# Patient Record
Sex: Male | Born: 1990 | Race: White | Hispanic: No | Marital: Single | Smoking: Never smoker
Health system: Southern US, Community
[De-identification: ages and names within clinical notes are randomized; demographics above are authoritative.]

## PROBLEM LIST (undated history)

## (undated) DIAGNOSIS — M545 Low back pain: Secondary | ICD-10-CM

## (undated) DIAGNOSIS — M47814 Spondylosis without myelopathy or radiculopathy, thoracic region: Secondary | ICD-10-CM

## (undated) DIAGNOSIS — J310 Chronic rhinitis: Secondary | ICD-10-CM

## (undated) DIAGNOSIS — M7918 Myalgia, other site: Secondary | ICD-10-CM

## (undated) DIAGNOSIS — L83 Acanthosis nigricans: Secondary | ICD-10-CM

## (undated) DIAGNOSIS — F909 Attention-deficit hyperactivity disorder, unspecified type: Secondary | ICD-10-CM

## (undated) DIAGNOSIS — Z98811 Dental restoration status: Secondary | ICD-10-CM

## (undated) DIAGNOSIS — M222X1 Patellofemoral disorders, right knee: Secondary | ICD-10-CM

## (undated) DIAGNOSIS — E669 Obesity, unspecified: Secondary | ICD-10-CM

## (undated) DIAGNOSIS — G4700 Insomnia, unspecified: Secondary | ICD-10-CM

## (undated) DIAGNOSIS — R942 Abnormal results of pulmonary function studies: Secondary | ICD-10-CM

## (undated) DIAGNOSIS — E785 Hyperlipidemia, unspecified: Secondary | ICD-10-CM

## (undated) DIAGNOSIS — G894 Chronic pain syndrome: Secondary | ICD-10-CM

## (undated) DIAGNOSIS — G8929 Other chronic pain: Secondary | ICD-10-CM

## (undated) DIAGNOSIS — M222X2 Patellofemoral disorders, left knee: Secondary | ICD-10-CM

## (undated) DIAGNOSIS — R079 Chest pain, unspecified: Secondary | ICD-10-CM

## (undated) DIAGNOSIS — Z0000 Encounter for general adult medical examination without abnormal findings: Secondary | ICD-10-CM

## (undated) DIAGNOSIS — E119 Type 2 diabetes mellitus without complications: Secondary | ICD-10-CM

## (undated) DIAGNOSIS — L84 Corns and callosities: Secondary | ICD-10-CM

## (undated) DIAGNOSIS — G4726 Circadian rhythm sleep disorder, shift work type: Secondary | ICD-10-CM

## (undated) DIAGNOSIS — E781 Pure hyperglyceridemia: Secondary | ICD-10-CM

## (undated) DIAGNOSIS — L0591 Pilonidal cyst without abscess: Secondary | ICD-10-CM

## (undated) DIAGNOSIS — L988 Other specified disorders of the skin and subcutaneous tissue: Secondary | ICD-10-CM

## (undated) DIAGNOSIS — G43909 Migraine, unspecified, not intractable, without status migrainosus: Secondary | ICD-10-CM

## (undated) HISTORY — DX: Insomnia, unspecified: G47.00

## (undated) HISTORY — DX: Chest pain, unspecified: R07.9

## (undated) HISTORY — DX: Pure hyperglyceridemia: E78.1

## (undated) HISTORY — DX: Patellofemoral disorders, right knee: M22.2X1

## (undated) HISTORY — DX: Obesity, unspecified: E66.9

## (undated) HISTORY — DX: Myalgia, other site: M79.18

## (undated) HISTORY — DX: Patellofemoral disorders, left knee: M22.2X2

## (undated) HISTORY — DX: Attention-deficit hyperactivity disorder, unspecified type: F90.9

## (undated) HISTORY — DX: Chronic pain syndrome: G89.4

## (undated) HISTORY — DX: Acanthosis nigricans: L83

## (undated) HISTORY — DX: Type 2 diabetes mellitus without complications: E11.9

## (undated) HISTORY — DX: Other specified disorders of the skin and subcutaneous tissue: L98.8

## (undated) HISTORY — DX: Encounter for general adult medical examination without abnormal findings: Z00.00

## (undated) HISTORY — DX: Corns and callosities: L84

## (undated) HISTORY — DX: Circadian rhythm sleep disorder, shift work type: G47.26

## (undated) HISTORY — DX: Migraine, unspecified, not intractable, without status migrainosus: G43.909

## (undated) HISTORY — DX: Abnormal results of pulmonary function studies: R94.2

## (undated) HISTORY — DX: Low back pain: M54.5

## (undated) HISTORY — DX: Chronic rhinitis: J31.0

## (undated) HISTORY — PX: NO PAST SURGERIES: SHX2092

## (undated) HISTORY — DX: Spondylosis without myelopathy or radiculopathy, thoracic region: M47.814

## (undated) HISTORY — DX: Other chronic pain: G89.29

---
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
## 2012-10-08 DIAGNOSIS — E785 Hyperlipidemia, unspecified: Secondary | ICD-10-CM | POA: Insufficient documentation

## 2012-11-19 ENCOUNTER — Encounter: Payer: Self-pay | Admitting: Sports Medicine

## 2012-11-19 ENCOUNTER — Ambulatory Visit (INDEPENDENT_AMBULATORY_CARE_PROVIDER_SITE_OTHER): Payer: BC Managed Care – PPO | Admitting: Sports Medicine

## 2012-11-19 VITALS — BP 134/80 | HR 95 | Wt 228.0 lb

## 2012-11-19 DIAGNOSIS — L03317 Cellulitis of buttock: Secondary | ICD-10-CM

## 2012-11-19 DIAGNOSIS — L0231 Cutaneous abscess of buttock: Secondary | ICD-10-CM

## 2012-11-19 DIAGNOSIS — E785 Hyperlipidemia, unspecified: Secondary | ICD-10-CM

## 2012-11-19 DIAGNOSIS — E781 Pure hyperglyceridemia: Secondary | ICD-10-CM

## 2012-11-19 DIAGNOSIS — L988 Other specified disorders of the skin and subcutaneous tissue: Secondary | ICD-10-CM | POA: Insufficient documentation

## 2012-11-19 DIAGNOSIS — F909 Attention-deficit hyperactivity disorder, unspecified type: Secondary | ICD-10-CM | POA: Insufficient documentation

## 2012-11-19 DIAGNOSIS — Z299 Encounter for prophylactic measures, unspecified: Secondary | ICD-10-CM | POA: Insufficient documentation

## 2012-11-19 HISTORY — DX: Other specified disorders of the skin and subcutaneous tissue: L98.8

## 2012-11-19 HISTORY — DX: Pure hyperglyceridemia: E78.1

## 2012-11-19 MED ORDER — DOXYCYCLINE HYCLATE 100 MG PO TABS: 100.0000 mg | ORAL_TABLET | Freq: Two times a day (BID) | ORAL | Status: AC

## 2012-11-19 NOTE — Assessment & Plan Note (Signed)
Continue Strattera.

## 2012-11-19 NOTE — Progress Notes (Signed)
  Subjective:    CC: Establish care.   HPI:  ADHD: Well-controlled with Strattera. Does not need refills.  Boil: Localized on the right gluteal fold, painful, pain is moderate, localized, does not radiate, stable, has been on an antibiotic prescribed by another provider, he is unaware of which one it is, it was for 10 days, and provided no improvement.  Past medical history, Surgical history, Family history not pertinant except as noted below, Social history, Allergies, and medications have been entered into the medical record, reviewed, and no changes needed.   Review of Systems: No headache, visual changes, nausea, vomiting, diarrhea, constipation, dizziness, abdominal pain, skin rash, fevers, chills, night sweats, swollen lymph nodes, weight loss, chest pain, body aches, joint swelling, muscle aches, shortness of breath, mood changes, visual or auditory hallucinations.  Objective:    General: Well Developed, well nourished, and in no acute distress.  Neuro: Alert and oriented x3, extra-ocular muscles intact, sensation grossly intact.  HEENT: Normocephalic, atraumatic, pupils equal round reactive to light, neck supple, no masses, no lymphadenopathy, thyroid nonpalpable.  Skin: Warm and dry, no rashes noted.  There is a 3 cm boil located on his upper right gluteal fold, it is not fluctuant, not indurated, minimally warm, and draining slightly. Cardiac: Regular rate and rhythm, no murmurs rubs or gallops.  Respiratory: Clear to auscultation bilaterally. Not using accessory muscles, speaking in full sentences.  Abdominal: Soft, nontender, nondistended, positive bowel sounds, no masses, no organomegaly.  Musculoskeletal: Shoulder, elbow, wrist, hip, knee, ankle stable, and with full range of motion.  Impression and Recommendations:    The patient was counselled, risk factors were discussed, anticipatory guidance given.

## 2012-11-19 NOTE — Assessment & Plan Note (Signed)
Has already finished 10 days of antibiotics but it sounds like none offered MRSA coverage. Doxycycline for 14 days, topical wound care.  Return in 2 weeks.

## 2012-11-19 NOTE — Assessment & Plan Note (Signed)
Checking routine bloodwork. 

## 2012-11-19 NOTE — Assessment & Plan Note (Signed)
Rechecking lipid panel 

## 2012-11-19 NOTE — Patient Instructions (Addendum)
Abscess An abscess is an infected area that contains a collection of pus and debris. It can occur in almost any part of the body. An abscess is also known as a furuncle or boil. CAUSES   An abscess occurs when tissue gets infected. This can occur from blockage of oil or sweat glands, infection of hair follicles, or a minor injury to the skin. As the body tries to fight the infection, pus collects in the area and creates pressure under the skin. This pressure causes pain. People with weakened immune systems have difficulty fighting infections and get certain abscesses more often.   SYMPTOMS Usually an abscess develops on the skin and becomes a painful mass that is red, warm, and tender. If the abscess forms under the skin, you may feel a moveable soft area under the skin. Some abscesses break open (rupture) on their own, but most will continue to get worse without care. The infection can spread deeper into the body and eventually into the bloodstream, causing you to feel ill.   DIAGNOSIS   Your caregiver will take your medical history and perform a physical exam. A sample of fluid may also be taken from the abscess to determine what is causing your infection. TREATMENT   Your caregiver may prescribe antibiotic medicines to fight the infection. However, taking antibiotics alone usually does not cure an abscess. Your caregiver may need to make a small cut (incision) in the abscess to drain the pus. In some cases, gauze is packed into the abscess to reduce pain and to continue draining the area. HOME CARE INSTRUCTIONS    Only take over-the-counter or prescription medicines for pain, discomfort, or fever as directed by your caregiver.   If you were prescribed antibiotics, take them as directed. Finish them even if you start to feel better.   If gauze is used, follow your caregiver's directions for changing the gauze.   To avoid spreading the infection:   Keep your draining abscess covered with a  bandage.   Wash your hands well.   Do not share personal care items, towels, or whirlpools with others.   Avoid skin contact with others.   Keep your skin and clothes clean around the abscess.   Keep all follow-up appointments as directed by your caregiver.  SEEK MEDICAL CARE IF:    You have increased pain, swelling, redness, fluid drainage, or bleeding.   You have muscle aches, chills, or a general ill feeling.   You have a fever.  MAKE SURE YOU:    Understand these instructions.   Will watch your condition.   Will get help right away if you are not doing well or get worse.  Document Released: 03/29/2005 Document Revised: 12/19/2011 Document Reviewed: 09/01/2011 Exit[employer] Patient Information 2013 [employer].    

## 2012-11-26 ENCOUNTER — Other Ambulatory Visit: Payer: Self-pay | Admitting: Sports Medicine

## 2012-11-26 LAB — LIPID PANEL
Cholesterol: 198 mg/dL (ref 0–200)
HDL: 33 mg/dL — ABNORMAL LOW (ref >39)
LDL Cholesterol: 135 mg/dL — ABNORMAL HIGH (ref 0–99)
Total CHOL/HDL Ratio: 6 ratio
Triglycerides: 151 mg/dL — ABNORMAL HIGH (ref <150)
VLDL: 30 mg/dL (ref 0–40)

## 2012-11-26 LAB — COMPREHENSIVE METABOLIC PANEL WITH GFR
ALT: 55 U/L — ABNORMAL HIGH (ref 0–53)
AST: 36 U/L (ref 0–37)
Alkaline Phosphatase: 50 U/L (ref 39–117)
BUN: 13 mg/dL (ref 6–23)
Calcium: 10.1 mg/dL (ref 8.4–10.5)
Chloride: 106 meq/L (ref 96–112)
Creat: 0.96 mg/dL (ref 0.50–1.35)
Potassium: 4.6 meq/L (ref 3.5–5.3)

## 2012-11-26 LAB — COMPREHENSIVE METABOLIC PANEL
Albumin: 4.7 g/dL (ref 3.5–5.2)
CO2: 27 mEq/L (ref 19–32)
Glucose, Bld: 122 mg/dL — ABNORMAL HIGH (ref 70–99)
Sodium: 141 mEq/L (ref 135–145)
Total Bilirubin: 0.8 mg/dL (ref 0.3–1.2)
Total Protein: 7.7 g/dL (ref 6.0–8.3)

## 2012-11-26 LAB — CBC
HCT: 47.4 % (ref 39.0–52.0)
Hemoglobin: 16.2 g/dL (ref 13.0–17.0)
MCH: 30.9 pg (ref 26.0–34.0)
MCHC: 34.2 g/dL (ref 30.0–36.0)
MCV: 90.3 fL (ref 78.0–100.0)
Platelets: 288 K/uL (ref 150–400)
RBC: 5.25 MIL/uL (ref 4.22–5.81)
RDW: 13 % (ref 11.5–15.5)
WBC: 7.2 K/uL (ref 4.0–10.5)

## 2012-11-26 LAB — TSH: TSH: 3.171 u[IU]/mL (ref 0.350–4.500)

## 2012-11-27 LAB — VITAMIN D 25 HYDROXY (VIT D DEFICIENCY, FRACTURES): Vit D, 25-Hydroxy: 28 ng/mL — ABNORMAL LOW (ref 30–89)

## 2012-11-27 MED ORDER — VITAMIN D (ERGOCALCIFEROL) 1.25 MG (50000 UNIT) PO CAPS: 50000.0000 [IU] | ORAL_CAPSULE | ORAL | Status: DC

## 2012-11-27 NOTE — Addendum Note (Signed)
Addended by: Monica Becton on: 11/27/2012 08:47 AM   Modules accepted: Orders

## 2012-12-03 ENCOUNTER — Ambulatory Visit (INDEPENDENT_AMBULATORY_CARE_PROVIDER_SITE_OTHER): Payer: BC Managed Care – PPO | Admitting: Sports Medicine

## 2012-12-03 ENCOUNTER — Encounter: Payer: Self-pay | Admitting: Sports Medicine

## 2012-12-03 VITALS — BP 140/87 | HR 111

## 2012-12-03 DIAGNOSIS — L988 Other specified disorders of the skin and subcutaneous tissue: Secondary | ICD-10-CM

## 2012-12-03 DIAGNOSIS — R030 Elevated blood-pressure reading, without diagnosis of hypertension: Secondary | ICD-10-CM

## 2012-12-03 MED ORDER — IBUPROFEN 800 MG PO TABS: 800.0000 mg | ORAL_TABLET | Freq: Three times a day (TID) | ORAL | Status: DC | PRN

## 2012-12-03 NOTE — Patient Instructions (Addendum)
Pilonidal Cyst A pilonidal cyst occurs when hairs get trapped (ingrown) beneath the skin in the crease between the buttocks over your sacrum (the bone under that crease). Pilonidal cysts are most common in young men with a lot of body hair. When the cyst is ruptured (breaks) or leaking, fluid from the cyst may cause burning and itching. If the cyst becomes infected, it causes a painful swelling filled with pus (abscess). The pus and trapped hairs need to be removed (often by lancing) so that the infection can heal. However, recurrence is common and an operation may be needed to remove the cyst. HOME CARE INSTRUCTIONS   If the cyst was NOT INFECTED:  Keep the area clean and dry. Bathe or shower daily. Wash the area well with a germ-killing soap. Warm tub baths may help prevent infection and help with drainage. Dry the area well with a towel.  Avoid tight clothing to keep area as moisture free as possible.  Keep area between buttocks as free of hair as possible. A depilatory may be used.  If the cyst WAS INFECTED and needed to be drained:  Your caregiver packed the wound with gauze to keep the wound open. This allows the wound to heal from the inside outwards and continue draining.  Return for a wound check in 1 day or as suggested.  If you take tub baths or showers, repack the wound with gauze following them. Sponge baths (at the sink) are a good alternative.  If an antibiotic was ordered to fight the infection, take as directed.  Only take over-the-counter or prescription medicines for pain, discomfort, or fever as directed by your caregiver.  After the drain is removed, use sitz baths for 20 minutes 4 times per day. Clean the wound gently with mild unscented soap, pat dry, and then apply a dry dressing. SEEK MEDICAL CARE IF:   You have increased pain, swelling, redness, drainage, or bleeding from the area.  You have a fever.  You have muscles aches, dizziness, or a general ill  feeling. Document Released: 06/16/2000 Document Revised: 09/11/2011 Document Reviewed: 08/14/2008 Exit[employer] Patient Information 2014 [employer].  

## 2012-12-03 NOTE — Assessment & Plan Note (Signed)
I did remove some ingrown hairs from the top of the gluteal cleft. He is improved significantly with antibiotics. I would like to see him back in one month, if symptoms continue to be persistent, I will send him to Gen. surgery for consideration of excision.

## 2012-12-03 NOTE — Assessment & Plan Note (Signed)
Recheck at the next visit 

## 2012-12-03 NOTE — Progress Notes (Signed)
  Subjective:    CC: Followup Abscess.  HPI: This pleasant male comes in to f/u buttock abscess. I treated him with 14 days of doxycycline.  He still feels pain but it is improved.  Still with occasional drainage.  Pain is localized, doesn't radiate, moderate, improving.  Past medical history, Surgical history, Family history not pertinant except as noted below, Social history, Allergies, and medications have been entered into the medical record, reviewed, and no changes needed.   Review of Systems: No fevers, chills, night sweats, weight loss, chest pain, or shortness of breath.   Objective:    General: Well Developed, well nourished, and in no acute distress.  Neuro: Alert and oriented x3, extra-ocular muscles intact, sensation grossly intact.  HEENT: Normocephalic, atraumatic, pupils equal round reactive to light, neck supple, no masses, no lymphadenopathy, thyroid nonpalpable.  Skin: Warm and dry, no rashes. The abscess is firm, not fluctuant, erythema is resolved. There are still open crypts at the superior gluteal cleft, hair was present and was removed. Cardiac: Regular rate and rhythm, no murmurs rubs or gallops, no lower extremity edema.  Respiratory: Clear to auscultation bilaterally. Not using accessory muscles, speaking in full sentences.  Impression and Recommendations:   I spent 25 minutes with this patient, greater than 50% was face to time counseling regarding pilonidal disease.

## 2013-01-02 ENCOUNTER — Other Ambulatory Visit: Payer: Self-pay | Admitting: Sports Medicine

## 2013-01-06 ENCOUNTER — Ambulatory Visit: Payer: BC Managed Care – PPO | Admitting: Sports Medicine

## 2013-01-09 ENCOUNTER — Ambulatory Visit (INDEPENDENT_AMBULATORY_CARE_PROVIDER_SITE_OTHER): Payer: BC Managed Care – PPO | Admitting: Sports Medicine

## 2013-01-09 ENCOUNTER — Encounter: Payer: Self-pay | Admitting: Sports Medicine

## 2013-01-09 VITALS — BP 141/81 | HR 104 | Wt 233.0 lb

## 2013-01-09 DIAGNOSIS — L988 Other specified disorders of the skin and subcutaneous tissue: Secondary | ICD-10-CM

## 2013-01-09 DIAGNOSIS — F909 Attention-deficit hyperactivity disorder, unspecified type: Secondary | ICD-10-CM

## 2013-01-09 DIAGNOSIS — I10 Essential (primary) hypertension: Secondary | ICD-10-CM

## 2013-01-09 MED ORDER — ATOMOXETINE HCL 80 MG PO CAPS: 80.0000 mg | ORAL_CAPSULE | Freq: Every day | ORAL | Status: DC

## 2013-01-09 NOTE — Assessment & Plan Note (Signed)
Persistence despite antibiotics. At this point I am going to refer him to Gen. surgery.

## 2013-01-09 NOTE — Progress Notes (Signed)
  Subjective:    CC: Followup  HPI: Pilonidal disease: Improved slightly with doxycycline but worsening again.  ADHD: Needs a refill on Strattera, well controlled.  Elevated blood pressure: Continues to be elevated, no symptoms.  Past medical history, Surgical history, Family history not pertinant except as noted below, Social history, Allergies, and medications have been entered into the medical record, reviewed, and no changes needed.   Review of Systems: No fevers, chills, night sweats, weight loss, chest pain, or shortness of breath.   Objective:    General: Well Developed, well nourished, and in no acute distress.  Neuro: Alert and oriented x3, extra-ocular muscles intact, sensation grossly intact.  HEENT: Normocephalic, atraumatic, pupils equal round reactive to light, neck supple, no masses, no lymphadenopathy, thyroid nonpalpable.  Skin: Warm and dry, no rashes. Cardiac: Regular rate and rhythm, no murmurs rubs or gallops, no lower extremity edema.  Respiratory: Clear to auscultation bilaterally. Not using accessory muscles, speaking in full sentences.  Impression and Recommendations:

## 2013-01-09 NOTE — Assessment & Plan Note (Signed)
Refilling Strattera.

## 2013-01-09 NOTE — Assessment & Plan Note (Signed)
We discussed low-sodium diet. I will give him one month and her blood pressure continued to be elevated we may need to start him on hydrochlorothiazide.

## 2013-01-20 ENCOUNTER — Ambulatory Visit (INDEPENDENT_AMBULATORY_CARE_PROVIDER_SITE_OTHER): Payer: BC Managed Care – PPO | Admitting: General Surgery

## 2013-01-20 ENCOUNTER — Encounter (INDEPENDENT_AMBULATORY_CARE_PROVIDER_SITE_OTHER): Payer: Self-pay | Admitting: General Surgery

## 2013-01-20 VITALS — BP 116/74 | HR 84 | Temp 97.0°F | Resp 16 | Ht 71.0 in | Wt 232.2 lb

## 2013-01-20 DIAGNOSIS — L988 Other specified disorders of the skin and subcutaneous tissue: Secondary | ICD-10-CM

## 2013-01-20 NOTE — Patient Instructions (Signed)
Plan to incise and drain pilonidal cyst

## 2013-01-20 NOTE — Progress Notes (Signed)
Subjective:     Patient ID: Derrick Carter, male   DOB: [birth year], 22 y.o.   MRN: [id number]  HPI We're asked to see the patient in consultation by Dr. Kreg Shropshire to evaluate him for a pilonidal cyst. The patient is a 22 year old white male who has had a swollen area in his gluteal cleft for the last 2 years. It is gradually getting larger and more painful. He denies any drainage or fever.  Review of Systems  Constitutional: Negative.   HENT: Negative.   Eyes: Negative.   Respiratory: Negative.   Cardiovascular: Negative.   Endocrine: Negative.   Genitourinary: Negative.   Musculoskeletal: Negative.   Skin: Positive for wound.  Allergic/Immunologic: Negative.   Neurological: Negative.   Hematological: Negative.   Psychiatric/Behavioral: Negative.        Objective:   Physical Exam  Constitutional: He is oriented to person, place, and time. He appears well-developed and well-nourished.  HENT:  Head: Normocephalic and atraumatic.  Eyes: Conjunctivae and EOM are normal. Pupils are equal, round, and reactive to light.  Neck: Normal range of motion. Neck supple.  Cardiovascular: Normal rate, regular rhythm and normal heart sounds.   Pulmonary/Chest: Effort normal and breath sounds normal.  Abdominal: Soft. Bowel sounds are normal.  Genitourinary:  There is an area of redness and induration in his gluteal cleft with some pitting of the skin  Musculoskeletal: Normal range of motion.  Neurological: He is alert and oriented to person, place, and time.  Skin: Skin is warm and dry.  Psychiatric: He has a normal mood and affect. His behavior is normal.       Assessment:     The patient appears to have a pilonidal cyst in his gluteal cleft area. I would recommend incising and draining this area to get it to heal. He will need to do dressing changes to the open wound until it heals by secondary intention. I have discussed this with him and his family in detail including the risks and  benefits of surgery as well as some of the technical aspects and he understands and wishes to proceed     Plan:     Plan for incision and drainage of pilonidal cyst

## 2013-02-02 ENCOUNTER — Other Ambulatory Visit: Payer: Self-pay | Admitting: Sports Medicine

## 2013-02-04 ENCOUNTER — Ambulatory Visit (INDEPENDENT_AMBULATORY_CARE_PROVIDER_SITE_OTHER): Payer: BC Managed Care – PPO | Admitting: Sports Medicine

## 2013-02-04 ENCOUNTER — Encounter: Payer: Self-pay | Admitting: Sports Medicine

## 2013-02-04 VITALS — BP 120/88 | HR 80 | Wt 228.0 lb

## 2013-02-04 DIAGNOSIS — I10 Essential (primary) hypertension: Secondary | ICD-10-CM

## 2013-02-04 DIAGNOSIS — E785 Hyperlipidemia, unspecified: Secondary | ICD-10-CM

## 2013-02-04 DIAGNOSIS — L988 Other specified disorders of the skin and subcutaneous tissue: Secondary | ICD-10-CM

## 2013-02-04 NOTE — Assessment & Plan Note (Signed)
Lipids are elevated but not bad considering her risk factors. He will work aggressively on a low-cholesterol diet and I'd like to recheck in about 3 months.

## 2013-02-04 NOTE — Progress Notes (Signed)
  Subjective:    CC: Followup  HPI: Obesity: 5 pound weight loss since the last visit.  Hyperlipidemia:  Currently doing dietary modification, due for recheck in about 3 months.  Pilonidal disease: Scheduled for surgical excision next month.  Elevated blood pressure: Resolved.  ADHD: No problems with Strattera.  Past medical history, Surgical history, Family history not pertinant except as noted below, Social history, Allergies, and medications have been entered into the medical record, reviewed, and no changes needed.   Review of Systems: No fevers, chills, night sweats, weight loss, chest pain, or shortness of breath.   Objective:    General: Well Developed, well nourished, and in no acute distress.  Neuro: Alert and oriented x3, extra-ocular muscles intact, sensation grossly intact.  HEENT: Normocephalic, atraumatic, pupils equal round reactive to light, neck supple, no masses, no lymphadenopathy, thyroid nonpalpable.  Skin: Warm and dry, no rashes. Cardiac: Regular rate and rhythm, no murmurs rubs or gallops, no lower extremity edema.  Respiratory: Clear to auscultation bilaterally. Not using accessory muscles, speaking in full sentences. Impression and Recommendations:

## 2013-02-04 NOTE — Patient Instructions (Addendum)

## 2013-02-04 NOTE — Assessment & Plan Note (Signed)
Blood pressure is diet-controlled.

## 2013-02-04 NOTE — Assessment & Plan Note (Signed)
Scheduled for excision with [place] surgery.

## 2013-02-21 ENCOUNTER — Telehealth (INDEPENDENT_AMBULATORY_CARE_PROVIDER_SITE_OTHER): Payer: Self-pay

## 2013-02-21 NOTE — Telephone Encounter (Signed)
Message copied by Brennan Bailey on Fri Feb 21, 2013  4:24 PM ------      Message from: Marin Shutter      Created: Fri Feb 21, 2013 12:35 PM      Regarding: Dr. Alvester Morin: [phone]       Pt's mom, Selena Batten,  called and wanted to know if she could get a nurse to 'clean' his wound after sx on 9/8 (pilo cyst in anus area).  Mom doesn't feel comfortable cleaning the wound.  I guess she is talking about some kind of 'home care'.  Thx ------

## 2013-02-21 NOTE — Telephone Encounter (Signed)
Returned call to [employer]. LMOM. They will have to arrange home health at discharge after surgery. Please advise doctor before discharge.

## 2013-02-24 NOTE — Telephone Encounter (Signed)
Pt's mother returned call and given the information to request home health while at the hospital for his procedure.  She understands.

## 2013-03-03 DIAGNOSIS — L0591 Pilonidal cyst without abscess: Secondary | ICD-10-CM

## 2013-03-03 HISTORY — DX: Pilonidal cyst without abscess: L05.91

## 2013-03-04 ENCOUNTER — Encounter (HOSPITAL_BASED_OUTPATIENT_CLINIC_OR_DEPARTMENT_OTHER): Payer: Self-pay | Admitting: *Deleted

## 2013-03-04 ENCOUNTER — Other Ambulatory Visit: Payer: Self-pay

## 2013-03-04 MED ORDER — ATOMOXETINE HCL 80 MG PO CAPS: ORAL_CAPSULE | ORAL | Status: DC

## 2013-03-10 ENCOUNTER — Telehealth (INDEPENDENT_AMBULATORY_CARE_PROVIDER_SITE_OTHER): Payer: Self-pay | Admitting: *Deleted

## 2013-03-10 ENCOUNTER — Encounter (HOSPITAL_BASED_OUTPATIENT_CLINIC_OR_DEPARTMENT_OTHER): Payer: Self-pay | Admitting: Anesthesiology

## 2013-03-10 ENCOUNTER — Ambulatory Visit (HOSPITAL_BASED_OUTPATIENT_CLINIC_OR_DEPARTMENT_OTHER)
Admission: RE | Admit: 2013-03-10 | Discharge: 2013-03-10 | Disposition: A | Discharge status: Home / Self Care | Payer: BC Managed Care – PPO | Source: Ambulatory Visit | Attending: General Surgery | Admitting: General Surgery

## 2013-03-10 ENCOUNTER — Ambulatory Visit (HOSPITAL_BASED_OUTPATIENT_CLINIC_OR_DEPARTMENT_OTHER): Payer: BC Managed Care – PPO | Admitting: Anesthesiology

## 2013-03-10 ENCOUNTER — Encounter (HOSPITAL_BASED_OUTPATIENT_CLINIC_OR_DEPARTMENT_OTHER)
Admission: RE | Disposition: A | Discharge status: Home / Self Care | Payer: Self-pay | Source: Ambulatory Visit | Attending: General Surgery

## 2013-03-10 ENCOUNTER — Encounter (HOSPITAL_BASED_OUTPATIENT_CLINIC_OR_DEPARTMENT_OTHER): Payer: Self-pay | Admitting: *Deleted

## 2013-03-10 DIAGNOSIS — L0591 Pilonidal cyst without abscess: Secondary | ICD-10-CM | POA: Insufficient documentation

## 2013-03-10 DIAGNOSIS — L988 Other specified disorders of the skin and subcutaneous tissue: Secondary | ICD-10-CM

## 2013-03-10 HISTORY — PX: PILONIDAL CYST EXCISION: SHX744

## 2013-03-10 HISTORY — DX: Pilonidal cyst without abscess: L05.91

## 2013-03-10 HISTORY — DX: Dental restoration status: Z98.811

## 2013-03-10 SURGERY — EXCISION, SIMPLE PILONIDAL CYST
Anesthesia: General | Site: Buttocks | Wound class: Dirty or Infected

## 2013-03-10 MED ORDER — ONDANSETRON HCL 4 MG/2ML IJ SOLN: 4.0000 mg | Freq: Once | INTRAMUSCULAR | Status: DC | PRN

## 2013-03-10 MED ORDER — PROPOFOL 10 MG/ML IV BOLUS
INTRAVENOUS | Status: DC | PRN
  Administered 2013-03-10: 300 mg via INTRAVENOUS

## 2013-03-10 MED ORDER — CHLORHEXIDINE GLUCONATE 4 % EX LIQD: 1.0000 "application " | Freq: Once | CUTANEOUS | Status: DC

## 2013-03-10 MED ORDER — LIDOCAINE HCL (CARDIAC) 20 MG/ML IV SOLN
INTRAVENOUS | Status: DC | PRN
  Administered 2013-03-10: 100 mg via INTRAVENOUS

## 2013-03-10 MED ORDER — LACTATED RINGERS IV SOLN
INTRAVENOUS | Status: DC
  Administered 2013-03-10: 07:00:00 via INTRAVENOUS

## 2013-03-10 MED ORDER — OXYCODONE HCL 5 MG PO TABS: 5.0000 mg | ORAL_TABLET | Freq: Once | ORAL | Status: DC | PRN

## 2013-03-10 MED ORDER — MIDAZOLAM HCL 5 MG/5ML IJ SOLN
INTRAMUSCULAR | Status: DC | PRN
  Administered 2013-03-10: 2 mg via INTRAVENOUS

## 2013-03-10 MED ORDER — CEFAZOLIN SODIUM-DEXTROSE 2-3 GM-% IV SOLR
2.0000 g | INTRAVENOUS | Status: AC
  Administered 2013-03-10: 2 g via INTRAVENOUS

## 2013-03-10 MED ORDER — HYDROMORPHONE HCL PF 1 MG/ML IJ SOLN: 0.2500 mg | INTRAMUSCULAR | Status: DC | PRN

## 2013-03-10 MED ORDER — MIDAZOLAM HCL 2 MG/2ML IJ SOLN: 1.0000 mg | INTRAMUSCULAR | Status: DC | PRN

## 2013-03-10 MED ORDER — FENTANYL CITRATE 0.05 MG/ML IJ SOLN: 50.0000 ug | INTRAMUSCULAR | Status: DC | PRN

## 2013-03-10 MED ORDER — BUPIVACAINE-EPINEPHRINE 0.25% -1:200000 IJ SOLN
INTRAMUSCULAR | Status: DC | PRN
  Administered 2013-03-10: 20 mL

## 2013-03-10 MED ORDER — DEXAMETHASONE SODIUM PHOSPHATE 4 MG/ML IJ SOLN
INTRAMUSCULAR | Status: DC | PRN
  Administered 2013-03-10: 10 mg via INTRAVENOUS

## 2013-03-10 MED ORDER — MIDAZOLAM HCL 2 MG/ML PO SYRP: 12.0000 mg | ORAL_SOLUTION | Freq: Once | ORAL | Status: DC | PRN

## 2013-03-10 MED ORDER — HYDROCODONE-ACETAMINOPHEN 5-325 MG PO TABS: 1.0000 | ORAL_TABLET | ORAL | Status: DC | PRN

## 2013-03-10 MED ORDER — OXYCODONE HCL 5 MG/5ML PO SOLN: 5.0000 mg | Freq: Once | ORAL | Status: DC | PRN

## 2013-03-10 MED ORDER — SUCCINYLCHOLINE CHLORIDE 20 MG/ML IJ SOLN
INTRAMUSCULAR | Status: DC | PRN
  Administered 2013-03-10: 100 mg via INTRAVENOUS

## 2013-03-10 MED ORDER — FENTANYL CITRATE 0.05 MG/ML IJ SOLN
INTRAMUSCULAR | Status: DC | PRN
  Administered 2013-03-10 (×3): 50 ug via INTRAVENOUS

## 2013-03-10 MED ORDER — ONDANSETRON HCL 4 MG/2ML IJ SOLN
INTRAMUSCULAR | Status: DC | PRN
  Administered 2013-03-10: 4 mg via INTRAVENOUS

## 2013-03-10 SURGICAL SUPPLY — 40 items
BANDAGE GAUZE ELAST BULKY 4 IN (GAUZE/BANDAGES/DRESSINGS) IMPLANT
BENZOIN TINCTURE PRP APPL 2/3 (GAUZE/BANDAGES/DRESSINGS) ×2 IMPLANT
BLADE SURG 15 STRL LF DISP TIS (BLADE) ×1 IMPLANT
BLADE SURG 15 STRL SS (BLADE) ×1
BLADE SURG ROTATE 9660 (MISCELLANEOUS) IMPLANT
CANISTER SUCTION 1200CC (MISCELLANEOUS) ×2 IMPLANT
CHLORAPREP W/TINT 26ML (MISCELLANEOUS) IMPLANT
CLEANER CAUTERY TIP 5X5 PAD (MISCELLANEOUS) ×1 IMPLANT
CLOTH BEACON ORANGE TIMEOUT ST (SAFETY) ×2 IMPLANT
COVER MAYO STAND STRL (DRAPES) ×2 IMPLANT
COVER TABLE BACK 60X90 (DRAPES) ×2 IMPLANT
DECANTER SPIKE VIAL GLASS SM (MISCELLANEOUS) IMPLANT
DRAPE LAPAROTOMY T 102X78X121 (DRAPES) ×2 IMPLANT
DRAPE UTILITY XL STRL (DRAPES) ×2 IMPLANT
DRSG PAD ABDOMINAL 8X10 ST (GAUZE/BANDAGES/DRESSINGS) IMPLANT
ELECT REM PT RETURN 9FT ADLT (ELECTROSURGICAL) ×2
ELECTRODE REM PT RTRN 9FT ADLT (ELECTROSURGICAL) ×1 IMPLANT
GAUZE SPONGE 4X4 12PLY STRL LF (GAUZE/BANDAGES/DRESSINGS) ×4 IMPLANT
GAUZE SPONGE 4X4 16PLY XRAY LF (GAUZE/BANDAGES/DRESSINGS) IMPLANT
GLOVE BIO SURGEON STRL SZ 6.5 (GLOVE) ×2 IMPLANT
GLOVE BIO SURGEON STRL SZ7.5 (GLOVE) ×2 IMPLANT
GOWN PREVENTION PLUS XLARGE (GOWN DISPOSABLE) ×2 IMPLANT
GOWN PREVENTION PLUS XXLARGE (GOWN DISPOSABLE) ×2 IMPLANT
NEEDLE HYPO 25X1 1.5 SAFETY (NEEDLE) ×2 IMPLANT
NS IRRIG 1000ML POUR BTL (IV SOLUTION) IMPLANT
PACK BASIN DAY SURGERY FS (CUSTOM PROCEDURE TRAY) ×2 IMPLANT
PAD CLEANER CAUTERY TIP 5X5 (MISCELLANEOUS) ×1
PENCIL BUTTON HOLSTER BLD 10FT (ELECTRODE) ×2 IMPLANT
SPONGE LAP 4X18 X RAY DECT (DISPOSABLE) IMPLANT
STRIP CLOSURE SKIN 1/2X4 (GAUZE/BANDAGES/DRESSINGS) IMPLANT
SUT PROLENE 3 0 PS 2 (SUTURE) IMPLANT
SUT PROLENE 4 0 PS 2 18 (SUTURE) IMPLANT
SUT VICRYL 3-0 CR8 SH (SUTURE) IMPLANT
SYR CONTROL 10ML LL (SYRINGE) ×2 IMPLANT
TAPE CLOTH 3X10 TAN LF (GAUZE/BANDAGES/DRESSINGS) ×2 IMPLANT
TOWEL OR 17X24 6PK STRL BLUE (TOWEL DISPOSABLE) ×2 IMPLANT
TOWEL OR NON WOVEN STRL DISP B (DISPOSABLE) ×2 IMPLANT
TRAY DSU PREP LF (CUSTOM PROCEDURE TRAY) ×2 IMPLANT
TUBE CONNECTING 20X1/4 (TUBING) ×2 IMPLANT
YANKAUER SUCT BULB TIP NO VENT (SUCTIONS) ×2 IMPLANT

## 2013-03-10 NOTE — Transfer of Care (Signed)
Immediate Anesthesia Transfer of Care Note  Patient: Derrick Carter  Procedure(s) Performed: Procedure(s): CYST EXCISION PILONIDAL SIMPLE I and D  (N/A)  Patient Location: PACU  Anesthesia Type:General  Level of Consciousness: awake and alert   Airway & Oxygen Therapy: Patient Spontanous Breathing and Patient connected to face mask oxygen  Post-op Assessment: Report given to PACU RN and Post -op Vital signs reviewed and stable  Post vital signs: Reviewed and stable  Complications: No apparent anesthesia complications

## 2013-03-10 NOTE — Interval H&P Note (Signed)
History and Physical Interval Note:  03/10/2013 7:22 AM  Derrick Carter  has presented today for surgery, with the diagnosis of pilonidal cyst   The various methods of treatment have been discussed with the patient and family. After consideration of risks, benefits and other options for treatment, the patient has consented to  Procedure(s): CYST EXCISION PILONIDAL SIMPLE I and D  (N/A) as a surgical intervention .  The patient's history has been reviewed, patient examined, no change in status, stable for surgery.  I have reviewed the patient's chart and labs.  Questions were answered to the patient's satisfaction.     TOTH III,PAUL S

## 2013-03-10 NOTE — Telephone Encounter (Signed)
Mother called to ask if a home health nurse has been set up to come out for dressing changes for her son.  Explained that I was unsure so a message would be sent to Dr. Carolynne Edouard and Marcelino Duster CMA to ask then we would update her.  Mother states understanding and agreeable at this time.

## 2013-03-10 NOTE — Telephone Encounter (Signed)
I called mother back and let her know the hospital was suppose to take care of getting a HHN out to their house for wound care. Advised home health agencies are only taking referrals from the hospital at this time due to the high demand. Advised if no one comes out th their house by tomorrow afternoon to call our office and have him make a nurse visit for tomorrow fro wet to dry dressing change. She will update [place] tomorrow.

## 2013-03-10 NOTE — Op Note (Signed)
03/10/2013  8:03 AM  PATIENT:  Derrick Carter  22 y.o. male  PRE-OPERATIVE DIAGNOSIS:  pilonidal cyst   POST-OPERATIVE DIAGNOSIS:  pilonidal cyst   PROCEDURE:  Procedure(s): CYST EXCISION PILONIDAL SIMPLE I and D  (N/A)  SURGEON:  Surgeon(s) and Role:    * Robyne Askew, MD - Primary  PHYSICIAN ASSISTANT:   ASSISTANTS: none   ANESTHESIA:   general  EBL:     BLOOD ADMINISTERED:none  DRAINS: none   LOCAL MEDICATIONS USED:  MARCAINE     SPECIMEN:  No Specimen  DISPOSITION OF SPECIMEN:  N/A  COUNTS:  YES  TOURNIQUET:  * No tourniquets in log *  DICTATION: .Dragon Dictation After informed consent was obtained patient brought in the operating room and placed in the supine position on the stretcher. After adequate induction of general anesthesia the patient was moved into a prone position on the operating room table and all pressure points are padded. The patient's buttocks were retracted laterally with tape. The gluteal cleft area was prepped with Betadine and draped in usual sterile manner. The patient had an opening in the skin just to the left of midline as well as some areas of ingrown hair or just the right of midline above this area. When probed with a small silver probe all of these areas connected. The tract was opened sharply with a 15 blade knife. The cavity was opened completely with the electrocautery. Once the entire extent of the cavity was opened as determined by blunt hemostat dissection and probing with the silver probe then hemostasis was achieved using the Bovie electrocautery. All air was removed from the cavity. The granulation tissue was destroyed with the electrocautery. Once this was accomplished the wound was clean. The wound was infiltrated with quarter percent Marcaine and irrigated with saline. The wound was then packed with saline moistened 4 x 4 and sterile dressings were applied. The patient tolerated the procedure well. At the end of the case all needle  sponge and instrument counts were correct. The patient was then awakened and taken to recovery in stable condition.  PLAN OF CARE: Discharge to home after PACU  PATIENT DISPOSITION:  PACU - hemodynamically stable.   Delay start of Pharmacological VTE agent (>24hrs) due to surgical blood loss or risk of bleeding: not applicable

## 2013-03-10 NOTE — Anesthesia Procedure Notes (Signed)
Procedure Name: Intubation Date/Time: 03/10/2013 7:37 AM Performed by: Zenia Resides D Pre-anesthesia Checklist: Patient identified, Emergency Drugs available, Suction available and Patient being monitored Patient Re-evaluated:Patient Re-evaluated prior to inductionOxygen Delivery Method: Circle System Utilized Preoxygenation: Pre-oxygenation with 100% oxygen Intubation Type: IV induction Ventilation: Mask ventilation without difficulty Laryngoscope Size: Mac and 3 Grade View: Grade II Tube type: Oral Number of attempts: 1 Airway Equipment and Method: stylet and oral airway Placement Confirmation: ETT inserted through vocal cords under direct vision,  positive ETCO2 and breath sounds checked- equal and bilateral Secured at: 23 cm Tube secured with: Tape Dental Injury: Teeth and Oropharynx as per pre-operative assessment

## 2013-03-10 NOTE — Anesthesia Postprocedure Evaluation (Signed)
  Anesthesia Post-op Note  Patient: Derrick Carter  Procedure(s) Performed: Procedure(s): CYST EXCISION PILONIDAL SIMPLE I and D  (N/A)  Patient Location: PACU  Anesthesia Type:General  Level of Consciousness: awake, alert  and oriented  Airway and Oxygen Therapy: Patient Spontanous Breathing  Post-op Pain: mild  Post-op Assessment: Post-op Vital signs reviewed  Post-op Vital Signs: Reviewed  Complications: No apparent anesthesia complications

## 2013-03-10 NOTE — Anesthesia Preprocedure Evaluation (Signed)

## 2013-03-10 NOTE — H&P (Signed)
Derrick Carter  01/20/2013 11:30 AM   Office Visit  MRN:  [id number]   Description: 22 year old male  Provider: Robyne Askew, MD  Department: [facility]        Diagnoses    Pilonidal disease    -  Primary    709.8      Reason for Visit    New Evaluation    eval Pilo cyst on inside right side       Reason For Visit History Recorded        Current Vitals - Last Recorded    BP Pulse Temp(Src) Resp Ht Wt    116/74 84 97 F (36.1 C) (Temporal) 16 5\' 11"  (1.803 m) 232 lb 3.2 oz (105.325 kg)       BMI              32.4 kg/m2                 Vitals History Recorded       Progress Notes    Robyne Askew, MD at 01/20/2013 11:44 AM    Status: Signed                   Subjective:       Patient ID: Derrick Carter, male   DOB: 1991-01-23, 22 y.o.   MRN: [id number]   HPI We're asked to see the patient in consultation by Dr. Kreg Shropshire to evaluate him for a pilonidal cyst. The patient is a 22 year old white male who has had a swollen area in his gluteal cleft for the last 2 years. It is gradually getting larger and more painful. He denies any drainage or fever.   Review of Systems  Constitutional: Negative.   HENT: Negative.   Eyes: Negative.   Respiratory: Negative.   Cardiovascular: Negative.   Endocrine: Negative.   Genitourinary: Negative.   Musculoskeletal: Negative.   Skin: Positive for wound.  Allergic/Immunologic: Negative.   Neurological: Negative.   Hematological: Negative.   Psychiatric/Behavioral: Negative.           Objective:     Physical Exam  Constitutional: He is oriented to person, place, and time. He appears well-developed and well-nourished.  HENT:   Head: Normocephalic and atraumatic.  Eyes: Conjunctivae and EOM are normal. Pupils are equal, round, and reactive to light.  Neck: Normal range of motion. Neck supple.  Cardiovascular: Normal rate, regular rhythm and normal heart sounds.   Pulmonary/Chest: Effort normal and  breath sounds normal.  Abdominal: Soft. Bowel sounds are normal.  Genitourinary:  There is an area of redness and induration in his gluteal cleft with some pitting of the skin  Musculoskeletal: Normal range of motion.  Neurological: He is alert and oriented to person, place, and time.  Skin: Skin is warm and dry.  Psychiatric: He has a normal mood and affect. His behavior is normal.          Assessment:       The patient appears to have a pilonidal cyst in his gluteal cleft area. I would recommend incising and draining this area to get it to heal. He will need to do dressing changes to the open wound until it heals by secondary intention. I have discussed this with him and his family in detail including the risks and benefits of surgery as well as some of the technical aspects and he understands and wishes to proceed      Plan:  Plan for incision and drainage of pilonidal cyst

## 2013-03-11 ENCOUNTER — Encounter (HOSPITAL_BASED_OUTPATIENT_CLINIC_OR_DEPARTMENT_OTHER): Payer: Self-pay | Admitting: General Surgery

## 2013-03-11 ENCOUNTER — Ambulatory Visit (INDEPENDENT_AMBULATORY_CARE_PROVIDER_SITE_OTHER): Payer: BC Managed Care – PPO

## 2013-03-11 DIAGNOSIS — Z4800 Encounter for change or removal of nonsurgical wound dressing: Secondary | ICD-10-CM

## 2013-03-11 NOTE — Progress Notes (Signed)
Patient comes in 1 day post op pilonidal excision by Dr Carolynne Edouard. I removed his dressing. His wound looks good. I packed wound with wet 4x4 saline guaze and placed dry 4x4 guaze dressing over wound. Patient states he woke up with red, painful eyes this morning. I looked and his eyes are red. I told him I will let Dr Carolynne Edouard know what is going on with is eyes and call him tomorrow. His mother will call me later this afternoon and give me the name of a home health agency that we can send a referral to to see if they can go out and do daily wet to dry dressing changes for patient. If not, Patient will have to come in to our office for daily dressings. I will await making a follow up appointment until I hear about HHN.

## 2013-03-12 ENCOUNTER — Ambulatory Visit (INDEPENDENT_AMBULATORY_CARE_PROVIDER_SITE_OTHER): Payer: BC Managed Care – PPO

## 2013-03-12 DIAGNOSIS — Z4800 Encounter for change or removal of nonsurgical wound dressing: Secondary | ICD-10-CM

## 2013-03-12 NOTE — Progress Notes (Signed)
Pt here today for dressing change post op pilonidal cystectomy.  Mother and Gearldine Shown are unable to change the dressing.  Dr. Billey Chang nurse will notify pt's mother regarding home health.  Wound packing was removed earlier today by the patient's grandmother.  Repacked with saline gauze and covered with 4x4 and ABD dressing with tape.  Appt made for the pt to return tomorrow.

## 2013-03-13 ENCOUNTER — Encounter (INDEPENDENT_AMBULATORY_CARE_PROVIDER_SITE_OTHER): Payer: Self-pay | Admitting: General Surgery

## 2013-03-13 ENCOUNTER — Ambulatory Visit (INDEPENDENT_AMBULATORY_CARE_PROVIDER_SITE_OTHER): Payer: BC Managed Care – PPO | Admitting: General Surgery

## 2013-03-13 DIAGNOSIS — Z4801 Encounter for change or removal of surgical wound dressing: Secondary | ICD-10-CM

## 2013-03-13 NOTE — Patient Instructions (Signed)
Patient came into the office today for wet to dry dressing changes and I placed a wet 4x4 gauze in the wound and placed a dry guaze over the wound with a abd pad. I told the patient to be here tomorrow at the same time 10:30 for a nurse only viist

## 2013-03-14 ENCOUNTER — Ambulatory Visit (INDEPENDENT_AMBULATORY_CARE_PROVIDER_SITE_OTHER): Payer: BC Managed Care – PPO

## 2013-03-14 DIAGNOSIS — Z4800 Encounter for change or removal of nonsurgical wound dressing: Secondary | ICD-10-CM

## 2013-03-14 NOTE — Progress Notes (Signed)
Patient comes in s/p I and D of Pilonidal cyst on 03/10/2013. The packing had been removed at home by his grandmother, and patient had showered and cleaned wound. I repacked with saline and gauze, placed an ABD pad over top with tape and patient made appointment to come in on Monday. Wound looked pink and healthy, It did not have any drainage and no odor. Patient states that it still hurts pretty bad and that the pain meds make him nausous. RA, CMA

## 2013-03-17 ENCOUNTER — Ambulatory Visit (INDEPENDENT_AMBULATORY_CARE_PROVIDER_SITE_OTHER): Payer: BC Managed Care – PPO

## 2013-03-17 DIAGNOSIS — Z4800 Encounter for change or removal of nonsurgical wound dressing: Secondary | ICD-10-CM

## 2013-03-17 NOTE — Progress Notes (Signed)
Pt comes in today for packing/dressing change post pilo excision.  I removed the existing dressing and packing.  The wound looked good.  I then re-packed with wet to dry gauze.  Pt stated that he wished to have the least amount of tape as possible.  Stated that excessive tape hinders his walking.  I applied a single strip of tape across the buttock.

## 2013-03-18 ENCOUNTER — Encounter (INDEPENDENT_AMBULATORY_CARE_PROVIDER_SITE_OTHER): Payer: Self-pay | Admitting: General Surgery

## 2013-03-18 ENCOUNTER — Ambulatory Visit (INDEPENDENT_AMBULATORY_CARE_PROVIDER_SITE_OTHER): Payer: BC Managed Care – PPO | Admitting: General Surgery

## 2013-03-18 VITALS — BP 130/92 | HR 84 | Temp 98.5°F | Resp 14 | Ht 71.0 in | Wt 232.8 lb

## 2013-03-18 DIAGNOSIS — L988 Other specified disorders of the skin and subcutaneous tissue: Secondary | ICD-10-CM

## 2013-03-18 NOTE — Patient Instructions (Signed)
Continue dressing changes.  ?

## 2013-03-19 ENCOUNTER — Ambulatory Visit (INDEPENDENT_AMBULATORY_CARE_PROVIDER_SITE_OTHER): Payer: BC Managed Care – PPO | Admitting: General Surgery

## 2013-03-19 DIAGNOSIS — Z4801 Encounter for change or removal of surgical wound dressing: Secondary | ICD-10-CM

## 2013-03-19 NOTE — Patient Instructions (Signed)
Patient came in today and I placed a wet 4 x 4 gauze into the wound. And placed several dry gauze on top with some tape. The wound looked good, I made the patient an apt to come back tomorrow for a nurse only visit at 10:30, per his mother

## 2013-03-20 ENCOUNTER — Ambulatory Visit (INDEPENDENT_AMBULATORY_CARE_PROVIDER_SITE_OTHER): Payer: BC Managed Care – PPO

## 2013-03-20 DIAGNOSIS — L988 Other specified disorders of the skin and subcutaneous tissue: Secondary | ICD-10-CM

## 2013-03-20 NOTE — Progress Notes (Signed)
Pt came in for nurse only to have packing redone in the open wound of the pil.cyst. I removed the ABD pad and wiped the area off. The packing was already removed by pt's mom. I repacked the open wound with one gauze wet with normal saline. I recovered the wound with dry gauze and tape. I made another nurse visit for Friday.

## 2013-03-20 NOTE — Progress Notes (Signed)
Subjective:     Patient ID: Derrick Carter, male   DOB: 1991/03/18, 22 y.o.   MRN: [id number]  HPI The patient is a 22 year old white male who is about a week status post incision and drainage of a pilonidal abscess. He has been coming to the clinic every day for a dressing change. He complains of pain in the area. He denies any fevers or chills.  Review of Systems     Objective:   Physical Exam On exam the open wound is relatively clean. There is minimal drainage. I repacked the wound with gauze today and he tolerated this well.    Assessment:     The patient is one-week status post incision and drainage of a pilonidal abscess     Plan:     At this point he will continue to shower daily. I have encouraged his family learn how to do dressing change. We will plan to see him back in about 2 weeks to check the wound.

## 2013-03-21 ENCOUNTER — Ambulatory Visit (INDEPENDENT_AMBULATORY_CARE_PROVIDER_SITE_OTHER): Payer: BC Managed Care – PPO

## 2013-03-21 ENCOUNTER — Encounter (INDEPENDENT_AMBULATORY_CARE_PROVIDER_SITE_OTHER): Payer: Self-pay

## 2013-03-21 VITALS — BP 140/88 | HR 90 | Temp 98.0°F | Resp 18

## 2013-03-21 DIAGNOSIS — Z4800 Encounter for change or removal of nonsurgical wound dressing: Secondary | ICD-10-CM

## 2013-03-21 NOTE — Progress Notes (Signed)
Patient arrived for nurse only pilonidal sinus; afebrile,No serous  Drainage,redness,odor,noted , cleansed pilonidal sinus with hydrogen peroxide and gauze; applied wet to dry dressing; secured with tape. Patient tolerated well. Advised to call if any temp 100.3 or greater,sreous drainage, odor noted; appointment 03-24-13@1030  nurse only dressing change; Patient verbalized understanding

## 2013-03-24 ENCOUNTER — Ambulatory Visit (INDEPENDENT_AMBULATORY_CARE_PROVIDER_SITE_OTHER): Payer: BC Managed Care – PPO

## 2013-03-24 DIAGNOSIS — Z4800 Encounter for change or removal of nonsurgical wound dressing: Secondary | ICD-10-CM

## 2013-03-24 NOTE — Progress Notes (Signed)
Pt comes in today for dressing/packing change post pilo cyst.  Removed existing dressing.  There was no packing to remove.  Pt complained of green/foul smelling drainage over the weekend.  I did not notice this.  The wound looks good.  I then packed with saline soaked gauze and dressed with dry gauze.  I advised pt that if he notices any green/foul smelling drainage to go to urgent care.  (pt states that I could not smell or see the drainage he was talking about because he has showered this morning.)

## 2013-03-25 ENCOUNTER — Ambulatory Visit (INDEPENDENT_AMBULATORY_CARE_PROVIDER_SITE_OTHER): Payer: BC Managed Care – PPO | Admitting: General Surgery

## 2013-03-25 ENCOUNTER — Encounter (INDEPENDENT_AMBULATORY_CARE_PROVIDER_SITE_OTHER): Payer: Self-pay

## 2013-03-25 VITALS — BP 134/100 | HR 106 | Ht 71.0 in | Wt 237.4 lb

## 2013-03-25 DIAGNOSIS — Z4800 Encounter for change or removal of nonsurgical wound dressing: Secondary | ICD-10-CM

## 2013-03-25 NOTE — Progress Notes (Signed)
Patient comes in today s/p pilonidal cyst by Dr.Toth on 03/10/13 for wound check and daily w-t-d dressing change..pt's wound had little drainage but was normal with no odor..wound was healing nicely with zero signs or symptoms of infection were present..I had the mom come in and observe dressing change and gave instructions on how to do these daily..I did not make a nurse only appt for tomorrow as the mom thinks that she can do this...the mother is suppose to call me in the morning after the dressing change is performed..PT is aware of this and agrees...patient will keep is follow up appt with PT on 04/15/13 and they will call should any questions or concerns arise

## 2013-03-26 ENCOUNTER — Telehealth (INDEPENDENT_AMBULATORY_CARE_PROVIDER_SITE_OTHER): Payer: Self-pay | Admitting: *Deleted

## 2013-03-26 NOTE — Telephone Encounter (Signed)
[name] called to let Lawson Fiscal know "I think I got it".  She said she performed to packing change today and thinks she is doing it correctly.  Explained that I will send a message on to Lawson Fiscal to update her.  Encouraged mother to call with any questions she has.  She states understanding and agreeable at this time.

## 2013-03-27 NOTE — Telephone Encounter (Signed)
See below

## 2013-03-28 ENCOUNTER — Ambulatory Visit (INDEPENDENT_AMBULATORY_CARE_PROVIDER_SITE_OTHER): Payer: BC Managed Care – PPO | Admitting: Surgery

## 2013-03-28 ENCOUNTER — Encounter (INDEPENDENT_AMBULATORY_CARE_PROVIDER_SITE_OTHER): Payer: Self-pay | Admitting: Surgery

## 2013-03-28 VITALS — BP 116/70 | HR 72 | Temp 97.8°F | Resp 14 | Ht 71.0 in | Wt 234.6 lb

## 2013-03-28 DIAGNOSIS — L988 Other specified disorders of the skin and subcutaneous tissue: Secondary | ICD-10-CM

## 2013-03-28 DIAGNOSIS — E669 Obesity, unspecified: Secondary | ICD-10-CM

## 2013-03-28 DIAGNOSIS — F909 Attention-deficit hyperactivity disorder, unspecified type: Secondary | ICD-10-CM

## 2013-03-28 HISTORY — DX: Obesity, unspecified: E66.9

## 2013-03-28 NOTE — Patient Instructions (Addendum)
WOUND CARE  It is important that the wound be kept open.   -Keeping the skin edges apart will allow the wound to gradually heal from the base upwards.   - If the skin edges of the wound close too early, a new fluid pocket can form and infection can occur. -This is the reason to pack deeper wounds with gauze or ribbon -This is why drained wounds cannot be sewed closed right away  A healthy wound should form a lining of bright red "beefy" granulating tissue that will help shrink the wound and help the edges grow new skin into it.   -A little mucus / yellow discharge is normal (the body's natural way to try and form a scab) and should be gently washed off with soap and water with daily dressing changes.  -Green or foul smelling drainage implies bacterial colonization and can slow wound healing - a short course of antibiotic ointment (3-5 days) can help it clear up.  Call the doctor if it does not improve or worsens  -Avoid use of antibiotic ointments for more than a week as they can slow wound healing over time.    -Sometimes other wound care products will be used to reduce need for dressing changes and/or help clean up dirty wounds -Sometimes the surgeon needs to debride the wound in the office to remove dead or infected tissue out of the wound so it can heal more quickly and safely.    Change the dressing at least once a day -Wash the wound with mild soap and water gently every day.  It is good to shower or bathe the wound to help it clean out. -Use clean 4x4 gauze for medium/large wounds or ribbon plain NU-gauze for smaller wounds (it does not need to be sterile, just clean) -Keep the raw wound moist with a little saline or KY (saline) gel on the gauze.  -A dry wound will take longer to heal.  -Keep the skin dry around the wound to prevent breakdown and irritation. -Pack the wound down to the base -The goal is to keep the skin apart, not overpack the wound -Use a Q-tip or blunt-tipped kabob  stick toothpick to push the gauze down to the base in narrow or deep wounds   -Cover with a clean gauze and tape -paper or Medipore tape tend to be gentle on the skin -rotate the orientation of the tape to avoid repeated stress/trauma on the skin -using an ACE or Coban wrap on wounds on arms or legs can be used instead.  Complete all antibiotics through the entire prescription to help the infection heal and prevent new places of infection   Returning the see the surgeon is helpful to follow the healing process and help the wound close as fast as possible.  Managing Pain  Pain after surgery or related to activity is often due to strain/injury to muscle, tendon, nerves and/or incisions.  This pain is usually short-term and will improve in a few months.   Many people find it helpful to do the following things TOGETHER to help speed the process of healing and to get back to regular activity more quickly:  1. Avoid heavy physical activity a.  no lifting greater than 20 pounds b. Do not "push through" the pain.  Listen to your body and avoid positions and maneuvers than reproduce the pain c. Walking is okay as tolerated, but go slowly and stop when getting sore.  d. Remember: If it hurts to do  it, then don't do it! 2. Take Anti-inflammatory medication  a. Take with food/snack around the clock for 1-2 weeks i. This helps the muscle and nerve tissues become less irritable and calm down faster b. Choose ONE of the following over-the-counter medications: i. Naproxen 220mg  tabs (ex. Aleve) 1-2 pills twice a day  ii. Ibuprofen 200mg  tabs (ex. Advil, Motrin) 3-4 pills with every meal and just before bedtime iii. Acetaminophen 500mg  tabs (Tylenol) 1-2 pills with every meal and just before bedtime 3. Use a Heating pad or Ice/Cold Pack a. 4-6 times a day b. May use warm bath/hottub  or showers 4. Try Gentle Massage and/or Stretching  a. at the area of pain many times a day b. stop if you feel pain -  do not overdo it  Try these steps together to help you body heal faster and avoid making things get worse.  Doing just one of these things may not be enough.    If you are not getting better after two weeks or are noticing you are getting worse, contact our office for further advice; we may need to re-evaluate you & see what other things we can do to help.    Pilonidal Cyst A pilonidal cyst occurs when hairs get trapped (ingrown) beneath the skin in the crease between the buttocks over your sacrum (the bone under that crease). Pilonidal cysts are most common in young men with a lot of body hair. When the cyst is ruptured (breaks) or leaking, fluid from the cyst may cause burning and itching. If the cyst becomes infected, it causes a painful swelling filled with pus (abscess). The pus and trapped hairs need to be removed (often by lancing) so that the infection can heal. However, recurrence is common and an operation may be needed to remove the cyst. HOME CARE INSTRUCTIONS   If the cyst was NOT INFECTED:  Keep the area clean and dry. Bathe or shower daily. Wash the area well with a germ-killing soap. Warm tub baths may help prevent infection and help with drainage. Dry the area well with a towel.  Avoid tight clothing to keep area as moisture free as possible.  Keep area between buttocks as free of hair as possible. A depilatory may be used.  If the cyst WAS INFECTED and needed to be drained:  Your caregiver packed the wound with gauze to keep the wound open. This allows the wound to heal from the inside outwards and continue draining.  Return for a wound check in 1 day or as suggested.  If you take tub baths or showers, repack the wound with gauze following them. Sponge baths (at the sink) are a good alternative.  If an antibiotic was ordered to fight the infection, take as directed.  Only take over-the-counter or prescription medicines for pain, discomfort, or fever as directed by your  caregiver.  After the drain is removed, use sitz baths for 20 minutes 4 times per day. Clean the wound gently with mild unscented soap, pat dry, and then apply a dry dressing. SEEK MEDICAL CARE IF:   You have increased pain, swelling, redness, drainage, or bleeding from the area.  You have a fever.  You have muscles aches, dizziness, or a general ill feeling. Document Released: 06/16/2000 Document Revised: 09/11/2011 Document Reviewed: 08/14/2008 [facility] Patient Information 2014 [place], [place].

## 2013-03-28 NOTE — Progress Notes (Signed)
Subjective:     Patient ID: Derrick Carter, male   DOB: Jun 07, 1991, 22 y.o.   MRN: [id number]  HPI  Derrick Carter  [birth year] [id number]  Patient Care Team: Monica Becton, MD as PCP - General (Family Medicine)  Procedure (Date: 03/10/2013):  Excision of chronic Pilonidal disease with open wound  This patient returns for surgical re-evaluation.  The patient is now two weeks out.  Has been a challenge for wound care.  Patient cannot reach.  Mother very anxious and does not like to do it.  Therefore, they have been coming to our office for dressing changes.  Mother retrained.  She tried to start doing it.  She noted bleeding today.  It scared her.  She wished for the patient to be seen.  Patient notes that sometimes the wound has a foul smell.  [name] notes that sometimes there is some green mucus associated with it.  There is soreness but not worse.  Moving bowel movements fine.  No new areas of pain  Patient Active Problem List   Diagnosis Date Noted  . Elevated blood pressure (not hypertension) 12/03/2012  . Pilonidal disease s/p I&D 03/10/2013 11/19/2012  . Preventive measure 11/19/2012  . ADHD (attention deficit hyperactivity disorder) 11/19/2012  . Hyperlipidemia 11/19/2012    Past Medical History  Diagnosis Date  . ADHD (attention deficit hyperactivity disorder)   . Pilonidal cyst 03/2013  . Dental crown present     Past Surgical History  Procedure Laterality Date  . No past surgeries    . Pilonidal cyst excision N/A 03/10/2013    Procedure: CYST EXCISION PILONIDAL SIMPLE I and D ;  Surgeon: Robyne Askew, MD;  Location: [facility];  Service: General;  Laterality: N/A;    History   Social History  . Marital Status: Single    Spouse Name: N/A    Number of Children: N/A  . Years of Education: N/A   Occupational History  . Not on file.   Social History Main Topics  . Smoking status: Never Smoker   . Smokeless tobacco: Never Used  . Alcohol  Use: No  . Drug Use: No  . Sexual Activity: No   Other Topics Concern  . Not on file   Social History Narrative  . No narrative on file    Family History  Problem Relation Age of Onset  . Hyperlipidemia Mother   . Diabetes Maternal Grandmother   . Hyperlipidemia Maternal Grandmother   . Hyperlipidemia Maternal Grandfather     Current Outpatient Prescriptions  Medication Sig Dispense Refill  . atomoxetine (STRATTERA) 80 MG capsule TAKE 1 CAPSULE (80 MG TOTAL) BY MOUTH DAILY.  30 capsule  0  . HYDROcodone-acetaminophen (NORCO/VICODIN) 5-325 MG per tablet Take 1-2 tablets by mouth every 4 (four) hours as needed for pain.  50 tablet  1   No current facility-administered medications for this visit.     No Known Allergies  BP 116/70  Pulse 72  Temp(Src) 97.8 F (36.6 C) (Temporal)  Resp 14  Ht 5\' 11"  (1.803 m)  Wt 234 lb 9.6 oz (106.414 kg)  BMI 32.73 kg/m2  No results found.   Review of Systems  Constitutional: Negative for fever, chills and diaphoresis.  HENT: Negative for sore throat, trouble swallowing and neck pain.   Eyes: Negative for photophobia and visual disturbance.  Respiratory: Negative for choking and shortness of breath.   Cardiovascular: Negative for chest pain and palpitations.  Gastrointestinal: Positive for  rectal pain. Negative for nausea, vomiting, diarrhea, constipation, blood in stool, abdominal distention and anal bleeding.  Genitourinary: Negative for dysuria, urgency, difficulty urinating and testicular pain.  Musculoskeletal: Negative for myalgias, arthralgias and gait problem.  Skin: Positive for wound. Negative for color change.  Neurological: Negative for dizziness, speech difficulty, weakness and numbness.  Hematological: Negative for adenopathy.  Psychiatric/Behavioral: Negative for hallucinations, confusion and agitation.       Objective:   Physical Exam  Constitutional: He is oriented to person, place, and time. He appears  well-developed and well-nourished.  Non-toxic appearance. He does not have a sickly appearance. He does not appear ill. No distress.  HENT:  Head: Normocephalic.  Mouth/Throat: Oropharynx is clear and moist. No oropharyngeal exudate.  Eyes: Conjunctivae and EOM are normal. Pupils are equal, round, and reactive to light. No scleral icterus.  Neck: Normal range of motion. No tracheal deviation present.  Cardiovascular: Normal rate, normal heart sounds and intact distal pulses.   Pulmonary/Chest: Effort normal. No respiratory distress.  Abdominal: Soft. He exhibits no distension. There is no tenderness. Hernia confirmed negative in the right inguinal area and confirmed negative in the left inguinal area.  Musculoskeletal: Normal range of motion. He exhibits no tenderness.       Back:  Neurological: He is alert and oriented to person, place, and time. No cranial nerve deficit. He exhibits normal muscle tone. Coordination normal.  Skin: Skin is warm and dry. No rash noted. He is not diaphoretic.  Psychiatric: He has a normal mood and affect. His behavior is normal.       Assessment:     Healing pilonidal wound     Plan:     Reassurance.  I spent some time discussing with the patient and his mother wound care.  Again I stressed washing the area at least every day.  Consider just using KY jelly on the gauze in the wound and avoid saline.  Because of concerns poor order, wash twice or three times a day.  He has a big guy with the wound and a sweaty area.  It is not a surprise that it is hard to keep it perfectly clean and smelling wonderfully.  Avoid peroxide or aggressive soaps.  Avoid scrubbing the wound.  Patient will need continued wound care care until this heals.  No evidence of infection necrosis that requires debridement or antibiotics.  For the most part they seem reassured.  I think they are annoyed that is not healed yet.  I warned it would take some time = 1-37months.  I tried to  reassure him that the worst is past them.  Increase activity as tolerated to regular activity.  Low impact exercise such as walking an hour a day at least ideal.  Do not push through pain.  Diet as tolerated.  Low fat high fiber diet ideal.  Bowel regimen with 30 g fiber a day and fiber supplement as needed to avoid problems.  Return to clinic as scheduled.   Instructions discussed.  Followup with primary care physician for other health issues as would normally be done.  Questions answered.  The patient expressed understanding and appreciation

## 2013-04-14 ENCOUNTER — Other Ambulatory Visit: Payer: Self-pay

## 2013-04-14 MED ORDER — ATOMOXETINE HCL 80 MG PO CAPS: ORAL_CAPSULE | ORAL | Status: DC

## 2013-04-14 NOTE — Telephone Encounter (Signed)
Refill request for Strattera. Rhonda Cunningham,CMA

## 2013-04-15 ENCOUNTER — Encounter (INDEPENDENT_AMBULATORY_CARE_PROVIDER_SITE_OTHER): Payer: Self-pay | Admitting: General Surgery

## 2013-04-15 ENCOUNTER — Ambulatory Visit (INDEPENDENT_AMBULATORY_CARE_PROVIDER_SITE_OTHER): Payer: BC Managed Care – PPO | Admitting: General Surgery

## 2013-04-15 ENCOUNTER — Encounter (INDEPENDENT_AMBULATORY_CARE_PROVIDER_SITE_OTHER): Payer: Self-pay

## 2013-04-15 VITALS — BP 130/78 | HR 76 | Resp 16 | Ht 71.0 in | Wt 234.4 lb

## 2013-04-15 DIAGNOSIS — L988 Other specified disorders of the skin and subcutaneous tissue: Secondary | ICD-10-CM

## 2013-04-15 NOTE — Patient Instructions (Signed)
Continue to pack gauze into wound after showering

## 2013-04-17 NOTE — Progress Notes (Signed)
Subjective:     Patient ID: Derrick Carter, male   DOB: 29-Aug-1990, 22 y.o.   MRN: [id number]  HPI The patient is a 22 year old white male who is one month status post incision and drainage of a pilonidal abscess. His mother has finally learn how to do the dressing changes. He is still having some soreness associated with the wound. He still has a little bit of drainage from the wound as well. He denies any fevers or chills. His appetite is good and his bowels are working normally  Review of Systems     Objective:   Physical Exam On exam the wound is clean with good granulation tissue. The wound is getting much flatter. There is no sign of infection. We redressed the wound today and he tolerated it well.    Assessment:     The patient is one month status post incision and drainage of a pilonidal abscess     Plan:     At this point he will continue to shower daily and change the dressing daily. We will plan to see him back in one month for a wound check

## 2013-05-07 ENCOUNTER — Ambulatory Visit (INDEPENDENT_AMBULATORY_CARE_PROVIDER_SITE_OTHER): Payer: BC Managed Care – PPO | Admitting: Sports Medicine

## 2013-05-07 ENCOUNTER — Encounter: Payer: Self-pay | Admitting: Sports Medicine

## 2013-05-07 VITALS — BP 141/88 | HR 91 | Wt 235.0 lb

## 2013-05-07 DIAGNOSIS — E669 Obesity, unspecified: Secondary | ICD-10-CM

## 2013-05-07 DIAGNOSIS — R030 Elevated blood-pressure reading, without diagnosis of hypertension: Secondary | ICD-10-CM

## 2013-05-07 DIAGNOSIS — Z23 Encounter for immunization: Secondary | ICD-10-CM

## 2013-05-07 DIAGNOSIS — L988 Other specified disorders of the skin and subcutaneous tissue: Secondary | ICD-10-CM

## 2013-05-07 DIAGNOSIS — E785 Hyperlipidemia, unspecified: Secondary | ICD-10-CM

## 2013-05-07 NOTE — Assessment & Plan Note (Signed)
Understands that he is obese. He is not yet ready to consider weight loss strategies. We did discuss the risks and benefits of weight loss. As he is precontemplative, I would like him to think about it and discuss this with me in the future visit.

## 2013-05-07 NOTE — Assessment & Plan Note (Signed)
Rechecking lipids. Not fasting today.

## 2013-05-07 NOTE — Progress Notes (Signed)
  Subjective:    CC: Follow  HPI: Elevated blood pressure: He is in pain from his recent pilonidal cyst excision, and his blood pressure is elevated, no headaches, visual changes, shortness of breath, chest pain.  Hyperlipidemia: Due for a recheck.  Obesity: Is pre-contemplative, has not yet decided whether he wants to work aggressively with me on weight loss or not.  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  Past medical history, Surgical history, Family history not pertinant except as noted below, Social history, Allergies, and medications have been entered into the medical record, reviewed, and no changes needed.   Review of Systems: No fevers, chills, night sweats, weight loss, chest pain, or shortness of breath.   Objective:    General: Well Developed, well nourished, and in no acute distress.  Neuro: Alert and oriented x3, extra-ocular muscles intact, sensation grossly intact.  HEENT: Normocephalic, atraumatic, pupils equal round reactive to light, neck supple, no masses, no lymphadenopathy, thyroid nonpalpable.  Skin: Warm and dry, no rashes. Cardiac: Regular rate and rhythm, no murmurs rubs or gallops, no lower extremity edema.  Respiratory: Clear to auscultation bilaterally. Not using accessory muscles, speaking in full sentences.  Impression and Recommendations:

## 2013-05-07 NOTE — Assessment & Plan Note (Signed)
We will keep an eye on this. Blood pressure was good at the last visit, but he is in pain now.

## 2013-05-07 NOTE — Assessment & Plan Note (Signed)
Still with significant pain status post excision. He will follow this up with his surgeons.

## 2013-05-13 LAB — LIPID PANEL
Cholesterol: 207 mg/dL — ABNORMAL HIGH (ref 0–200)
HDL: 32 mg/dL — ABNORMAL LOW (ref >39)
LDL Cholesterol: 124 mg/dL — ABNORMAL HIGH (ref 0–99)
Total CHOL/HDL Ratio: 6.5 Ratio
Triglycerides: 253 mg/dL — ABNORMAL HIGH (ref <150)
VLDL: 51 mg/dL — ABNORMAL HIGH (ref 0–40)

## 2013-05-14 ENCOUNTER — Encounter (INDEPENDENT_AMBULATORY_CARE_PROVIDER_SITE_OTHER): Payer: Self-pay | Admitting: General Surgery

## 2013-05-14 ENCOUNTER — Ambulatory Visit (INDEPENDENT_AMBULATORY_CARE_PROVIDER_SITE_OTHER): Payer: BC Managed Care – PPO | Admitting: General Surgery

## 2013-05-14 ENCOUNTER — Encounter (INDEPENDENT_AMBULATORY_CARE_PROVIDER_SITE_OTHER): Payer: Self-pay

## 2013-05-14 VITALS — BP 116/76 | HR 80 | Resp 14 | Ht 71.0 in | Wt 234.2 lb

## 2013-05-14 DIAGNOSIS — L988 Other specified disorders of the skin and subcutaneous tissue: Secondary | ICD-10-CM

## 2013-05-14 NOTE — Patient Instructions (Signed)
Change dressing twice a day and use peroxide to clean

## 2013-05-14 NOTE — Progress Notes (Signed)
Subjective:     Patient ID: Derrick Carter, male   DOB: 1991-04-22, 22 y.o.   MRN: [id number]  HPI The patient is a 22 year old white male who is 2 months status post incision and drainage of a pilonidal abscess. His mother finally learn how to do dressing changes. He continues to complain of some drainage from the area. He denies any significant pain.  Review of Systems     Objective:   Physical Exam On exam the wound is open with good granulation tissue. There is still some soupiness to the base of the wound.    Assessment:     The patient is 2 months status post incision and drainage of a pilonidal abscess     Plan:     At this point they will continue to shower and get a dressing change. I would like him to increase it to twice a day. Also like him to clean the wound with peroxide every day. I will plan to see him back in one month to check his progress

## 2013-05-17 ENCOUNTER — Other Ambulatory Visit: Payer: Self-pay | Admitting: Sports Medicine

## 2013-05-19 ENCOUNTER — Encounter: Payer: Self-pay | Admitting: Sports Medicine

## 2013-05-19 ENCOUNTER — Ambulatory Visit (INDEPENDENT_AMBULATORY_CARE_PROVIDER_SITE_OTHER): Payer: BC Managed Care – PPO | Admitting: Sports Medicine

## 2013-05-19 VITALS — BP 141/85 | HR 99 | Wt 236.0 lb

## 2013-05-19 DIAGNOSIS — R030 Elevated blood-pressure reading, without diagnosis of hypertension: Secondary | ICD-10-CM

## 2013-05-19 DIAGNOSIS — F909 Attention-deficit hyperactivity disorder, unspecified type: Secondary | ICD-10-CM

## 2013-05-19 DIAGNOSIS — G43909 Migraine, unspecified, not intractable, without status migrainosus: Secondary | ICD-10-CM | POA: Insufficient documentation

## 2013-05-19 DIAGNOSIS — E781 Pure hyperglyceridemia: Secondary | ICD-10-CM

## 2013-05-19 HISTORY — DX: Migraine, unspecified, not intractable, without status migrainosus: G43.909

## 2013-05-19 MED ORDER — NIACIN ER (ANTIHYPERLIPIDEMIC) 1000 MG PO TBCR: 1000.0000 mg | EXTENDED_RELEASE_TABLET | Freq: Every day | ORAL | Status: DC

## 2013-05-19 MED ORDER — TOPIRAMATE 50 MG PO TABS: ORAL_TABLET | ORAL | Status: DC

## 2013-05-19 NOTE — Progress Notes (Signed)
  Subjective:    CC: Followup  HPI: Hypertriglyceridemia: Is resistant to any form of dietary modification, is eager to try medication.  Headaches: Bilateral, throbbing, photophobia, phonophobia, nausea, the last all day. They're not worse in the morning, and he denies any focal symptoms.  ADD: Stable on Strattera.  Past medical history, Surgical history, Family history not pertinant except as noted below, Social history, Allergies, and medications have been entered into the medical record, reviewed, and no changes needed.   Review of Systems: No fevers, chills, night sweats, weight loss, chest pain, or shortness of breath.   Objective:    General: Well Developed, well nourished, and in no acute distress.  Neuro: Alert and oriented x3, extra-ocular muscles intact, sensation grossly intact. Cranial nerves II through XII are intact, motor, sensory, and coordination functions are intact. HEENT: Normocephalic, atraumatic, pupils equal round reactive to light, neck supple, no masses, no lymphadenopathy, thyroid nonpalpable.  Skin: Warm and dry, no rashes. Cardiac: Regular rate and rhythm, no murmurs rubs or gallops, no lower extremity edema.  Respiratory: Clear to auscultation bilaterally. Not using accessory muscles, speaking in full sentences.  Impression and Recommendations:

## 2013-05-19 NOTE — Assessment & Plan Note (Signed)
Still elevated, we will recheck this and treated if it is still elevated once his pilonidal excision has healed.

## 2013-05-19 NOTE — Assessment & Plan Note (Signed)
Symptoms are fairly classic. As he does get these multiple times per month, I am going to start Topamax. Apply to see him back in one month to reevaluate symptoms and frequency of headaches, Topamax should also help him lose some weight.

## 2013-05-19 NOTE — Assessment & Plan Note (Signed)
Persistently elevated the patient was unwilling to do dietary modification. Adding niacin 1000 mg extended release at bedtime. Return to recheck in one month.

## 2013-05-19 NOTE — Assessment & Plan Note (Signed)
Continue Strattera.

## 2013-06-10 ENCOUNTER — Ambulatory Visit (INDEPENDENT_AMBULATORY_CARE_PROVIDER_SITE_OTHER): Payer: BC Managed Care – PPO | Admitting: General Surgery

## 2013-06-10 ENCOUNTER — Encounter (INDEPENDENT_AMBULATORY_CARE_PROVIDER_SITE_OTHER): Payer: Self-pay | Admitting: General Surgery

## 2013-06-10 VITALS — BP 142/82 | HR 100 | Temp 97.2°F | Resp 18 | Ht 71.0 in | Wt 233.0 lb

## 2013-06-10 DIAGNOSIS — L988 Other specified disorders of the skin and subcutaneous tissue: Secondary | ICD-10-CM

## 2013-06-10 NOTE — Progress Notes (Signed)
Subjective:     Patient ID: Derrick Carter, male   DOB: 06-20-1991, 22 y.o.   MRN: [id number]  HPI The patient is a 22 year old white male who is 3 months status post incision and drainage of a pilonidal abscess. He continues to have bloody drainage from the area. He denies any pain.  Review of Systems     Objective:   Physical Exam On exam the cavity appears to be completely filled with granulation tissue that is loose. It almost appears similar to some retained gauze material. The granulation tissue was evacuated as well as it could be and then the raw edges of the wound were treated with silver nitrate until it was completely hemostatic. The wound was then repacked with gauze. Dressings were applied. He tolerated this well.    Assessment:     The patient is 3 months status post incision and drainage of a pilonidal abscess. I treated a lot of the granulation tissue today with silver nitrate to see if that will help the wound heal quicker.     Plan:     I would like to see him back in one week to check the wound again.

## 2013-06-10 NOTE — Patient Instructions (Signed)
Continue daily dressing changes

## 2013-06-17 ENCOUNTER — Encounter (INDEPENDENT_AMBULATORY_CARE_PROVIDER_SITE_OTHER): Payer: Self-pay | Admitting: General Surgery

## 2013-06-17 ENCOUNTER — Ambulatory Visit (INDEPENDENT_AMBULATORY_CARE_PROVIDER_SITE_OTHER): Payer: BC Managed Care – PPO | Admitting: General Surgery

## 2013-06-17 ENCOUNTER — Encounter (INDEPENDENT_AMBULATORY_CARE_PROVIDER_SITE_OTHER): Payer: Self-pay

## 2013-06-17 VITALS — BP 142/82 | HR 76 | Temp 96.8°F | Resp 16 | Ht 71.0 in | Wt 231.0 lb

## 2013-06-17 DIAGNOSIS — L988 Other specified disorders of the skin and subcutaneous tissue: Secondary | ICD-10-CM

## 2013-06-17 NOTE — Patient Instructions (Signed)
Continue dressing changes.  ?

## 2013-06-17 NOTE — Progress Notes (Signed)
Subjective:     Patient ID: COUNCIL MUNGUIA, male   DOB: [birth year], 22 y.o.   MRN: [id number]  HPI The patient is a 22 year old white male who is 3 months status post incision and drainage of pilonidal abscess. He has had some issues with the wound not healing and prominent granulation tissue. We burned some of this granulation tissue last week with silver nitrate.  Review of Systems     Objective:   Physical Exam On exam the wound looks much better than it did last week. We treated some more of the prominent granulation tissue with silver nitrate. He tolerated this well. We repacked the wound.    Assessment:     The patient is 3 months status post incision and drainage of pilonidal abscess     Plan:     At this point he will continue to do daily dressing changes and shower. We will plan to see him back in about 3-4 weeks to check the wound again.

## 2013-06-27 ENCOUNTER — Other Ambulatory Visit: Payer: Self-pay | Admitting: Sports Medicine

## 2013-06-30 ENCOUNTER — Ambulatory Visit: Payer: BC Managed Care – PPO | Admitting: Sports Medicine

## 2013-07-15 ENCOUNTER — Encounter (INDEPENDENT_AMBULATORY_CARE_PROVIDER_SITE_OTHER): Payer: BC Managed Care – PPO | Admitting: General Surgery

## 2013-07-15 ENCOUNTER — Ambulatory Visit: Payer: BC Managed Care – PPO | Admitting: Sports Medicine

## 2013-07-16 ENCOUNTER — Telehealth (INDEPENDENT_AMBULATORY_CARE_PROVIDER_SITE_OTHER): Payer: Self-pay

## 2013-07-16 NOTE — Telephone Encounter (Signed)
Left appt info on VM 

## 2013-07-16 NOTE — Telephone Encounter (Signed)
Message copied by Brennan BaileyBROOKS, Devontae Casasola on Wed Jul 16, 2013  2:11 PM ------      Message from: Nunzio CobbsKEELER, BARBARA B      Created: Tue Jul 15, 2013  9:32 AM      [facility]       Contact: [phone]       Hey Chaslyn Eisen:      This pt is sick with a gi virus. Mom called to cancel today's appt. Next available is 1/30. Could you work him into dr. Billey Changtoth's schedule for sometime NEXT WEEK.            Mom - kim hagan- phone # [phone] ------

## 2013-07-22 ENCOUNTER — Ambulatory Visit (INDEPENDENT_AMBULATORY_CARE_PROVIDER_SITE_OTHER): Payer: BC Managed Care – PPO | Admitting: General Surgery

## 2013-07-22 ENCOUNTER — Encounter (INDEPENDENT_AMBULATORY_CARE_PROVIDER_SITE_OTHER): Payer: Self-pay | Admitting: General Surgery

## 2013-07-22 ENCOUNTER — Ambulatory Visit: Payer: BC Managed Care – PPO | Admitting: Sports Medicine

## 2013-07-22 ENCOUNTER — Encounter (INDEPENDENT_AMBULATORY_CARE_PROVIDER_SITE_OTHER): Payer: Self-pay

## 2013-07-22 VITALS — BP 130/82 | HR 64 | Temp 97.4°F | Resp 14 | Ht 71.0 in | Wt 230.4 lb

## 2013-07-22 DIAGNOSIS — L988 Other specified disorders of the skin and subcutaneous tissue: Secondary | ICD-10-CM

## 2013-07-22 NOTE — Progress Notes (Signed)
Subjective:     Patient ID: Derrick Carter, male   DOB: 06/13/1991, 23 y.o.   MRN: [id number]  HPI The patient is a 23 year old white male who is about 4 months status post incision and drainage of a pilonidal abscess. His course has been complicated by the formation of a excessive amount of granulation tissue. The last time I saw him I treated this with silver nitrate and some of the tissue I was able to remove from the wound at times looks like gauze fibers as if some dressing had been left in the wound. He continues to have some intermittent bleeding from the wound.  Review of Systems     Objective:   Physical Exam On exam the wound seems a little bit smaller than it did last time we saw him. We treated it with more silver nitrate today and remove some of the granulation tissue. We repacked the wound and he tolerated this well    Assessment:     The patient is 4 months status post incision and drainage of a pilonidal abscess     Plan:     At this point we will continue to do twice a day dressing changes and continue to shower daily. Will plan to see him back in one week to recheck the wound.

## 2013-07-22 NOTE — Patient Instructions (Signed)
Continue dressing changes twice a day 

## 2013-07-29 ENCOUNTER — Ambulatory Visit (INDEPENDENT_AMBULATORY_CARE_PROVIDER_SITE_OTHER): Payer: BC Managed Care – PPO | Admitting: Sports Medicine

## 2013-07-29 ENCOUNTER — Ambulatory Visit: Payer: BC Managed Care – PPO | Admitting: Sports Medicine

## 2013-07-29 ENCOUNTER — Encounter: Payer: Self-pay | Admitting: Sports Medicine

## 2013-07-29 VITALS — BP 131/70 | HR 79 | Ht 71.0 in | Wt 231.0 lb

## 2013-07-29 DIAGNOSIS — E781 Pure hyperglyceridemia: Secondary | ICD-10-CM

## 2013-07-29 DIAGNOSIS — E669 Obesity, unspecified: Secondary | ICD-10-CM

## 2013-07-29 DIAGNOSIS — G43909 Migraine, unspecified, not intractable, without status migrainosus: Secondary | ICD-10-CM

## 2013-07-29 DIAGNOSIS — F909 Attention-deficit hyperactivity disorder, unspecified type: Secondary | ICD-10-CM

## 2013-07-29 MED ORDER — ATOMOXETINE HCL 80 MG PO CAPS: 80.0000 mg | ORAL_CAPSULE | Freq: Every day | ORAL | Status: DC

## 2013-07-29 MED ORDER — NIACIN ER (ANTIHYPERLIPIDEMIC) 1000 MG PO TBCR: 1000.0000 mg | EXTENDED_RELEASE_TABLET | Freq: Every day | ORAL | Status: DC

## 2013-07-29 NOTE — Assessment & Plan Note (Signed)
Refilling Strattera. 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

## 2013-07-29 NOTE — Assessment & Plan Note (Signed)
Self discontinued Topamax, he did not go up on the dose, he continued to have migraines. He will restart it and we will continue the up taper.

## 2013-07-29 NOTE — Assessment & Plan Note (Signed)
5 pound weight loss and currently taking niacin. Rechecking triglycerides.

## 2013-07-29 NOTE — Assessment & Plan Note (Signed)
Has lost 5 pounds, congratulated.

## 2013-07-29 NOTE — Progress Notes (Signed)
  Subjective:    CC: Followup.  HPI: Hypertriglyceridemia:  On niacin, losing weight, eager to recheck.  ADHD:  Needs refill on strattera.  Obesity:  Continues to lose weight, accepted to [employer].  Migraines:  Stopped Topamax on his own, he only went up to 100 mg, still had migraines, did not proceed with the higher dose.  Past medical history, Surgical history, Family history not pertinant except as noted below, Social history, Allergies, and medications have been entered into the medical record, reviewed, and no changes needed.   Review of Systems: No fevers, chills, night sweats, weight loss, chest pain, or shortness of breath.   Objective:    General: Well Developed, well nourished, and in no acute distress.  Neuro: Alert and oriented x3, extra-ocular muscles intact, sensation grossly intact.  HEENT: Normocephalic, atraumatic, pupils equal round reactive to light, neck supple, no masses, no lymphadenopathy, thyroid nonpalpable.  Skin: Warm and dry, no rashes. Cardiac: Regular rate and rhythm, no murmurs rubs or gallops, no lower extremity edema.  Respiratory: Clear to auscultation bilaterally. Not using accessory muscles, speaking in full sentences.  Impression and Recommendations:

## 2013-08-01 ENCOUNTER — Encounter (INDEPENDENT_AMBULATORY_CARE_PROVIDER_SITE_OTHER): Payer: Self-pay

## 2013-08-01 ENCOUNTER — Ambulatory Visit (INDEPENDENT_AMBULATORY_CARE_PROVIDER_SITE_OTHER): Payer: BC Managed Care – PPO | Admitting: General Surgery

## 2013-08-01 ENCOUNTER — Encounter (INDEPENDENT_AMBULATORY_CARE_PROVIDER_SITE_OTHER): Payer: Self-pay | Admitting: General Surgery

## 2013-08-01 VITALS — BP 128/72 | HR 76 | Temp 98.0°F | Resp 18 | Ht 71.0 in | Wt 228.0 lb

## 2013-08-01 DIAGNOSIS — L988 Other specified disorders of the skin and subcutaneous tissue: Secondary | ICD-10-CM

## 2013-08-01 LAB — LIPID PANEL

## 2013-08-01 NOTE — Patient Instructions (Signed)
Continue dressing changes twice a day 

## 2013-08-01 NOTE — Progress Notes (Signed)
Subjective:     Patient ID: Derrick Carter, male   DOB: 03/30/1991, 23 y.o.   MRN: [id number]  HPI The patient is a 23 year old white male who is about 4 months status post incision and drainage of pilonidal abscess. His course has been complicated by a lot of hypertrophic granulation tissue. We have been treating this with silver nitrate. He continues to have soreness in his gluteal cleft and some bloody drainage.  Review of Systems     Objective:   Physical Exam On exam the wound looks much better than it has. There is significantly less granulation tissue and the wound is otherwise clean.    Assessment:     The patient is 4 months status post incision and drainage of a pilonidal abscess     Plan:     He will continue to shower daily and change the dressing twice a day. We will plan to see him back in about 2 weeks to check The wound

## 2013-08-08 ENCOUNTER — Other Ambulatory Visit: Payer: Self-pay | Admitting: Sports Medicine

## 2013-08-08 DIAGNOSIS — E781 Pure hyperglyceridemia: Secondary | ICD-10-CM

## 2013-08-09 LAB — LIPID PANEL
Cholesterol: 216 mg/dL — ABNORMAL HIGH (ref 0–200)
HDL: 35 mg/dL — ABNORMAL LOW (ref >39)
LDL Cholesterol: 128 mg/dL — ABNORMAL HIGH (ref 0–99)
Total CHOL/HDL Ratio: 6.2 ratio
Triglycerides: 267 mg/dL — ABNORMAL HIGH (ref <150)
VLDL: 53 mg/dL — ABNORMAL HIGH (ref 0–40)

## 2013-08-12 MED ORDER — FENOFIBRATE 145 MG PO TABS: 145.0000 mg | ORAL_TABLET | Freq: Every day | ORAL | Status: DC

## 2013-08-12 NOTE — Addendum Note (Signed)
Addended by: Monica BectonHEKKEKANDAM, Noralee Dutko J on: 08/12/2013 11:42 AM   Modules accepted: Orders, Medications

## 2013-08-12 NOTE — Assessment & Plan Note (Signed)
Continue to be elevated despite high-dose niacin. Switching to fenofibrate.

## 2013-08-19 ENCOUNTER — Encounter (INDEPENDENT_AMBULATORY_CARE_PROVIDER_SITE_OTHER): Payer: BC Managed Care – PPO | Admitting: General Surgery

## 2013-08-27 ENCOUNTER — Encounter (INDEPENDENT_AMBULATORY_CARE_PROVIDER_SITE_OTHER): Payer: Self-pay | Admitting: General Surgery

## 2013-08-27 ENCOUNTER — Ambulatory Visit (INDEPENDENT_AMBULATORY_CARE_PROVIDER_SITE_OTHER): Payer: BC Managed Care – PPO | Admitting: General Surgery

## 2013-08-27 ENCOUNTER — Telehealth (INDEPENDENT_AMBULATORY_CARE_PROVIDER_SITE_OTHER): Payer: Self-pay

## 2013-08-27 VITALS — BP 120/78 | HR 70 | Resp 16 | Ht 71.0 in | Wt 224.0 lb

## 2013-08-27 DIAGNOSIS — B369 Superficial mycosis, unspecified: Secondary | ICD-10-CM | POA: Insufficient documentation

## 2013-08-27 DIAGNOSIS — L988 Other specified disorders of the skin and subcutaneous tissue: Secondary | ICD-10-CM

## 2013-08-27 MED ORDER — NYSTATIN-TRIAMCINOLONE 100000-0.1 UNIT/GM-% EX OINT: 1.0000 "application " | TOPICAL_OINTMENT | Freq: Three times a day (TID) | CUTANEOUS | Status: DC

## 2013-08-27 NOTE — Patient Instructions (Signed)
Apply antifungal to gluteal cleft area 3 times a day Continue dressing changes

## 2013-08-27 NOTE — Telephone Encounter (Signed)
LMOM> can he come in earlier? Big gaps in Dr Carolynne Edouardoth schedule. Trying to fill them up.

## 2013-08-27 NOTE — Progress Notes (Signed)
Subjective:     Patient ID: Derrick Carter, Derrick Carter   DOB: 09/07/1990, 23 y.o.   MRN: [id number]  HPI The patient is a 23 year old white Derrick Carter who is almost 6 months status post incision and drainage of a pilonidal abscess. His course has been complicated by excessive granulation tissue. He has continued to do his dressing changes. Her last couple weeks he has been having worsening pain and redness around the wound  Review of Systems     Objective:   Physical Exam On exam the wound is much smaller than it was last visit. It is still fairly clean with good granulation tissue. I treated the granulation tissue today with silver nitrate negative as well. He also appears to have a fungal infection of the skin around the wound from the chronic moisture    Assessment:     The patient is about 6 months status post incision and drainage of a pilonidal abscess     Plan:     At this point I think he needs to continue to do the dressing changes twice a day. He needs to keep dry gauze tucked between the gluteal cleft. I will prescribe some nystatin cream to put on the skin around the wound. I will plan to see him back in about 2 weeks to check his progress

## 2013-09-01 ENCOUNTER — Encounter (INDEPENDENT_AMBULATORY_CARE_PROVIDER_SITE_OTHER): Payer: BC Managed Care – PPO | Admitting: General Surgery

## 2013-09-15 ENCOUNTER — Ambulatory Visit (INDEPENDENT_AMBULATORY_CARE_PROVIDER_SITE_OTHER): Payer: BC Managed Care – PPO | Admitting: General Surgery

## 2013-09-15 ENCOUNTER — Encounter (INDEPENDENT_AMBULATORY_CARE_PROVIDER_SITE_OTHER): Payer: Self-pay | Admitting: General Surgery

## 2013-09-15 VITALS — BP 132/80 | HR 76 | Temp 98.4°F | Resp 16 | Ht 71.0 in | Wt 223.0 lb

## 2013-09-15 DIAGNOSIS — L988 Other specified disorders of the skin and subcutaneous tissue: Secondary | ICD-10-CM

## 2013-09-15 NOTE — Progress Notes (Signed)
Subjective:     Patient ID: Derrick Carter, male   DOB: 02/23/1991, 23 y.o.   MRN: [id number]  HPI The patient is a 23 year old white male who is about 6 months status post incision and drainage of a pilonidal abscess. They have been taking care of the wound allowed better since his last visit. He continues to complain of soreness and some bloody drainage at times.  Review of Systems     Objective:   Physical Exam On exam the wound is much smaller and only allows the head of the Q-tip. There is no tunneling. The wound was otherwise clean.    Assessment:     The patient is 6 months status post incision and drainage of a polyp abscess     Plan:     At this point he will continue to shower daily and Pack the wound at least once a day. I will plan to see him back in one month to check his progress

## 2013-09-15 NOTE — Patient Instructions (Signed)
Continue to pack wound and shower daily

## 2013-10-07 ENCOUNTER — Other Ambulatory Visit: Payer: Self-pay | Admitting: Sports Medicine

## 2013-10-20 ENCOUNTER — Encounter (INDEPENDENT_AMBULATORY_CARE_PROVIDER_SITE_OTHER): Payer: BC Managed Care – PPO | Admitting: General Surgery

## 2013-10-21 ENCOUNTER — Encounter (INDEPENDENT_AMBULATORY_CARE_PROVIDER_SITE_OTHER): Payer: BC Managed Care – PPO | Admitting: General Surgery

## 2013-10-27 ENCOUNTER — Ambulatory Visit (INDEPENDENT_AMBULATORY_CARE_PROVIDER_SITE_OTHER): Payer: BC Managed Care – PPO | Admitting: Sports Medicine

## 2013-10-27 ENCOUNTER — Encounter: Payer: Self-pay | Admitting: Sports Medicine

## 2013-10-27 VITALS — BP 137/80 | HR 94 | Ht 71.0 in | Wt 225.0 lb

## 2013-10-27 DIAGNOSIS — G43909 Migraine, unspecified, not intractable, without status migrainosus: Secondary | ICD-10-CM

## 2013-10-27 DIAGNOSIS — E781 Pure hyperglyceridemia: Secondary | ICD-10-CM

## 2013-10-27 NOTE — Assessment & Plan Note (Signed)
Rechecking lipids on fenofibrate. If continue to be elevated, we will add atorvastatin.

## 2013-10-27 NOTE — Progress Notes (Signed)
  Subjective:    CC: Followup  HPI: Migraines: Continued persistent migraines associated with nausea despite 100 mg of Topamax, and NSAIDs. No advanced imaging has been done.  Hypertriglyceridemia: Has now been on TriCor for some time, niacin was ineffective.  Obesity: Continues to attempt to lose weight.  Past medical history, Surgical history, Family history not pertinant except as noted below, Social history, Allergies, and medications have been entered into the medical record, reviewed, and no changes needed.   Review of Systems: No fevers, chills, night sweats, weight loss, chest pain, or shortness of breath.   Objective:    General: Well Developed, well nourished, and in no acute distress.  Neuro: Alert and oriented x3, extra-ocular muscles intact, sensation grossly intact.  HEENT: Normocephalic, atraumatic, pupils equal round reactive to light, neck supple, no masses, no lymphadenopathy, thyroid nonpalpable.  Skin: Warm and dry, no rashes. Cardiac: Regular rate and rhythm, no murmurs rubs or gallops, no lower extremity edema.  Respiratory: Clear to auscultation bilaterally. Not using accessory muscles, speaking in full sentences.  Impression and Recommendations:

## 2013-10-27 NOTE — Assessment & Plan Note (Signed)
Persistent migraines despite Topamax. Referral to neurology.

## 2013-11-03 LAB — COMPREHENSIVE METABOLIC PANEL
ALT: 44 U/L (ref 0–53)
AST: 32 U/L (ref 0–37)
Albumin: 4.7 g/dL (ref 3.5–5.2)
Alkaline Phosphatase: 40 U/L (ref 39–117)
CO2: 28 mEq/L (ref 19–32)
Creat: 1.01 mg/dL (ref 0.50–1.35)
Potassium: 4.3 mEq/L (ref 3.5–5.3)
Sodium: 139 mEq/L (ref 135–145)
Total Bilirubin: 0.7 mg/dL (ref 0.2–1.2)
Total Protein: 7.3 g/dL (ref 6.0–8.3)

## 2013-11-03 LAB — LIPID PANEL
Cholesterol: 173 mg/dL (ref 0–200)
HDL: 33 mg/dL — ABNORMAL LOW (ref >39)
LDL Cholesterol: 112 mg/dL — ABNORMAL HIGH (ref 0–99)
Total CHOL/HDL Ratio: 5.2 ratio
Triglycerides: 140 mg/dL (ref <150)
VLDL: 28 mg/dL (ref 0–40)

## 2013-11-03 LAB — COMPREHENSIVE METABOLIC PANEL WITH GFR
BUN: 15 mg/dL (ref 6–23)
Calcium: 9.6 mg/dL (ref 8.4–10.5)
Chloride: 104 meq/L (ref 96–112)
Glucose, Bld: 147 mg/dL — ABNORMAL HIGH (ref 70–99)

## 2013-11-04 ENCOUNTER — Encounter (INDEPENDENT_AMBULATORY_CARE_PROVIDER_SITE_OTHER): Payer: Self-pay | Admitting: General Surgery

## 2013-11-04 ENCOUNTER — Ambulatory Visit (INDEPENDENT_AMBULATORY_CARE_PROVIDER_SITE_OTHER): Payer: BC Managed Care – PPO | Admitting: General Surgery

## 2013-11-04 VITALS — BP 122/70 | HR 72 | Temp 97.2°F | Ht 71.0 in | Wt 225.0 lb

## 2013-11-04 DIAGNOSIS — B369 Superficial mycosis, unspecified: Secondary | ICD-10-CM

## 2013-11-04 DIAGNOSIS — L988 Other specified disorders of the skin and subcutaneous tissue: Secondary | ICD-10-CM

## 2013-11-04 MED ORDER — NYSTATIN-TRIAMCINOLONE 100000-0.1 UNIT/GM-% EX OINT: 1.0000 "application " | TOPICAL_OINTMENT | Freq: Three times a day (TID) | CUTANEOUS | Status: DC

## 2013-11-04 NOTE — Progress Notes (Signed)
Subjective:     Patient ID: Derrick Carter, male   DOB: 04/28/1991, 23 y.o.   MRN: [id number]  HPI The patient is a 23 year old white male who is about 6 months status post incision and drainage of a pilonidal abscess. His course has been complicated by persistent drainage and a very slow to heal wound.   Review of Systems     Objective:   Physical Exam On exam the wound continues to slowly shrink. There is no evidence of infection. The wound is very shallow with good granulation tissue. I treated with silver nitrate he tolerated it well.    Assessment:     The patient is 6 months status post incision and drainage of pilonidal abscess     Plan:     At this point we will continue to shower twice a day and change the dressing. I will plan to see him back in one month check the wound

## 2013-11-10 ENCOUNTER — Other Ambulatory Visit: Payer: Self-pay | Admitting: Sports Medicine

## 2013-12-02 ENCOUNTER — Ambulatory Visit (INDEPENDENT_AMBULATORY_CARE_PROVIDER_SITE_OTHER): Payer: BC Managed Care – PPO | Admitting: General Surgery

## 2013-12-02 ENCOUNTER — Encounter (INDEPENDENT_AMBULATORY_CARE_PROVIDER_SITE_OTHER): Payer: Self-pay | Admitting: General Surgery

## 2013-12-02 ENCOUNTER — Telehealth (INDEPENDENT_AMBULATORY_CARE_PROVIDER_SITE_OTHER): Payer: Self-pay

## 2013-12-02 VITALS — BP 126/80 | HR 85 | Temp 97.2°F | Ht 71.0 in | Wt 223.0 lb

## 2013-12-02 DIAGNOSIS — L988 Other specified disorders of the skin and subcutaneous tissue: Secondary | ICD-10-CM

## 2013-12-02 NOTE — Patient Instructions (Signed)
Shower daily and keep area clean and dry

## 2013-12-02 NOTE — Progress Notes (Signed)
Subjective:     Patient ID: Derrick Carter, male   DOB: 09/02/1990, 23 y.o.   MRN: [id number]  HPI The patient is a 23 year old white male who is about 9 months status post incision and drainage of a pilonidal abscess. Postoperatively he developed some prominent granulation tissue which interfered with the wound healing. I have been treating this area with silver nitrate and it has been gradually shrinking.  Review of Systems     Objective:   Physical Exam On exam the open wound is much smaller than I have ever seen it. It measures only about 3 mm or so across. I treated it again today with silver nitrate he tolerated this well.    Assessment:     The patient is 9 months status post incision and drainage of a Pilonidal abscess    Plan:     At this point he will continue to shower daily and keep the area clean and dry. I will plan to see him back in one month to check his progress.

## 2013-12-02 NOTE — Telephone Encounter (Signed)
Pt with chronic pilonidal disease seen today by Dr. Carolynne Edouard is calling to ask if it is ok for him to go swimming - ocean and pool.  I told him with an open wound it was definitely not recommended, but I would send a message to Dr. Carolynne Edouard and his nurse.

## 2013-12-05 ENCOUNTER — Telehealth (INDEPENDENT_AMBULATORY_CARE_PROVIDER_SITE_OTHER): Payer: Self-pay

## 2013-12-05 NOTE — Telephone Encounter (Signed)
Pt told no swimming pool.

## 2013-12-30 ENCOUNTER — Encounter (INDEPENDENT_AMBULATORY_CARE_PROVIDER_SITE_OTHER): Payer: BC Managed Care – PPO | Admitting: General Surgery

## 2014-01-18 ENCOUNTER — Other Ambulatory Visit: Payer: Self-pay | Admitting: Sports Medicine

## 2014-01-27 ENCOUNTER — Ambulatory Visit (INDEPENDENT_AMBULATORY_CARE_PROVIDER_SITE_OTHER): Payer: BC Managed Care – PPO | Admitting: General Surgery

## 2014-01-27 ENCOUNTER — Encounter (INDEPENDENT_AMBULATORY_CARE_PROVIDER_SITE_OTHER): Payer: Self-pay | Admitting: General Surgery

## 2014-01-27 VITALS — BP 128/82 | HR 92 | Temp 97.5°F | Ht 71.0 in | Wt 224.0 lb

## 2014-01-27 DIAGNOSIS — L988 Other specified disorders of the skin and subcutaneous tissue: Secondary | ICD-10-CM

## 2014-01-27 MED ORDER — OXYCODONE-ACETAMINOPHEN 5-325 MG PO TABS: 1.0000 | ORAL_TABLET | Freq: Four times a day (QID) | ORAL | Status: DC | PRN

## 2014-01-27 NOTE — Patient Instructions (Signed)
Scrub butt crack daily. After shower put lamisil cream in butt crack with baby powder

## 2014-01-28 NOTE — Progress Notes (Signed)
Subjective:     Patient ID: Derrick Carter, male   DOB: 10/16/1990, 23 y.o.   MRN: [id number]  HPI The patient is a 23 year old white male who is about 10 months status post incision and drainage of a pilonidal abscess. He continues to have tenderness and a little bit of bleeding from the area. He states that he is showering daily but I continue to find a lot of dirt in his gluteal cleft  Review of Systems     Objective:   Physical Exam On exam the wound is almost completely healed. There is only one small separation of the skin in the gluteal cleft. There is no sign of infection. He does have a rash in the gluteal cleft area as well    Assessment:     The patient is 10 months status post incision and drainage of a pilonidal abscess     Plan:     At this point I have recommended that he continue to shower daily and keep the area clean. He can use some Lamisil cream and baby powder to keep the area dry and to help with the rash. I will plan to see him back in one month.

## 2014-02-15 ENCOUNTER — Other Ambulatory Visit: Payer: Self-pay | Admitting: Sports Medicine

## 2014-02-24 ENCOUNTER — Encounter (INDEPENDENT_AMBULATORY_CARE_PROVIDER_SITE_OTHER): Payer: Self-pay | Admitting: General Surgery

## 2014-02-24 ENCOUNTER — Ambulatory Visit (INDEPENDENT_AMBULATORY_CARE_PROVIDER_SITE_OTHER): Payer: BC Managed Care – PPO | Admitting: General Surgery

## 2014-02-24 VITALS — BP 126/74 | HR 80 | Temp 97.5°F | Ht 71.0 in | Wt 223.0 lb

## 2014-02-24 DIAGNOSIS — L988 Other specified disorders of the skin and subcutaneous tissue: Secondary | ICD-10-CM

## 2014-02-24 NOTE — Progress Notes (Signed)
Subjective:     Patient ID: Derrick Carter, male   DOB: [birth year], 23 y.o.   MRN: [id number]  HPI The patient is a 23 year old white male who is about 11 months status post incision and drainage of a pilonidal abscess. His course was complicated by a prolonged time for the wound to heal. Since his last visit he has had no further drainage from the area. He still has some soreness associated with the area.  Review of Systems     Objective:   Physical Exam On exam The pilonidal area has now completely healed. There is no sign of infection. There is no further bleeding of the skin.    Assessment:     The patient has 11 months status post incision and drainage of A pilonidal abscess     Plan:     At this point he may return to normal activities without restriction. I will plan to see him back on a when necessary basis

## 2014-02-24 NOTE — Patient Instructions (Signed)
May return to normal activities 

## 2014-03-06 DIAGNOSIS — G4700 Insomnia, unspecified: Secondary | ICD-10-CM

## 2014-03-06 HISTORY — DX: Insomnia, unspecified: G47.00

## 2014-03-16 ENCOUNTER — Other Ambulatory Visit: Payer: Self-pay | Admitting: Sports Medicine

## 2014-04-10 ENCOUNTER — Emergency Department
Admission: EM | Admit: 2014-04-10 | Discharge: 2014-04-10 | Disposition: A | Discharge status: Home / Self Care | Payer: BC Managed Care – PPO | Source: Home / Self Care | Attending: Emergency Medicine | Admitting: Emergency Medicine

## 2014-04-10 ENCOUNTER — Encounter: Payer: Self-pay | Admitting: Emergency Medicine

## 2014-04-10 DIAGNOSIS — S90821A Blister (nonthermal), right foot, initial encounter: Secondary | ICD-10-CM

## 2014-04-10 MED ORDER — IBUPROFEN 200 MG PO TABS
ORAL_TABLET | ORAL | Status: DC
Start: 2014-04-10 — End: 2014-12-07

## 2014-04-10 NOTE — ED Notes (Signed)
Blister to right foot x last night. Pain with walking.

## 2014-04-12 NOTE — ED Provider Notes (Signed)
CSN: [id number]     Arrival date & time 04/10/14  1644 History   First MD Initiated Contact with Patient 04/10/14 1652     Chief Complaint  Patient presents with  . Blister   (Consider location/radiation/quality/duration/timing/severity/associated sxs/prior Treatment) HPI Mother brings him in.Blister to right foot x last night. Pain with walking. --After further questioning, he does a lot of walking in boots at work and right heel has been painful for several weeks. Pain 8/10. No drainage. Otherwise recalls no injury . Denies fever or chills or nausea or vomiting or chest pain or shortness of breath  Past Medical History  Diagnosis Date  . ADHD (attention deficit hyperactivity disorder)   . Pilonidal cyst 03/2013  . Dental crown present    Past Surgical History  Procedure Laterality Date  . No past surgeries    . Pilonidal cyst excision N/A 03/10/2013    Procedure: CYST EXCISION PILONIDAL SIMPLE I and D ;  Surgeon: Robyne AskewPaul S Toth III, MD;  Location: [facility];  Service: General;  Laterality: N/A;   Family History  Problem Relation Age of Onset  . Hyperlipidemia Mother   . Diabetes Maternal Grandmother   . Hyperlipidemia Maternal Grandmother   . Hyperlipidemia Maternal Grandfather    History  Substance Use Topics  . Smoking status: Never Smoker   . Smokeless tobacco: Never Used  . Alcohol Use: No    Review of Systems  All other systems reviewed and are negative.   Allergies  Review of patient's allergies indicates no known allergies.  Home Medications   Prior to Admission medications   Medication Sig Start Date End Date Taking? Authorizing Provider  fenofibrate (TRICOR) 145 MG tablet Take 1 tablet (145 mg total) by mouth daily. 08/12/13   Monica Bectonhomas J Thekkekandam, MD  ibuprofen (ADVIL,MOTRIN) 200 MG tablet Take three tablets ( 600 milligrams total) every 6 with food as needed for pain. 04/10/14   Lajean Manesavid Massey, MD  nystatin-triamcinolone ointment [facility](MYCOLOG) Apply  1 application topically 3 (three) times daily. 11/04/13   Chevis PrettyPaul Toth III, MD  STRATTERA 80 MG capsule TAKE ONE CAPSULE BY MOUTH EVERY DAY *PT.REQUESTS 30 DAYS AT A TIME* 03/16/14   Monica Bectonhomas J Thekkekandam, MD  topiramate (TOPAMAX) 50 MG tablet One half tab by mouth twice a day for one week then one tab by mouth daily. 05/19/13   Monica Bectonhomas J Thekkekandam, MD   BP 125/79  Pulse 97  Temp(Src) 97.6 F (36.4 C) (Oral)  Resp 14  Ht 5\' 11"  (1.803 m)  Wt 215 lb (97.523 kg)  BMI 30.00 kg/m2  SpO2 98% Physical Exam  Nursing note and vitals reviewed. Constitutional: He is oriented to person, place, and time. He appears well-developed and well-nourished. No distress.  HENT:  Head: Normocephalic and atraumatic.  Eyes: Conjunctivae and EOM are normal. Pupils are equal, round, and reactive to light. No scleral icterus.  Neck: Normal range of motion.  Cardiovascular: Normal rate.   Pulmonary/Chest: Effort normal.  Abdominal: He exhibits no distension.  Musculoskeletal: Normal range of motion.  Right foot: No bony tenderness. Prominent callus on the heel with overlying 1 x 1 cm blister. No bony tenderness No red streaks Otherwise, no soft tissue swelling or erythema or warmth  Neurological: He is alert and oriented to person, place, and time.  Skin: Skin is warm.  Psychiatric: He has a normal mood and affect.    ED Course  Procedures (including critical care time) Labs Review Labs Reviewed - No data  to display  Imaging Review No results found.   MDM   1. Blister of foot without infection, right, initial encounter    Treatment options discussed, as well as risks, benefits, alternatives. Patient and mother voiced understanding and agreement with the following plans: Using sterile prep, and #11 blade I lanced the blister right heel and clear fluid exuded. Polysporin and nonstick dressing and Kerlex applied.--No sign of infection. Wound care discussed. Advised Dr. Margart SicklesScholl's inserts and be sure to  wear shoes and boots that fit well . Advised to followup with podiatrist if not improving or if symptoms recur. Precautions discussed. Red flags discussed. Questions invited and answered. Patient and mother voiced understanding and agreement.      Lajean Manesavid Massey, MD 04/12/14 [phone]

## 2014-04-28 ENCOUNTER — Ambulatory Visit (INDEPENDENT_AMBULATORY_CARE_PROVIDER_SITE_OTHER): Payer: BC Managed Care – PPO | Admitting: Sports Medicine

## 2014-04-28 ENCOUNTER — Encounter: Payer: Self-pay | Admitting: Sports Medicine

## 2014-04-28 VITALS — BP 113/78 | HR 65 | Ht 71.0 in | Wt 215.0 lb

## 2014-04-28 DIAGNOSIS — Z0000 Encounter for general adult medical examination without abnormal findings: Secondary | ICD-10-CM

## 2014-04-28 DIAGNOSIS — Z23 Encounter for immunization: Secondary | ICD-10-CM

## 2014-04-28 DIAGNOSIS — E781 Pure hyperglyceridemia: Secondary | ICD-10-CM

## 2014-04-28 HISTORY — DX: Encounter for general adult medical examination without abnormal findings: Z00.00

## 2014-04-28 NOTE — Assessment & Plan Note (Signed)
Excellent control 5 months ago, rechecking lipids.

## 2014-04-28 NOTE — Progress Notes (Signed)
  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  Pilonidal disease: Doing well post excision.  Hypertriglyceridemia: Currently doing well on fenofibrate, most recent lipid check was 5-6 months ago.  Past medical history, Surgical history, Family history not pertinant except as noted below, Social history, Allergies, and medications have been entered into the medical record, reviewed, and no changes needed.   Review of Systems: No fevers, chills, night sweats, weight loss, chest pain, or shortness of breath.   Objective:    General: Well Developed, well nourished, and in no acute distress.  Neuro: Alert and oriented x3, extra-ocular muscles intact, sensation grossly intact.  HEENT: Normocephalic, atraumatic, pupils equal round reactive to light, neck supple, no masses, no lymphadenopathy, thyroid nonpalpable.  Skin: Warm and dry, no rashes. Cardiac: Regular rate and rhythm, no murmurs rubs or gallops, no lower extremity edema.  Respiratory: Clear to auscultation bilaterally. Not using accessory muscles, speaking in full sentences.  Impression and Recommendations:

## 2014-04-28 NOTE — Assessment & Plan Note (Addendum)
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 Influenza vaccine as above.

## 2014-04-29 ENCOUNTER — Encounter: Payer: Self-pay | Admitting: Sports Medicine

## 2014-04-29 DIAGNOSIS — E119 Type 2 diabetes mellitus without complications: Secondary | ICD-10-CM | POA: Insufficient documentation

## 2014-04-29 HISTORY — DX: Type 2 diabetes mellitus without complications: E11.9

## 2014-04-29 LAB — COMPREHENSIVE METABOLIC PANEL
AST: 26 U/L (ref 0–37)
Albumin: 4.9 g/dL (ref 3.5–5.2)
BUN: 13 mg/dL (ref 6–23)
CO2: 25 mEq/L (ref 19–32)
Calcium: 9.6 mg/dL (ref 8.4–10.5)
Chloride: 102 mEq/L (ref 96–112)
Creat: 0.93 mg/dL (ref 0.50–1.35)
Glucose, Bld: 257 mg/dL — ABNORMAL HIGH (ref 70–99)
Potassium: 3.9 mEq/L (ref 3.5–5.3)

## 2014-04-29 LAB — CBC
HCT: 47.8 % (ref 39.0–52.0)
Hemoglobin: 16.1 g/dL (ref 13.0–17.0)
MCH: 30.7 pg (ref 26.0–34.0)
MCHC: 33.7 g/dL (ref 30.0–36.0)
MCV: 91 fL (ref 78.0–100.0)
Platelets: 311 10*3/uL (ref 150–400)
RBC: 5.25 MIL/uL (ref 4.22–5.81)
RDW: 12.5 % (ref 11.5–15.5)
WBC: 11.4 10*3/uL — ABNORMAL HIGH (ref 4.0–10.5)

## 2014-04-29 LAB — COMPREHENSIVE METABOLIC PANEL WITH GFR
ALT: 43 U/L (ref 0–53)
Alkaline Phosphatase: 53 U/L (ref 39–117)
Sodium: 138 meq/L (ref 135–145)
Total Bilirubin: 0.5 mg/dL (ref 0.2–1.2)
Total Protein: 7.6 g/dL (ref 6.0–8.3)

## 2014-04-29 LAB — LIPID PANEL
Cholesterol: 204 mg/dL — ABNORMAL HIGH (ref 0–200)
HDL: 35 mg/dL — ABNORMAL LOW (ref >39)
LDL Cholesterol: 135 mg/dL — ABNORMAL HIGH (ref 0–99)
Total CHOL/HDL Ratio: 5.8 Ratio
Triglycerides: 170 mg/dL — ABNORMAL HIGH (ref <150)
VLDL: 34 mg/dL (ref 0–40)

## 2014-04-29 LAB — HEMOGLOBIN A1C
Hgb A1c MFr Bld: 10.4 % — ABNORMAL HIGH (ref <5.7)
Mean Plasma Glucose: 252 mg/dL — ABNORMAL HIGH (ref <117)

## 2014-04-29 LAB — TSH: TSH: 3.655 u[IU]/mL (ref 0.350–4.500)

## 2014-05-01 LAB — QUANTIFERON TB GOLD ASSAY (BLOOD)
Interferon Gamma Release Assay: NEGATIVE
Mitogen value: 9.27 IU/mL
Quantiferon Nil Value: 0.01 [IU]/mL
Quantiferon Tb Ag Minus Nil Value: 0 [IU]/mL
TB Ag value: 0.01 IU/mL

## 2014-05-05 ENCOUNTER — Encounter: Payer: Self-pay | Admitting: Sports Medicine

## 2014-05-05 ENCOUNTER — Ambulatory Visit (INDEPENDENT_AMBULATORY_CARE_PROVIDER_SITE_OTHER): Payer: BC Managed Care – PPO | Admitting: Sports Medicine

## 2014-05-05 DIAGNOSIS — Z23 Encounter for immunization: Secondary | ICD-10-CM | POA: Diagnosis not present

## 2014-05-05 DIAGNOSIS — E119 Type 2 diabetes mellitus without complications: Secondary | ICD-10-CM | POA: Diagnosis not present

## 2014-05-05 MED ORDER — METFORMIN HCL 1000 MG PO TABS: ORAL_TABLET | ORAL | Status: DC

## 2014-05-05 MED ORDER — EMPAGLIFLOZIN 25 MG PO TABS: 25.0000 mg | ORAL_TABLET | Freq: Every day | ORAL | Status: DC

## 2014-05-05 NOTE — Assessment & Plan Note (Signed)
New diagnosis of diabetes. Foot exam normal. Pneumococcal vaccine. Go downstairs for urine microalbumin. Starting Jardiance and metformin.

## 2014-05-05 NOTE — Progress Notes (Signed)
  Subjective:    CC: discuss diabetes  HPI: We noted an elevated hemoglobin A1c on recent blood work, no polydipsia, polyphagia, polyuria. Derrick Carter is amenable to starting diabetes medication.  Past medical history, Surgical history, Family history not pertinant except as noted below, Social history, Allergies, and medications have been entered into the medical record, reviewed, and no changes needed.   Review of Systems: No fevers, chills, night sweats, weight loss, chest pain, or shortness of breath.   Objective:    General: Well Developed, well nourished, and in no acute distress.  Neuro: Alert and oriented x3, extra-ocular muscles intact, sensation grossly intact.  HEENT: Normocephalic, atraumatic, pupils equal round reactive to light, neck supple, no masses, no lymphadenopathy, thyroid nonpalpable.  Skin: Warm and dry, no rashes. Cardiac: Regular rate and rhythm, no murmurs rubs or gallops, no lower extremity edema.  Respiratory: Clear to auscultation bilaterally. Not using accessory muscles, speaking in full sentences.  Diabetic Foot Exam Both feet were examined, there are no signs of ulceration or abnormal callus. Nails are unremarkable. Dorsalis pedis and posterior tibial pulses are palpable. Sensation is intact to sharp and monofilament. Shoes are of appropriate fitment.  Impression and Recommendations:    I spent 25 minutes with this patient, greater than 50% was face-to-face time counseling regarding diagnosis of diabetes.

## 2014-05-12 ENCOUNTER — Other Ambulatory Visit: Payer: Self-pay | Admitting: Sports Medicine

## 2014-05-13 LAB — MICROALBUMIN / CREATININE URINE RATIO
Creatinine, Urine: 38.7 mg/dL
Microalb, Ur: <0.2 mg/dL (ref <2.0)

## 2014-06-16 ENCOUNTER — Ambulatory Visit: Payer: BC Managed Care – PPO | Admitting: Sports Medicine

## 2014-06-24 ENCOUNTER — Other Ambulatory Visit: Payer: Self-pay | Admitting: Sports Medicine

## 2014-07-08 ENCOUNTER — Encounter: Payer: Self-pay | Admitting: Sports Medicine

## 2014-07-08 ENCOUNTER — Ambulatory Visit (INDEPENDENT_AMBULATORY_CARE_PROVIDER_SITE_OTHER): Payer: BC Managed Care – PPO | Admitting: Sports Medicine

## 2014-07-08 VITALS — BP 131/76 | HR 76 | Wt 209.0 lb

## 2014-07-08 DIAGNOSIS — E119 Type 2 diabetes mellitus without complications: Secondary | ICD-10-CM

## 2014-07-08 LAB — POCT GLYCOSYLATED HEMOGLOBIN (HGB A1C): Hemoglobin A1C: 7.3

## 2014-07-08 MED ORDER — DAPAGLIFLOZIN PRO-METFORMIN ER 10-1000 MG PO TB24: 1.0000 | ORAL_TABLET | Freq: Every day | ORAL | Status: DC

## 2014-07-08 NOTE — Assessment & Plan Note (Signed)
Hemoccult an A1c has improved significantly however patient and mother would like him to [facility]. We will switch to the max dose, discount coupon given, return in 3 months, recheck hemoglobin A1c at that time.

## 2014-07-08 NOTE — Progress Notes (Signed)
  Subjective:    CC: Follow-up diabetes  HPI: Diabetes mellitus type 2: Still drinking sodas, we did start him on metformin and jardiance at the last visit, hemoglobin A1c dropped from 10.4-7.3. No side effects. He does desire to switch medications to the same one his mother is taking, Xigduo.  Past medical history, Surgical history, Family history not pertinant except as noted below, Social history, Allergies, and medications have been entered into the medical record, reviewed, and no changes needed.   Review of Systems: No fevers, chills, night sweats, weight loss, chest pain, or shortness of breath.   Objective:    General: Well Developed, well nourished, and in no acute distress.  Neuro: Alert and oriented x3, extra-ocular muscles intact, sensation grossly intact.  HEENT: Normocephalic, atraumatic, pupils equal round reactive to light, neck supple, no masses, no lymphadenopathy, thyroid nonpalpable.  Skin: Warm and dry, no rashes. Cardiac: Regular rate and rhythm, no murmurs rubs or gallops, no lower extremity edema.  Respiratory: Clear to auscultation bilaterally. Not using accessory muscles, speaking in full sentences.  Impression and Recommendations:    I spent 25 minutes with this patient, greater than 50% was face-to-face time counseling regarding the below diagnosis.

## 2014-07-14 ENCOUNTER — Other Ambulatory Visit: Payer: Self-pay | Admitting: Sports Medicine

## 2014-07-14 MED ORDER — ATOMOXETINE HCL 80 MG PO CAPS: 80.0000 mg | ORAL_CAPSULE | Freq: Every day | ORAL | Status: DC

## 2014-07-19 ENCOUNTER — Other Ambulatory Visit: Payer: Self-pay | Admitting: Sports Medicine

## 2014-08-04 ENCOUNTER — Other Ambulatory Visit: Payer: Self-pay | Admitting: Sports Medicine

## 2014-08-24 ENCOUNTER — Encounter: Payer: Self-pay | Admitting: Sports Medicine

## 2014-08-24 ENCOUNTER — Ambulatory Visit (INDEPENDENT_AMBULATORY_CARE_PROVIDER_SITE_OTHER): Payer: BLUE CROSS/BLUE SHIELD | Admitting: Sports Medicine

## 2014-08-24 ENCOUNTER — Ambulatory Visit (INDEPENDENT_AMBULATORY_CARE_PROVIDER_SITE_OTHER): Payer: BLUE CROSS/BLUE SHIELD

## 2014-08-24 VITALS — BP 119/71 | HR 77 | Ht 71.0 in | Wt 213.0 lb

## 2014-08-24 DIAGNOSIS — L83 Acanthosis nigricans: Secondary | ICD-10-CM

## 2014-08-24 DIAGNOSIS — M25511 Pain in right shoulder: Secondary | ICD-10-CM | POA: Diagnosis not present

## 2014-08-24 DIAGNOSIS — M25512 Pain in left shoulder: Secondary | ICD-10-CM

## 2014-08-24 HISTORY — DX: Acanthosis nigricans: L83

## 2014-08-24 MED ORDER — MELOXICAM 15 MG PO TABS: ORAL_TABLET | ORAL | Status: DC

## 2014-08-24 MED ORDER — CLOTRIMAZOLE-BETAMETHASONE 1-0.05 % EX CREA: 1.0000 "application " | TOPICAL_CREAM | Freq: Two times a day (BID) | CUTANEOUS | Status: DC

## 2014-08-24 NOTE — Assessment & Plan Note (Signed)
Most likely related to his diabetes. Adding Lotrisone cream. Return in a month to recheck. This is not present in any of his flexural areas. 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

## 2014-08-24 NOTE — Progress Notes (Signed)
  Subjective:    CC: Bilateral shoulder pain  HPI: For the past year Derrick Carter has had bilateral shoulder pain, anterior, right worse than left. Moderate, persistent without radiation.  Skin rash: For the past month, on both heels, somewhat pruritic.  Past medical history, Surgical history, Family history not pertinant except as noted below, Social history, Allergies, and medications have been entered into the medical record, reviewed, and no changes needed.   Review of Systems: No fevers, chills, night sweats, weight loss, chest pain, or shortness of breath.   Objective:    General: Well Developed, well nourished, and in no acute distress.  Neuro: Alert and oriented x3, extra-ocular muscles intact, sensation grossly intact.  HEENT: Normocephalic, atraumatic, pupils equal round reactive to light, neck supple, no masses, no lymphadenopathy, thyroid nonpalpable.  Skin: Warm and dry, there is also thickening, darkening, and acanthosis of the skin on the posterior heels bilaterally without signs of bacterial superinfection. Cardiac: Regular rate and rhythm, no murmurs rubs or gallops, no lower extremity edema.  Respiratory: Clear to auscultation bilaterally. Not using accessory muscles, speaking in full sentences. Bilateral Shoulder: Inspection reveals no abnormalities, atrophy or asymmetry. Palpation is normal with no tenderness over AC joint or bicipital groove. ROM is full in all planes. Rotator cuff strength normal throughout. No signs of impingement with negative Neer and Hawkin's tests, empty can. Speeds and Yergason's tests positive No labral pathology noted with negative Obrien's, negative crank, negative clunk, and good stability. Normal scapular function observed. No painful arc and no drop arm sign. No apprehension sign  Impression and Recommendations:

## 2014-08-24 NOTE — Assessment & Plan Note (Signed)
With a positive speeds and Yergason tests. Clinically represents biceps tendinitis Formal physical therapy, x-rays, meloxicam. Return in one month, injection if no better.

## 2014-08-31 ENCOUNTER — Other Ambulatory Visit: Payer: Self-pay | Admitting: Sports Medicine

## 2014-09-03 LAB — HM DIABETES EYE EXAM

## 2014-09-07 ENCOUNTER — Ambulatory Visit: Payer: BLUE CROSS/BLUE SHIELD | Admitting: Physical Therapy

## 2014-09-08 ENCOUNTER — Ambulatory Visit (INDEPENDENT_AMBULATORY_CARE_PROVIDER_SITE_OTHER): Payer: BC Managed Care – PPO | Admitting: Physical Therapy

## 2014-09-08 ENCOUNTER — Encounter: Payer: Self-pay | Admitting: Physical Therapy

## 2014-09-08 DIAGNOSIS — M25512 Pain in left shoulder: Secondary | ICD-10-CM

## 2014-09-08 DIAGNOSIS — R29898 Other symptoms and signs involving the musculoskeletal system: Secondary | ICD-10-CM | POA: Diagnosis not present

## 2014-09-08 DIAGNOSIS — R293 Abnormal posture: Secondary | ICD-10-CM

## 2014-09-08 DIAGNOSIS — M25511 Pain in right shoulder: Secondary | ICD-10-CM

## 2014-09-08 NOTE — Therapy (Addendum)
[place], [place], [address] Phone: [phone]   Fax:  [phone]  Physical Therapy Evaluation  Patient Details  Name: Derrick Carter MRN: [id number] Date of Birth: 07-09-1990 Referring Provider:  Silverio Decamp,*  Encounter Date: 09/08/2014      PT End of Session - 09/08/14 1128    Visit Number 1   Number of Visits 8   Date for PT Re-Evaluation 10/06/14   PT Start Time 0935   PT Stop Time 1027   PT Time Calculation (min) 52 min   Activity Tolerance Patient limited by pain      Past Medical History  Diagnosis Date  . ADHD (attention deficit hyperactivity disorder)   . Pilonidal cyst 03/2013  . Dental crown present     Past Surgical History  Procedure Laterality Date  . No past surgeries    . Pilonidal cyst excision N/A 03/10/2013    Procedure: CYST EXCISION PILONIDAL SIMPLE I and D ;  Surgeon: Merrie Roof, MD;  Location: [facility];  Service: General;  Laterality: N/A;    There were no vitals taken for this visit.  Visit Diagnosis:  Pain in both shoulders - Plan: PT plan of care cert/re-cert  Weakness of shoulder - Plan: PT plan of care cert/re-cert  Abnormal posture - Plan: PT plan of care cert/re-cert      Subjective Assessment - 09/08/14 0939    Symptoms Patient reports he began having bilat shoulder pain about a month ago, The pain is in the Rt shoulder and it began after his hep vaccination.    Pertinent History Reports he has to drive with his Lt UE at times because Rt is painfull.     Diagnostic tests x-rays were negative for fx.   Patient Stated Goals to get rid of pain and get his arm were it can work better, complete his physical training for work   Currently in Pain? Yes  no pain in Lt shoulder at this time.    Pain Score 5    Pain Location Shoulder   Pain Orientation Right   Pain Descriptors / Indicators  Aching;Burning;Jabbing;Stabbing;Throbbing   Pain Type Acute pain   Pain Onset More than a month ago   Pain Frequency Constant   Aggravating Factors  using the arm and at the end of the day.    Pain Relieving Factors sometimes a hot shower          [facility] PT Assessment - 09/08/14 0001    Assessment   Medical Diagnosis bilat shoulder pain   Onset Date 08/10/14   Next MD Visit 09/09/14   Precautions   Precautions None   Balance Screen   Has the patient fallen in the past 6 months No   Has the patient had a decrease in activity level because of a fear of falling?  No   Is the patient reluctant to leave their home because of a fear of falling?  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    Observation/Other Assessments   Focus on Therapeutic Outcomes (FOTO)  39% limited   Posture/Postural Control   Posture/Postural Control Postural limitations   Postural Limitations Rounded  Shoulders;Decreased lumbar lordosis;Decreased thoracic kyphosis;Forward head   ROM / Strength   AROM / PROM / Strength AROM;Strength   AROM   Overall AROM  --  cervical and bilat UE's WNL, pain in Rt shoulder with ROM   Strength   Strength Assessment Site Shoulder  grossly WNL except ER 4+/5 bilat. Elbows WNL,    Right/Left Shoulder --  mid trap Rt 5-/5, low traps 4/5 bilat   Palpation   Palpation pain posterior shoulder joints bilat rt > lt, tightness in Rt posterior shoulder.                   [facility] Adult PT Treatment/Exercise - 09/08/14 0001    Exercises   Exercises Shoulder   Shoulder Exercises: Standing   Other Standing Exercises ROCKWOOD 4's with red band 2x10    Modalities   Modalities Moist Heat   Moist Heat Therapy   Number Minutes Moist Heat 15 Minutes   Moist Heat  Location Shoulder  bilat and deltoids                PT Education - 09/08/14 0959    Education provided Yes   Education Details HEP   Person(s) Educated Patient   Methods Explanation;Demonstration;Handout   Comprehension Returned demonstration             PT Long Term Goals - 09/08/14 1007    PT LONG TERM GOAL #1   Title I with advanced HEP   Time 4   Period Weeks   Status New   PT LONG TERM GOAL #2   Title report decreased pain =/> 50% at the end of day   Time 4   Period Weeks   Status New   PT LONG TERM GOAL #3   Title increase strength bilat shoulder ER =/> 5-/5   Time 4   Period Weeks   Status New   PT LONG TERM GOAL #4   Title improve FOTO =/< 21%   Time 4   Period Weeks   Status New   PT LONG TERM GOAL #5   Title pushing and pulling on heavy doors without pain   Time 4   Period Weeks   Status New               Plan - 09/08/14 1001    Clinical Impression Statement Patient presents with bilat shoulder pain and functional weakness.  He also has impaired posture that he can self correct. This is impairing his ability to train and perfom his job duties   Pt will benefit from skilled therapeutic intervention in order to improve on the following deficits Pain;Impaired UE functional use;Decreased strength;Improper body mechanics   Rehab Potential Excellent   PT Frequency 2x / week   PT Duration 4 weeks   PT Treatment/Interventions Patient/family education;Therapeutic exercise;Ultrasound;Manual techniques;Cryotherapy;Electrical Stimulation   PT Next Visit Plan scap stability ex and Arts development officer education    Consulted and Agree with Plan of Care Patient         Problem List Patient Active Problem List   Diagnosis Date Noted  . Bilateral shoulder pain 08/24/2014  . Acanthosis 08/24/2014  . Diabetes mellitus, type 2 04/29/2014  . Annual physical exam 04/28/2014  . Migraine headache 05/19/2013  . Obesity (BMI 30-39.9) 03/28/2013  .  Pilonidal disease s/p I&D 03/10/2013 11/19/2012  . ADHD (attention deficit hyperactivity disorder) 11/19/2012  . Hypertriglyceridemia 11/19/2012    Jeral Pinch, PT 09/08/2014, 11:35 AM  [facility] [place], [place], [address] Phone: [phone]   Fax:  [phone]    PHYSICAL THERAPY DISCHARGE SUMMARY  Visits from Start of Care: 1  Current functional level related to goals / functional outcomes: Unable to assess as patient did not return for f/u visits   Remaining deficits: unknown   Education / Equipment: Initial hep  Plan: Patient agrees to discharge.  Patient goals were not met. Patient is being discharged due to not returning since the last visit.  ?????       Madelyn Flavors, PT 10/16/2014 12:36 PM  [facility] [place], [place] [address]  [phone] (office) [phone] (fax)

## 2014-09-08 NOTE — Patient Instructions (Signed)
Strengthening: Resisted Internal Rotation   FOR ALL EXERCISES< KEEP YOUR CHEST LIFTED AND DON'T ROTATE YOUR BODY   Hold tubing in left hand, elbow at side and forearm out. Rotate forearm in across body. Repeat 10__ times per set. Do __2-3__ sets per session. Do __1__ sessions per day.  http://orth.exer.us/830   Copyright  VHI. All rights reserved.  Strengthening: Resisted External Rotation - use a towel under your elbow.    Hold tubing in right hand, elbow at side and forearm across body. Rotate forearm out. Repeat __10__ times per set. Do _2-3___ sets per session. Do ___1_ sessions per day.  http://orth.exer.us/828   Copyright  VHI. All rights reserved.  Strengthening: Resisted Flexion   Hold tubing with left arm at side. Punch forward and up. Move shoulder through pain-free range of motion. Repeat __10__ times per set. Do _2-3___ sets per session. Do _1___ sessions per day. Repeat on the other side.   http://orth.exer.us/824   Copyright  VHI. All rights reserved.  Strengthening: Resisted Extension   Hold tubing in right hand, arm forward. Pull arm back, elbow straight. Repeat __10__ times per set. Do _2-3___ sets per session. Do _1___ sessions per day.  http://orth.exer.us/832   Copyright  VHI. All rights reserved.

## 2014-09-09 ENCOUNTER — Ambulatory Visit (INDEPENDENT_AMBULATORY_CARE_PROVIDER_SITE_OTHER): Payer: BC Managed Care – PPO | Admitting: Sports Medicine

## 2014-09-09 ENCOUNTER — Encounter: Payer: Self-pay | Admitting: Sports Medicine

## 2014-09-09 VITALS — BP 115/83 | HR 74 | Ht 71.0 in | Wt 211.0 lb

## 2014-09-09 DIAGNOSIS — J0100 Acute maxillary sinusitis, unspecified: Secondary | ICD-10-CM | POA: Insufficient documentation

## 2014-09-09 DIAGNOSIS — J0101 Acute recurrent maxillary sinusitis: Secondary | ICD-10-CM | POA: Diagnosis not present

## 2014-09-09 MED ORDER — FLUTICASONE PROPIONATE 50 MCG/ACT NA SUSP: NASAL | Status: DC

## 2014-09-09 MED ORDER — AMOXICILLIN-POT CLAVULANATE 875-125 MG PO TABS
1.0000 | ORAL_TABLET | Freq: Two times a day (BID) | ORAL | Status: DC
Start: 2014-09-09 — End: 2014-09-22

## 2014-09-09 NOTE — Assessment & Plan Note (Signed)
Augmentin, Flonase. Return in 2 weeks if no better. 

## 2014-09-09 NOTE — Progress Notes (Signed)
  Subjective:    CC: Sore throat  HPI: For the past 2 days Derrick Carter has been complaining of sore throat, postnasal drip, and worsening pressure over his maxillary sinuses, moderate, persistent, purulent nasal discharge, no fevers, chills, night sweats, weight loss, GI symptoms and no new skin rash. No shortness of breath.  Past medical history, Surgical history, Family history not pertinant except as noted below, Social history, Allergies, and medications have been entered into the medical record, reviewed, and no changes needed.   Review of Systems: No fevers, chills, night sweats, weight loss, chest pain, or shortness of breath.   Objective:    General: Well Developed, well nourished, and in no acute distress.  Neuro: Alert and oriented x3, extra-ocular muscles intact, sensation grossly intact.  HEENT: Normocephalic, atraumatic, pupils equal round reactive to light, neck supple, no masses, no lymphadenopathy, thyroid nonpalpable. Oropharynx shows mild erythema, nasopharynx is unremarkable, ear canals are unremarkable. Tender to palpation over the maxillary sinuses. Skin: Warm and dry, no rashes. Cardiac: Regular rate and rhythm, no murmurs rubs or gallops, no lower extremity edema.  Respiratory: Clear to auscultation bilaterally. Not using accessory muscles, speaking in full sentences.  Impression and Recommendations:

## 2014-09-22 ENCOUNTER — Encounter: Payer: Self-pay | Admitting: Sports Medicine

## 2014-09-22 ENCOUNTER — Ambulatory Visit (INDEPENDENT_AMBULATORY_CARE_PROVIDER_SITE_OTHER): Payer: BC Managed Care – PPO | Admitting: Sports Medicine

## 2014-09-22 VITALS — BP 111/67 | HR 91 | Ht 71.0 in | Wt 214.0 lb

## 2014-09-22 DIAGNOSIS — J0101 Acute recurrent maxillary sinusitis: Secondary | ICD-10-CM

## 2014-09-22 DIAGNOSIS — L83 Acanthosis nigricans: Secondary | ICD-10-CM

## 2014-09-22 DIAGNOSIS — E11628 Type 2 diabetes mellitus with other skin complications: Secondary | ICD-10-CM | POA: Diagnosis not present

## 2014-09-22 MED ORDER — MUPIROCIN CALCIUM 2 % NA OINT: 1.0000 "application " | TOPICAL_OINTMENT | Freq: Two times a day (BID) | NASAL | Status: DC

## 2014-09-22 MED ORDER — CLOBETASOL PROPIONATE 0.05 % EX OINT: 1.0000 "application " | TOPICAL_OINTMENT | Freq: Two times a day (BID) | CUTANEOUS | Status: DC

## 2014-09-22 NOTE — Assessment & Plan Note (Signed)
Doing well with XigDuo, we can recheck in 2 months

## 2014-09-22 NOTE — Progress Notes (Signed)
  Subjective:    CC: follow-up  HPI: Shoulder pain: Resolved with therapy.  Rash on heels: Appears to be acanthosis, improving with topical betamethasone.  Diabetes mellitus type 2: Doing well with XigDuo, will have a follow-up hemoglobin A1c in 2 months.  Past medical history, Surgical history, Family history not pertinant except as noted below, Social history, Allergies, and medications have been entered into the medical record, reviewed, and no changes needed.   Review of Systems: No fevers, chills, night sweats, weight loss, chest pain, or shortness of breath.   Objective:    General: Well Developed, well nourished, and in no acute distress.  Neuro: Alert and oriented x3, extra-ocular muscles intact, sensation grossly intact.  HEENT: Normocephalic, atraumatic, pupils equal round reactive to light, neck supple, no masses, no lymphadenopathy, thyroid nonpalpable.  Skin: Warm and dry, hyperpigmentation with thickening of the skin on both heels, improved with areas of clearance now. Cardiac: Regular rate and rhythm, no murmurs rubs or gallops, no lower extremity edema.  Respiratory: Clear to auscultation bilaterally. Not using accessory muscles, speaking in full sentences.  Impression and Recommendations:

## 2014-09-22 NOTE — Assessment & Plan Note (Signed)
Having a bit of irritation in the right nasal nare, adding mupirocin ointment.

## 2014-09-22 NOTE — Assessment & Plan Note (Signed)
Localized on both heels and improving. I am going to increase to clobetasol cream. Return in May.

## 2014-10-29 ENCOUNTER — Ambulatory Visit: Payer: BC Managed Care – PPO | Admitting: Sports Medicine

## 2014-11-03 ENCOUNTER — Encounter: Payer: Self-pay | Admitting: Sports Medicine

## 2014-11-03 ENCOUNTER — Ambulatory Visit (INDEPENDENT_AMBULATORY_CARE_PROVIDER_SITE_OTHER): Payer: BC Managed Care – PPO | Admitting: Sports Medicine

## 2014-11-03 VITALS — BP 124/79 | HR 95 | Ht 71.0 in | Wt 216.0 lb

## 2014-11-03 DIAGNOSIS — E669 Obesity, unspecified: Secondary | ICD-10-CM

## 2014-11-03 DIAGNOSIS — E11628 Type 2 diabetes mellitus with other skin complications: Secondary | ICD-10-CM

## 2014-11-03 DIAGNOSIS — M7021 Olecranon bursitis, right elbow: Secondary | ICD-10-CM

## 2014-11-03 DIAGNOSIS — L83 Acanthosis nigricans: Secondary | ICD-10-CM

## 2014-11-03 LAB — POCT GLYCOSYLATED HEMOGLOBIN (HGB A1C): Hemoglobin A1C: 7.7

## 2014-11-03 MED ORDER — TOPIRAMATE 50 MG PO TABS: ORAL_TABLET | ORAL | Status: DC

## 2014-11-03 NOTE — Assessment & Plan Note (Signed)
Overall greatly improved. Continue clobetasol.

## 2014-11-03 NOTE — Progress Notes (Signed)
  Subjective:    CC: Follow-up  HPI: Diabetes mellitus type 2: Hemoglobin A1c is 7.7. Does endorse dietary indiscretions.  Obesity: Has gained some weight. Drinks a lot of nondiet soda. Amenable to trying medication.  Elbow pain: Right-sided, present for several days, tends to bump the tip of his elbow on objects.  Posterior ankle acanthosis: Nearly completely resolved with topical clobetasol.  Past medical history, Surgical history, Family history not pertinant except as noted below, Social history, Allergies, and medications have been entered into the medical record, reviewed, and no changes needed.   Review of Systems: No fevers, chills, night sweats, weight loss, chest pain, or shortness of breath.   Objective:    General: Well Developed, well nourished, and in no acute distress.  Neuro: Alert and oriented x3, extra-ocular muscles intact, sensation grossly intact.  HEENT: Normocephalic, atraumatic, pupils equal round reactive to light, neck supple, no masses, no lymphadenopathy, thyroid nonpalpable.  Skin: Warm and dry, only minimal remaining acanthosis on the posterior ankles. Cardiac: Regular rate and rhythm, no murmurs rubs or gallops, no lower extremity edema.  Respiratory: Clear to auscultation bilaterally. Not using accessory muscles, speaking in full sentences. Right Elbow: Unremarkable to inspection. Range of motion full pronation, supination, flexion, extension. Strength is full to all of the above directions Stable to varus, valgus stress. Negative moving valgus stress test. Tender to palpation over the olecranon, no visible olecranon bursitis. Ulnar nerve does not sublux. Negative cubital tunnel Tinel's.  Impression and Recommendations:

## 2014-11-03 NOTE — Assessment & Plan Note (Addendum)
Restarting Topamax, this will decrease his desire for sodas. Return in one month to recheck weight. We can certainly start phentermine if persistent obesity.

## 2014-11-03 NOTE — Assessment & Plan Note (Signed)
Hemoglobin A1c is approximately the same at 7.7. Weight loss will certainly help bring this down before we consider addition of more metformin.
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
## 2014-11-13 ENCOUNTER — Ambulatory Visit: Payer: BC Managed Care – PPO | Admitting: Sports Medicine

## 2014-11-19 ENCOUNTER — Other Ambulatory Visit: Payer: Self-pay | Admitting: Sports Medicine

## 2014-11-30 ENCOUNTER — Other Ambulatory Visit: Payer: Self-pay | Admitting: Sports Medicine

## 2014-12-01 ENCOUNTER — Encounter: Payer: Self-pay | Admitting: Sports Medicine

## 2014-12-01 ENCOUNTER — Ambulatory Visit (INDEPENDENT_AMBULATORY_CARE_PROVIDER_SITE_OTHER): Payer: BC Managed Care – PPO | Admitting: Sports Medicine

## 2014-12-01 VITALS — BP 127/84 | HR 82 | Ht 71.0 in | Wt 215.0 lb

## 2014-12-01 DIAGNOSIS — E781 Pure hyperglyceridemia: Secondary | ICD-10-CM

## 2014-12-01 DIAGNOSIS — E669 Obesity, unspecified: Secondary | ICD-10-CM | POA: Diagnosis not present

## 2014-12-01 LAB — COMPREHENSIVE METABOLIC PANEL WITH GFR
ALT: 43 U/L (ref 0–53)
AST: 27 U/L (ref 0–37)
Potassium: 3.9 meq/L (ref 3.5–5.3)
Sodium: 138 meq/L (ref 135–145)

## 2014-12-01 LAB — COMPREHENSIVE METABOLIC PANEL
Albumin: 4.7 g/dL (ref 3.5–5.2)
Alkaline Phosphatase: 35 U/L — ABNORMAL LOW (ref 39–117)
BUN: 16 mg/dL (ref 6–23)
CO2: 29 mEq/L (ref 19–32)
Calcium: 9.6 mg/dL (ref 8.4–10.5)
Chloride: 99 mEq/L (ref 96–112)
Creat: 0.9 mg/dL (ref 0.50–1.35)
Glucose, Bld: 187 mg/dL — ABNORMAL HIGH (ref 70–99)
Total Bilirubin: 0.6 mg/dL (ref 0.2–1.2)
Total Protein: 7.3 g/dL (ref 6.0–8.3)

## 2014-12-01 LAB — HEMOGLOBIN A1C
Hgb A1c MFr Bld: 8.2 % — ABNORMAL HIGH (ref <5.7)
Mean Plasma Glucose: 189 mg/dL — ABNORMAL HIGH (ref <117)

## 2014-12-01 LAB — LIPID PANEL
Cholesterol: 241 mg/dL — ABNORMAL HIGH (ref 0–200)
HDL: 24 mg/dL — ABNORMAL LOW (ref >=40)
LDL Cholesterol: 141 mg/dL — ABNORMAL HIGH (ref 0–99)
Total CHOL/HDL Ratio: 10 ratio
Triglycerides: 381 mg/dL — ABNORMAL HIGH (ref <150)
VLDL: 76 mg/dL — ABNORMAL HIGH (ref 0–40)

## 2014-12-01 LAB — TSH: TSH: 3.555 u[IU]/mL (ref 0.350–4.500)

## 2014-12-01 NOTE — Progress Notes (Signed)
  Subjective:    CC: follow-up  HPI: Obesity: Derrick Carter did not take any of his medication, he does not want to use any more medication, and he is happy with how things are going so far.  Past medical history, Surgical history, Family history not pertinant except as noted below, Social history, Allergies, and medications have been entered into the medical record, reviewed, and no changes needed.   Review of Systems: No fevers, chills, night sweats, weight loss, chest pain, or shortness of breath.   Objective:    General: Well Developed, well nourished, and in no acute distress.  Neuro: Alert and oriented x3, extra-ocular muscles intact, sensation grossly intact.  HEENT: Normocephalic, atraumatic, pupils equal round reactive to light, neck supple, no masses, no lymphadenopathy, thyroid nonpalpable.  Skin: Warm and dry, no rashes. Cardiac: Regular rate and rhythm, no murmurs rubs or gallops, no lower extremity edema.  Respiratory: Clear to auscultation bilaterally. Not using accessory muscles, speaking in full sentences.  Impression and Recommendations:

## 2014-12-01 NOTE — Assessment & Plan Note (Signed)
Rechecking lipids. 

## 2014-12-01 NOTE — Assessment & Plan Note (Signed)
Weight is approximately the same, patient has not taken any of the medications. He does not desire to take any weight loss medications, we will continue to watch this.

## 2014-12-07 ENCOUNTER — Telehealth: Payer: Self-pay | Admitting: Sports Medicine

## 2014-12-07 ENCOUNTER — Other Ambulatory Visit: Payer: Self-pay | Admitting: Sports Medicine

## 2014-12-07 MED ORDER — IBUPROFEN 800 MG PO TABS: 800.0000 mg | ORAL_TABLET | Freq: Three times a day (TID) | ORAL | Status: DC | PRN

## 2014-12-07 NOTE — Telephone Encounter (Signed)
Pt requested refill on ibuprofen 800mg . This Rx has not been filled since 2014. Please advise if appropriate.

## 2014-12-07 NOTE — Telephone Encounter (Signed)
Medication refilled

## 2014-12-15 ENCOUNTER — Encounter: Payer: Self-pay | Admitting: Sports Medicine

## 2014-12-15 ENCOUNTER — Ambulatory Visit (INDEPENDENT_AMBULATORY_CARE_PROVIDER_SITE_OTHER): Payer: BC Managed Care – PPO | Admitting: Sports Medicine

## 2014-12-15 ENCOUNTER — Ambulatory Visit (INDEPENDENT_AMBULATORY_CARE_PROVIDER_SITE_OTHER): Payer: BC Managed Care – PPO

## 2014-12-15 VITALS — BP 121/77 | HR 63 | Wt 216.0 lb

## 2014-12-15 DIAGNOSIS — E781 Pure hyperglyceridemia: Secondary | ICD-10-CM | POA: Diagnosis not present

## 2014-12-15 DIAGNOSIS — M25532 Pain in left wrist: Secondary | ICD-10-CM | POA: Diagnosis not present

## 2014-12-15 DIAGNOSIS — M654 Radial styloid tenosynovitis [de Quervain]: Secondary | ICD-10-CM | POA: Insufficient documentation

## 2014-12-15 DIAGNOSIS — E119 Type 2 diabetes mellitus without complications: Secondary | ICD-10-CM | POA: Diagnosis not present
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
## 2014-12-15 NOTE — Patient Instructions (Signed)
Diabetes Mellitus and Food It is important for you to manage your blood sugar (glucose) level. Your blood glucose level can be greatly affected by what you eat. Eating healthier foods in the appropriate amounts throughout the day at about the same time each day will help you control your blood glucose level. It can also help slow or prevent worsening of your diabetes mellitus. Healthy eating may even help you improve the level of your blood pressure and reach or maintain a healthy weight.  HOW CAN FOOD AFFECT ME? Carbohydrates Carbohydrates affect your blood glucose level more than any other type of food. Your dietitian will help you determine how many carbohydrates to eat at each meal and teach you how to count carbohydrates. Counting carbohydrates is important to keep your blood glucose at a healthy level, especially if you are using insulin or taking certain medicines for diabetes mellitus. Alcohol Alcohol can cause sudden decreases in blood glucose (hypoglycemia), especially if you use insulin or take certain medicines for diabetes mellitus. Hypoglycemia can be a life-threatening condition. Symptoms of hypoglycemia (sleepiness, dizziness, and disorientation) are similar to symptoms of having too much alcohol.  If your health care provider has given you approval to drink alcohol, do so in moderation and use the following guidelines:  Women should not have more than one drink per day, and men should not have more than two drinks per day. One drink is equal to:  12 oz of beer.  5 oz of wine.  1 oz of hard liquor.  Do not drink on an empty stomach.  Keep yourself hydrated. Have water, diet soda, or unsweetened iced tea.  Regular soda, juice, and other mixers might contain a lot of carbohydrates and should be counted. WHAT FOODS ARE NOT RECOMMENDED? As you make food choices, it is important to remember that all foods are not the same. Some foods have fewer nutrients per serving than other  foods, even though they might have the same number of calories or carbohydrates. It is difficult to get your body what it needs when you eat foods with fewer nutrients. Examples of foods that you should avoid that are high in calories and carbohydrates but low in nutrients include:  Trans fats (most processed foods list trans fats on the Nutrition Facts label).  Regular soda.  Juice.  Candy.  Sweets, such as cake, pie, doughnuts, and cookies.  Fried foods. WHAT FOODS CAN I EAT? Have nutrient-rich foods, which will nourish your body and keep you healthy. The food you should eat also will depend on several factors, including:  The calories you need.  The medicines you take.  Your weight.  Your blood glucose level.  Your blood pressure level.  Your cholesterol level. You also should eat a variety of foods, including:  Protein, such as meat, poultry, fish, tofu, nuts, and seeds (lean animal proteins are best).  Fruits.  Vegetables.  Dairy products, such as milk, cheese, and yogurt (low fat is best).  Breads, grains, pasta, cereal, rice, and beans.  Fats such as olive oil, trans fat-free margarine, canola oil, avocado, and olives. DOES EVERYONE WITH DIABETES MELLITUS HAVE THE SAME MEAL PLAN? Because every person with diabetes mellitus is different, there is not one meal plan that works for everyone. It is very important that you meet with a dietitian who will help you create a meal plan that is just right for you. Document Released: 03/16/2005 Document Revised: 06/24/2013 Document Reviewed: 05/16/2013 [employer] Patient Information 2015 [employer]. This   information is not intended to replace advice given to you by your health care provider. Make sure you discuss any questions you have with your health care provider.  

## 2014-12-15 NOTE — Assessment & Plan Note (Signed)
Extremely uncontrolled, patient will work on dietary modifications for the next 3 months and we can recheck.
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
## 2014-12-15 NOTE — Assessment & Plan Note (Signed)
Highly uncontrolled. Patient has significantly increased his carbohydrate and junk food intake. Hemoglobin A1c has gone from well controlled to uncontrolled. We will work on dietary modifications, and if no improvement in 3 months we will probably start Saxenda.

## 2015-02-19 ENCOUNTER — Other Ambulatory Visit: Payer: Self-pay | Admitting: Sports Medicine

## 2015-03-18 ENCOUNTER — Encounter: Payer: Self-pay | Admitting: Sports Medicine

## 2015-03-18 ENCOUNTER — Ambulatory Visit (INDEPENDENT_AMBULATORY_CARE_PROVIDER_SITE_OTHER): Payer: BC Managed Care – PPO | Admitting: Sports Medicine

## 2015-03-18 VITALS — BP 125/85 | HR 95 | Temp 98.1°F | Wt 211.0 lb

## 2015-03-18 DIAGNOSIS — E781 Pure hyperglyceridemia: Secondary | ICD-10-CM

## 2015-03-18 DIAGNOSIS — J209 Acute bronchitis, unspecified: Secondary | ICD-10-CM | POA: Diagnosis not present

## 2015-03-18 DIAGNOSIS — E119 Type 2 diabetes mellitus without complications: Secondary | ICD-10-CM

## 2015-03-18 LAB — COMPREHENSIVE METABOLIC PANEL WITH GFR
AST: 22 U/L (ref 10–40)
Albumin: 4.6 g/dL (ref 3.6–5.1)
Calcium: 9.5 mg/dL (ref 8.6–10.3)
Sodium: 138 mmol/L (ref 135–146)
Total Bilirubin: 0.6 mg/dL (ref 0.2–1.2)
Total Protein: 7.4 g/dL (ref 6.1–8.1)

## 2015-03-18 LAB — LIPID PANEL
Cholesterol: 246 mg/dL — ABNORMAL HIGH (ref 125–200)
HDL: 24 mg/dL — ABNORMAL LOW (ref >=40)
LDL Cholesterol: 159 mg/dL — ABNORMAL HIGH (ref <130)
Total CHOL/HDL Ratio: 10.3 ratio — ABNORMAL HIGH (ref <=5.0)
Triglycerides: 316 mg/dL — ABNORMAL HIGH (ref <150)
VLDL: 63 mg/dL — ABNORMAL HIGH (ref <30)

## 2015-03-18 LAB — COMPREHENSIVE METABOLIC PANEL
ALT: 34 U/L (ref 9–46)
Alkaline Phosphatase: 39 U/L — ABNORMAL LOW (ref 40–115)
BUN: 14 mg/dL (ref 7–25)
CO2: 26 mmol/L (ref 20–31)
Chloride: 102 mmol/L (ref 98–110)
Creat: 0.88 mg/dL (ref 0.60–1.35)
Glucose, Bld: 188 mg/dL — ABNORMAL HIGH (ref 65–99)
Potassium: 4 mmol/L (ref 3.5–5.3)

## 2015-03-18 LAB — CBC
HCT: 47.6 % (ref 39.0–52.0)
Hemoglobin: 16.1 g/dL (ref 13.0–17.0)
MCH: 30.6 pg (ref 26.0–34.0)
MCHC: 33.8 g/dL (ref 30.0–36.0)
MCV: 90.3 fL (ref 78.0–100.0)
MPV: 10 fL (ref 8.6–12.4)
Platelets: 305 10*3/uL (ref 150–400)
RBC: 5.27 MIL/uL (ref 4.22–5.81)
RDW: 13.1 % (ref 11.5–15.5)
WBC: 9.4 10*3/uL (ref 4.0–10.5)

## 2015-03-18 LAB — LDL CHOLESTEROL, DIRECT: Direct LDL: 189 mg/dL — ABNORMAL HIGH (ref <130)

## 2015-03-18 MED ORDER — AZITHROMYCIN 250 MG PO TABS: ORAL_TABLET | ORAL | Status: DC

## 2015-03-18 MED ORDER — FLUTICASONE PROPIONATE 50 MCG/ACT NA SUSP: NASAL | Status: DC

## 2015-03-18 MED ORDER — BENZONATATE 200 MG PO CAPS: 200.0000 mg | ORAL_CAPSULE | Freq: Three times a day (TID) | ORAL | Status: DC | PRN

## 2015-03-18 NOTE — Assessment & Plan Note (Addendum)
Rechecking lipids.  Extremely elevated, adding Lipitor, continue TriCor. Recheck in one month.

## 2015-03-18 NOTE — Assessment & Plan Note (Addendum)
No changes. Has lost some weight, rechecking A1c.  Unfortunately A1c is worsened, and Trulicity, continue XigDuo. Patient will return for a nurse visit to learn how to do injections.

## 2015-03-18 NOTE — Progress Notes (Signed)
  Subjective:    CC: Coughing  HPI:  This is a pleasant 24 year old male, for the past several days had increasing cough, minimally productive with nausea, vomiting diarrhea. Symptoms are moderate, persistent. Principal symptom is cough.  Diabetes mellitus type 2: Has lost a good amount weight.  Hyperlipidemia: His lost a good amount of weight, due for recheck, he is fasting today, previously at hypertriglyceridemia.  Past medical history, Surgical history, Family history not pertinant except as noted below, Social history, Allergies, and medications have been entered into the medical record, reviewed, and no changes needed.   Review of Systems: No fevers, chills, night sweats, weight loss, chest pain, or shortness of breath.   Objective:    General: Well Developed, well nourished, and in no acute distress.  Neuro: Alert and oriented x3, extra-ocular muscles intact, sensation grossly intact.  HEENT: Normocephalic, atraumatic, pupils equal round reactive to light, neck supple, no masses, no lymphadenopathy, thyroid nonpalpable. Significant nasal mucinous discharge, ear canals and oropharynx are unremarkable.  Skin: Warm and dry, no rashes. Cardiac: Regular rate and rhythm, no murmurs rubs or gallops, no lower extremity edema.  Respiratory: Clear to auscultation bilaterally. Not using accessory muscles, speaking in full sentences.  Impression and Recommendations:

## 2015-03-18 NOTE — Assessment & Plan Note (Signed)
In an uncontrolled diabetic, we will treat this aggressively with Azithromycin, intranasal flonase, tessalon. Out of work until next shift on Tuesday.

## 2015-03-19 LAB — HEMOGLOBIN A1C
Hgb A1c MFr Bld: 9 % — ABNORMAL HIGH (ref <5.7)
Mean Plasma Glucose: 212 mg/dL — ABNORMAL HIGH (ref <117)

## 2015-03-19 MED ORDER — ATORVASTATIN CALCIUM 40 MG PO TABS: 40.0000 mg | ORAL_TABLET | Freq: Every day | ORAL | Status: DC

## 2015-03-19 MED ORDER — FENOFIBRATE 145 MG PO TABS: 145.0000 mg | ORAL_TABLET | Freq: Every day | ORAL | Status: DC

## 2015-03-19 MED ORDER — DULAGLUTIDE 0.75 MG/0.5ML ~~LOC~~ SOAJ: 0.5000 mL | SUBCUTANEOUS | Status: DC

## 2015-03-19 NOTE — Addendum Note (Signed)
Addended by: Monica Becton on: 03/19/2015 10:26 AM   Modules accepted: Orders

## 2015-03-24 ENCOUNTER — Ambulatory Visit: Payer: BC Managed Care – PPO | Admitting: Sports Medicine

## 2015-03-25 ENCOUNTER — Encounter: Payer: Self-pay | Admitting: Sports Medicine

## 2015-03-25 ENCOUNTER — Ambulatory Visit: Payer: BC Managed Care – PPO | Admitting: Sports Medicine

## 2015-04-01 ENCOUNTER — Encounter: Payer: Self-pay | Admitting: Sports Medicine

## 2015-04-01 ENCOUNTER — Ambulatory Visit (INDEPENDENT_AMBULATORY_CARE_PROVIDER_SITE_OTHER): Payer: BC Managed Care – PPO | Admitting: Sports Medicine

## 2015-04-01 VITALS — BP 122/84 | HR 54 | Ht 71.0 in | Wt 211.0 lb

## 2015-04-01 DIAGNOSIS — Z23 Encounter for immunization: Secondary | ICD-10-CM

## 2015-04-01 DIAGNOSIS — E119 Type 2 diabetes mellitus without complications: Secondary | ICD-10-CM | POA: Diagnosis not present

## 2015-04-01 DIAGNOSIS — J209 Acute bronchitis, unspecified: Secondary | ICD-10-CM

## 2015-04-01 MED ORDER — HYDROCOD POLST-CPM POLST ER 10-8 MG/5ML PO SUER
5.0000 mL | Freq: Two times a day (BID) | ORAL | Status: DC | PRN
Start: 2015-04-01 — End: 2015-05-18

## 2015-04-01 MED ORDER — DULAGLUTIDE 0.75 MG/0.5ML ~~LOC~~ SOAJ: 0.5000 mL | SUBCUTANEOUS | Status: DC

## 2015-04-01 NOTE — Progress Notes (Signed)
  Subjective:    CC: Follow-up  HPI: Acute bronchitis: Still coughing but symptoms have improved significantly.  Diabetes mellitus type 2: Has not yet picked up Trulicity  Past medical history, Surgical history, Family history not pertinant except as noted below, Social history, Allergies, and medications have been entered into the medical record, reviewed, and no changes needed.   Review of Systems: No fevers, chills, night sweats, weight loss, chest pain, or shortness of breath.   Objective:    General: Well Developed, well nourished, and in no acute distress.  Neuro: Alert and oriented x3, extra-ocular muscles intact, sensation grossly intact.  HEENT: Normocephalic, atraumatic, pupils equal round reactive to light, neck supple, no masses, no lymphadenopathy, thyroid nonpalpable.  Skin: Warm and dry, no rashes. Cardiac: Regular rate and rhythm, no murmurs rubs or gallops, no lower extremity edema.  Respiratory: Clear to auscultation bilaterally. Not using accessory muscles, speaking in full sentences.  Impression and Recommendations:    I spent 25 minutes with this patient, greater than 50% was face-to-face time counseling regarding the above diagnoses

## 2015-04-01 NOTE — Assessment & Plan Note (Signed)
Overall improving, still has some postinfectious cough which can last up to 4 weeks. Exam is benign. Switching to Tussionex

## 2015-04-06 ENCOUNTER — Telehealth: Payer: Self-pay | Admitting: Sports Medicine

## 2015-04-06 NOTE — Telephone Encounter (Signed)
Received fax for prior authorization on Trulicity sent through cover my meds and received a notification that a prior authorization was not required. When looking at what the pharmacy had on file I noticed that the card holder ID's were different I called and spoke with Selena Batten Edker's mom and she was going to make sure the pharmacy had the correct information then she was going to call me back. -CF

## 2015-04-06 NOTE — Telephone Encounter (Signed)
I called [employer] and started a prior authorization on the phone which was sent to clinical review case id [id number]. They will notify us of the determination. - CF

## 2015-04-08 NOTE — Telephone Encounter (Signed)
Received fax from [facility] they approved Trulicity from 04/07/15 - 04/06/2016. Reference # [id number]. Pharmacy and patient have been notified of the approval. - CF

## 2015-04-20 ENCOUNTER — Ambulatory Visit (INDEPENDENT_AMBULATORY_CARE_PROVIDER_SITE_OTHER): Payer: BC Managed Care – PPO | Admitting: Sports Medicine

## 2015-04-20 VITALS — BP 139/80 | HR 87 | Wt 210.0 lb

## 2015-04-20 DIAGNOSIS — E669 Obesity, unspecified: Secondary | ICD-10-CM

## 2015-04-20 NOTE — Progress Notes (Signed)
Patient came into clinic today accompanied by his mother to get education on Trulicity injections. Patients mother will be giving him these injections at home. Pt brought the injection into office today with him. Went into great detail on administration locations, how to lock/unlock injection so needle will be accessible, administration of injection (entire syringe), and proper disposal of used syringes. Stressed the importance of needle safety and went over risk of infection possibilities. Pt's mother was able to administer the injection in office today, no complications. No further questions. Advised Pt and his mother to contact office if they have any further questions or concerns, verbalized understanding.

## 2015-05-12 ENCOUNTER — Ambulatory Visit: Payer: BC Managed Care – PPO | Admitting: Sports Medicine

## 2015-05-18 ENCOUNTER — Encounter: Payer: Self-pay | Admitting: Sports Medicine

## 2015-05-18 ENCOUNTER — Ambulatory Visit (INDEPENDENT_AMBULATORY_CARE_PROVIDER_SITE_OTHER): Payer: BC Managed Care – PPO | Admitting: Sports Medicine

## 2015-05-18 VITALS — BP 119/77 | HR 63 | Temp 97.8°F | Resp 18 | Wt 209.8 lb

## 2015-05-18 DIAGNOSIS — E669 Obesity, unspecified: Secondary | ICD-10-CM

## 2015-05-18 DIAGNOSIS — E119 Type 2 diabetes mellitus without complications: Secondary | ICD-10-CM

## 2015-05-18 DIAGNOSIS — E781 Pure hyperglyceridemia: Secondary | ICD-10-CM

## 2015-05-18 LAB — COMPREHENSIVE METABOLIC PANEL WITH GFR
BUN: 14 mg/dL (ref 7–25)
Chloride: 104 mmol/L (ref 98–110)
Potassium: 3.9 mmol/L (ref 3.5–5.3)
Total Protein: 7.2 g/dL (ref 6.1–8.1)

## 2015-05-18 LAB — LIPID PANEL
Cholesterol: 123 mg/dL — ABNORMAL LOW (ref 125–200)
HDL: 27 mg/dL — ABNORMAL LOW (ref >=40)
LDL Cholesterol: 66 mg/dL (ref <130)
Total CHOL/HDL Ratio: 4.6 Ratio (ref <=5.0)
Triglycerides: 151 mg/dL — ABNORMAL HIGH (ref <150)
VLDL: 30 mg/dL (ref <30)

## 2015-05-18 LAB — COMPREHENSIVE METABOLIC PANEL
ALT: 37 U/L (ref 9–46)
AST: 24 U/L (ref 10–40)
Albumin: 4.5 g/dL (ref 3.6–5.1)
Alkaline Phosphatase: 54 U/L (ref 40–115)
CO2: 28 mmol/L (ref 20–31)
Calcium: 9.5 mg/dL (ref 8.6–10.3)
Creat: 0.83 mg/dL (ref 0.60–1.35)
Glucose, Bld: 140 mg/dL — ABNORMAL HIGH (ref 65–99)
Sodium: 140 mmol/L (ref 135–146)
Total Bilirubin: 0.7 mg/dL (ref 0.2–1.2)

## 2015-05-18 LAB — HEMOGLOBIN A1C
Hgb A1c MFr Bld: 7.8 % — ABNORMAL HIGH (ref <5.7)
Mean Plasma Glucose: 177 mg/dL — ABNORMAL HIGH (ref <117)

## 2015-05-18 LAB — LDL CHOLESTEROL, DIRECT: Direct LDL: 82 mg/dL (ref <130)

## 2015-05-18 MED ORDER — DULAGLUTIDE 1.5 MG/0.5ML ~~LOC~~ SOAJ: SUBCUTANEOUS | Status: DC

## 2015-05-18 NOTE — Assessment & Plan Note (Addendum)
Currently doing Trulicity and XigDuo. Previous A1c was 9, 2 months ago, it is a bit of an early check but will see how he's doing. A1c has improved to 7.5, increasing Trulicity to 1.5 mg.

## 2015-05-18 NOTE — Progress Notes (Signed)
  Subjective:    CC: Follow-up  HPI: Obesity: Good weight loss on Trulicity  Diabetes mellitus type 2: Hemoglobin A1c has improved to 7.5 from 9, 2 months ago, no adverse effects from the medication.  Hypertriglyceridemia: Currently on fenofibrate and atorvastatin, fasting today and agreeable to recheck. Stable.  Past medical history, Surgical history, Family history not pertinant except as noted below, Social history, Allergies, and medications have been entered into the medical record, reviewed, and no changes needed.   Review of Systems: No fevers, chills, night sweats, weight loss, chest pain, or shortness of breath.   Objective:    General: Well Developed, well nourished, and in no acute distress.  Neuro: Alert and oriented x3, extra-ocular muscles intact, sensation grossly intact.  HEENT: Normocephalic, atraumatic, pupils equal round reactive to light, neck supple, no masses, no lymphadenopathy, thyroid nonpalpable.  Skin: Warm and dry, no rashes. Cardiac: Regular rate and rhythm, no murmurs rubs or gallops, no lower extremity edema.  Respiratory: Clear to auscultation bilaterally. Not using accessory muscles, speaking in full sentences.  Impression and Recommendations:   I spent 25 minutes with this patient, greater than 50% was face-to-face time counseling regarding the above diagnoses

## 2015-05-18 NOTE — Assessment & Plan Note (Signed)
On both Lipitor and fenofibrate high-dose. Rechecking lipids today, he is fasting.

## 2015-05-18 NOTE — Assessment & Plan Note (Signed)
Continues to lose weight on XigDuo and Trulicity.

## 2015-06-01 ENCOUNTER — Encounter: Payer: BC Managed Care – PPO | Admitting: Sports Medicine

## 2015-06-17 ENCOUNTER — Ambulatory Visit: Payer: BC Managed Care – PPO | Admitting: Sports Medicine

## 2015-07-05 ENCOUNTER — Encounter: Payer: Self-pay | Admitting: *Deleted

## 2015-07-05 ENCOUNTER — Emergency Department (INDEPENDENT_AMBULATORY_CARE_PROVIDER_SITE_OTHER)
Admission: EM | Admit: 2015-07-05 | Discharge: 2015-07-05 | Disposition: A | Discharge status: Home / Self Care | Payer: BC Managed Care – PPO | Source: Home / Self Care | Attending: Family Medicine | Admitting: Family Medicine

## 2015-07-05 DIAGNOSIS — R197 Diarrhea, unspecified: Secondary | ICD-10-CM

## 2015-07-05 DIAGNOSIS — J209 Acute bronchitis, unspecified: Secondary | ICD-10-CM

## 2015-07-05 HISTORY — DX: Hyperlipidemia, unspecified: E78.5

## 2015-07-05 HISTORY — DX: Type 2 diabetes mellitus without complications: E11.9

## 2015-07-05 MED ORDER — GUAIFENESIN-CODEINE 100-10 MG/5ML PO SOLN: ORAL | Status: DC

## 2015-07-05 MED ORDER — AZITHROMYCIN 250 MG PO TABS: ORAL_TABLET | ORAL | Status: DC

## 2015-07-05 NOTE — ED Notes (Signed)
Pt c/o cough, bilateral ear ache, brown/green nasal congestion, vomiting and diarrhea x 2 days. He reports temp Saturday night of 100.0.

## 2015-07-05 NOTE — Discharge Instructions (Signed)
Begin clear liquids (Pedialyte while having diarrhea) until improved, then advance to a SUPERVALU INCBRAT diet (Bananas, Rice, Applesauce, Toast).  Then gradually resume a regular diet when tolerated.  Avoid milk products until well.  When stools become more formed, may take Imodium (loperamide) once or twice daily to decrease stool frequency.   Take plain guaifenesin (1200mg  extended release tabs such as Mucinex) twice daily, with plenty of water, for cough and congestion.  May add Pseudoephedrine (30mg , one or two every 4 to 6 hours) for sinus congestion.  Get adequate rest.   May use Afrin nasal spray (or generic oxymetazoline) twice daily for about 5 days and then discontinue.  Also recommend using saline nasal spray several times daily and saline nasal irrigation (AYR is a common brand).   Try warm salt water gargles for sore throat.  Stop all antihistamines for now, and other non-prescription cough/cold preparations.   If symptoms become significantly worse during the night or over the weekend, proceed to the local emergency room.

## 2015-07-05 NOTE — ED Provider Notes (Signed)
CSN: [id number]     Arrival date & time 07/05/15  1356 History   First MD Initiated Contact with Patient 07/05/15 1445     Chief Complaint  Patient presents with  . Cough  . Diarrhea      HPI Comments: Two days ago patient developed nausea/vomiting and diarrhea, as well as typical cold-like symptoms including mild sore throat, sinus congestion, myalgias, fatigue, and cough.  He is now able to take fluids.  The history is provided by the patient and a parent.    Past Medical History  Diagnosis Date  . ADHD (attention deficit hyperactivity disorder)   . Pilonidal cyst 03/2013  . Dental crown present   . Diabetes mellitus without complication (HCC)   . Hyperlipidemia    Past Surgical History  Procedure Laterality Date  . No past surgeries    . Pilonidal cyst excision N/A 03/10/2013    Procedure: CYST EXCISION PILONIDAL SIMPLE I and D ;  Surgeon: Robyne AskewPaul S Toth III, MD;  Location: [facility];  Service: General;  Laterality: N/A;   Family History  Problem Relation Age of Onset  . Hyperlipidemia Mother   . Diabetes Maternal Grandmother   . Hyperlipidemia Maternal Grandmother   . Hyperlipidemia Maternal Grandfather    Social History  Substance Use Topics  . Smoking status: Never Smoker   . Smokeless tobacco: Never Used  . Alcohol Use: No    Review of Systems + sore throat + cough No pleuritic pain No wheezing + nasal congestion + post-nasal drainage No sinus pain/pressure No itchy/red eyes No earache No hemoptysis No SOB + fever, + chills + nausea + vomiting, resolved + abdominal pain + diarrhea No urinary symptoms No skin rash + fatigue + myalgias No headache Used OTC meds without relief  Allergies  Review of patient's allergies indicates no known allergies.  Home Medications   Prior to Admission medications   Medication Sig Start Date End Date Taking? Authorizing Provider  atorvastatin (LIPITOR) 40 MG tablet Take 1 tablet (40 mg total) by  mouth daily. 03/19/15   Monica Bectonhomas J Thekkekandam, MD  azithromycin (ZITHROMAX Z-PAK) 250 MG tablet Take 2 tabs today; then begin one tab once daily for 4 more days. 07/05/15   Lattie HawStephen A Devani Odonnel, MD  clobetasol ointment (TEMOVATE) 0.05 % Apply 1 application topically 2 (two) times daily. 09/22/14   Monica Bectonhomas J Thekkekandam, MD  Dapagliflozin-Metformin HCl ER (XIGDUO XR) 04-999 MG TB24 Take 1 tablet by mouth daily. 07/08/14   Monica Bectonhomas J Thekkekandam, MD  Dulaglutide (TRULICITY) 1.5 MG/0.5ML SOPN Inject 1.5 mg subcutaneous weekly 05/18/15   Monica Bectonhomas J Thekkekandam, MD  fenofibrate (TRICOR) 145 MG tablet Take 1 tablet (145 mg total) by mouth daily. 03/19/15   Monica Bectonhomas J Thekkekandam, MD  fluticasone (FLONASE) 50 MCG/ACT nasal spray One spray in each nostril twice a day, use left hand for right nostril, and right hand for left nostril. 03/18/15   Monica Bectonhomas J Thekkekandam, MD  guaiFENesin-codeine 100-10 MG/5ML syrup Take 10mL by mouth at bedtime as needed for cough 07/05/15   Lattie HawStephen A Ruperto Kiernan, MD  ibuprofen (ADVIL,MOTRIN) 800 MG tablet Take 1 tablet (800 mg total) by mouth every 8 (eight) hours as needed. 12/07/14   Monica Bectonhomas J Thekkekandam, MD  meloxicam (MOBIC) 15 MG tablet One tab PO qAM with breakfast for 2 weeks, then daily prn pain. 08/24/14   Monica Bectonhomas J Thekkekandam, MD  STRATTERA 80 MG capsule TAKE 1 CAPSULE (80 MG TOTAL) BY MOUTH DAILY. 11/19/14   Maisie Fushomas  Windell Moment, MD  topiramate (TOPAMAX) 50 MG tablet ONE HALF TAB BY MOUTH DAILY FOR A WEEK, THEN ONE TAB BY MOUTH DAILY. 12/07/14   Monica Becton, MD   Meds Ordered and Administered this Visit  Medications - No data to display  BP 116/78 mmHg  Pulse 94  Temp(Src) 98.3 F (36.8 C) (Oral)  Resp 16  Ht 5\' 11"  (1.803 m)  Wt 206 lb (93.441 kg)  BMI 28.74 kg/m2  SpO2 98% No data found.   Physical Exam Nursing notes and Vital Signs reviewed. Appearance:  Patient appears stated age, and in no acute distress Eyes:  Pupils are equal, round, and reactive to light and  accomodation.  Extraocular movement is intact.  Conjunctivae are not inflamed  Ears:  Canals normal.  Tympanic membranes normal.  Nose:  Mildly congested turbinates.  No sinus tenderness.    Pharynx:  Normal; moist mucous membranes  Neck:  Supple.  Tender enlarged posterior nodes are palpated bilaterally  Lungs:  Clear to auscultation.  Breath sounds are equal.  Moving air well. Heart:  Regular rate and rhythm without murmurs, rubs, or gallops.  Abdomen:  Nontender without masses or hepatosplenomegaly.  Bowel sounds are present.  No CVA or flank tenderness.  Extremities:  No edema.    Skin:  No rash present.   ED Course  Procedures none    Labs Reviewed  POCT INFLUENZA A/B negative     MDM   1. Acute bronchitis, unspecified organism   2. Diarrhea, unspecified type    Begin Z-pak for atypical coverage.  Rx for Robitussin AC for night time cough.  Begin clear liquids (Pedialyte while having diarrhea) until improved, then advance to a [employer] (Bananas, Rice, Applesauce, Toast).  Then gradually resume a regular diet when tolerated.  Avoid milk products until well.  When stools become more formed, may take Imodium (loperamide) once or twice daily to decrease stool frequency.   Take plain guaifenesin (1200mg  extended release tabs such as Mucinex) twice daily, with plenty of water, for cough and congestion.  May add Pseudoephedrine (30mg , one or two every 4 to 6 hours) for sinus congestion.  Get adequate rest.   May use Afrin nasal spray (or generic oxymetazoline) twice daily for about 5 days and then discontinue.  Also recommend using saline nasal spray several times daily and saline nasal irrigation (AYR is a common brand).   Try warm salt water gargles for sore throat.  Stop all antihistamines for now, and other non-prescription cough/cold preparations.   If symptoms become significantly worse during the night or over the weekend, proceed to the local emergency room.     Lattie Haw, MD 07/05/15 1600

## 2015-07-07 LAB — POCT INFLUENZA A/B
Influenza A, POC: NEGATIVE
Influenza B, POC: NEGATIVE

## 2015-07-13 ENCOUNTER — Encounter: Payer: BC Managed Care – PPO | Admitting: Sports Medicine

## 2015-07-26 ENCOUNTER — Other Ambulatory Visit: Payer: Self-pay | Admitting: Sports Medicine

## 2015-08-05 ENCOUNTER — Encounter: Payer: Self-pay | Admitting: Sports Medicine

## 2015-08-05 ENCOUNTER — Ambulatory Visit (INDEPENDENT_AMBULATORY_CARE_PROVIDER_SITE_OTHER): Payer: BC Managed Care – PPO | Admitting: Sports Medicine

## 2015-08-05 VITALS — BP 122/81 | HR 63 | Resp 16 | Wt 205.7 lb

## 2015-08-05 DIAGNOSIS — E781 Pure hyperglyceridemia: Secondary | ICD-10-CM

## 2015-08-05 DIAGNOSIS — L83 Acanthosis nigricans: Secondary | ICD-10-CM | POA: Diagnosis not present

## 2015-08-05 DIAGNOSIS — E119 Type 2 diabetes mellitus without complications: Secondary | ICD-10-CM

## 2015-08-05 LAB — COMPREHENSIVE METABOLIC PANEL WITH GFR
ALT: 30 U/L (ref 9–46)
Alkaline Phosphatase: 56 U/L (ref 40–115)
Calcium: 9.5 mg/dL (ref 8.6–10.3)
Potassium: 3.7 mmol/L (ref 3.5–5.3)
Total Protein: 7.2 g/dL (ref 6.1–8.1)

## 2015-08-05 LAB — CBC
HCT: 50.4 % (ref 39.0–52.0)
Hemoglobin: 17.1 g/dL — ABNORMAL HIGH (ref 13.0–17.0)
MCH: 30.6 pg (ref 26.0–34.0)
MCHC: 33.9 g/dL (ref 30.0–36.0)
MCV: 90.2 fL (ref 78.0–100.0)
MPV: 10.3 fL (ref 8.6–12.4)
Platelets: 248 10*3/uL (ref 150–400)
RBC: 5.59 MIL/uL (ref 4.22–5.81)
RDW: 13.1 % (ref 11.5–15.5)
WBC: 9 10*3/uL (ref 4.0–10.5)

## 2015-08-05 LAB — COMPREHENSIVE METABOLIC PANEL
AST: 20 U/L (ref 10–40)
Albumin: 4.6 g/dL (ref 3.6–5.1)
BUN: 14 mg/dL (ref 7–25)
CO2: 29 mmol/L (ref 20–31)
Chloride: 104 mmol/L (ref 98–110)
Creat: 0.89 mg/dL (ref 0.60–1.35)
Glucose, Bld: 139 mg/dL — ABNORMAL HIGH (ref 65–99)
Sodium: 142 mmol/L (ref 135–146)
Total Bilirubin: 1 mg/dL (ref 0.2–1.2)

## 2015-08-05 LAB — LIPID PANEL
Cholesterol: 128 mg/dL (ref 125–200)
HDL: 26 mg/dL — ABNORMAL LOW (ref >=40)
LDL Cholesterol: 63 mg/dL (ref <130)
Total CHOL/HDL Ratio: 4.9 ratio (ref <=5.0)
Triglycerides: 195 mg/dL — ABNORMAL HIGH (ref <150)
VLDL: 39 mg/dL — ABNORMAL HIGH (ref <30)

## 2015-08-05 NOTE — Assessment & Plan Note (Signed)
Has done well with Lipitor and fenofibrate, rechecking lipids today.

## 2015-08-05 NOTE — Progress Notes (Signed)
  Subjective:    CC: follow-up  HPI: Diabetes mellitus type 2: Doing well on Trulicity and XigDuo.  Hyperlipidemia: Stable on Lipitor and fenofibrate, due for a recheck  Obesity: Good continued weight loss on Trulicity  Foot pain: Desires custom orthotics  Past medical history, Surgical history, Family history not pertinant except as noted below, Social history, Allergies, and medications have been entered into the medical record, reviewed, and no changes needed.   Review of Systems: No fevers, chills, night sweats, weight loss, chest pain, or shortness of breath.   Objective:    General: Well Developed, well nourished, and in no acute distress.  Neuro: Alert and oriented x3, extra-ocular muscles intact, sensation grossly intact.  HEENT: Normocephalic, atraumatic, pupils equal round reactive to light, neck supple, no masses, no lymphadenopathy, thyroid nonpalpable.  Skin: Warm and dry, no rashes. Cardiac: Regular rate and rhythm, no murmurs rubs or gallops, no lower extremity edema.  Respiratory: Clear to auscultation bilaterally. Not using accessory muscles, speaking in full sentences.  Impression and Recommendations:

## 2015-08-05 NOTE — Assessment & Plan Note (Signed)
Completely resolved with control of diabetes and weight

## 2015-08-05 NOTE — Assessment & Plan Note (Signed)
Good control with starting Trulicity. Continued weight loss after we increased to 1.5 mg.  Checking routine blood work including micro-albumin. Foot exam today.

## 2015-08-06 LAB — MICROALBUMIN / CREATININE URINE RATIO
Creatinine, Urine: 212 mg/dL (ref 20–370)
Microalb Creat Ratio: 3 ug/mg{creat} (ref <30)
Microalb, Ur: 0.6 mg/dL

## 2015-08-06 LAB — HEMOGLOBIN A1C
Hgb A1c MFr Bld: 6.9 % — ABNORMAL HIGH (ref <5.7)
Mean Plasma Glucose: 151 mg/dL — ABNORMAL HIGH (ref <117)

## 2015-08-19 ENCOUNTER — Encounter: Payer: Self-pay | Admitting: Sports Medicine

## 2015-08-19 ENCOUNTER — Ambulatory Visit (INDEPENDENT_AMBULATORY_CARE_PROVIDER_SITE_OTHER): Payer: BC Managed Care – PPO | Admitting: Sports Medicine

## 2015-08-19 VITALS — BP 118/73 | HR 81 | Resp 18 | Wt 207.9 lb

## 2015-08-19 DIAGNOSIS — M545 Low back pain, unspecified: Secondary | ICD-10-CM

## 2015-08-19 DIAGNOSIS — E781 Pure hyperglyceridemia: Secondary | ICD-10-CM | POA: Diagnosis not present

## 2015-08-19 HISTORY — DX: Low back pain, unspecified: M54.50

## 2015-08-19 LAB — AMYLASE: Amylase: 30 U/L (ref 0–105)

## 2015-08-19 LAB — CBC WITH DIFFERENTIAL/PLATELET
Basophils Absolute: 0 10*3/uL (ref 0.0–0.1)
Basophils Relative: 0 % (ref 0–1)
Eosinophils Absolute: 0.1 K/uL (ref 0.0–0.7)
Eosinophils Relative: 1 % (ref 0–5)
HCT: 51.1 % (ref 39.0–52.0)
Hemoglobin: 17.2 g/dL — ABNORMAL HIGH (ref 13.0–17.0)
Lymphocytes Relative: 33 % (ref 12–46)
Lymphs Abs: 3 K/uL (ref 0.7–4.0)
MCH: 30.4 pg (ref 26.0–34.0)
MCHC: 33.7 g/dL (ref 30.0–36.0)
MCV: 90.3 fL (ref 78.0–100.0)
MPV: 10.3 fL (ref 8.6–12.4)
Monocytes Absolute: 0.8 K/uL (ref 0.1–1.0)
Monocytes Relative: 9 % (ref 3–12)
Neutro Abs: 5.1 10*3/uL (ref 1.7–7.7)
Neutrophils Relative %: 57 % (ref 43–77)
Platelets: 264 K/uL (ref 150–400)
RBC: 5.66 MIL/uL (ref 4.22–5.81)
RDW: 13.6 % (ref 11.5–15.5)
WBC: 9 10*3/uL (ref 4.0–10.5)

## 2015-08-19 LAB — COMPREHENSIVE METABOLIC PANEL
ALT: 34 U/L (ref 9–46)
AST: 21 U/L (ref 10–40)
Alkaline Phosphatase: 56 U/L (ref 40–115)
Calcium: 9.5 mg/dL (ref 8.6–10.3)
Glucose, Bld: 126 mg/dL — ABNORMAL HIGH (ref 65–99)
Total Bilirubin: 0.8 mg/dL (ref 0.2–1.2)

## 2015-08-19 LAB — COMPREHENSIVE METABOLIC PANEL WITH GFR
Albumin: 4.5 g/dL (ref 3.6–5.1)
BUN: 17 mg/dL (ref 7–25)
CO2: 24 mmol/L (ref 20–31)
Chloride: 105 mmol/L (ref 98–110)
Creat: 0.85 mg/dL (ref 0.60–1.35)
Potassium: 4 mmol/L (ref 3.5–5.3)
Sodium: 141 mmol/L (ref 135–146)
Total Protein: 7.1 g/dL (ref 6.1–8.1)

## 2015-08-19 LAB — LIPASE: Lipase: 17 U/L (ref 7–60)

## 2015-08-19 MED ORDER — FENOFIBRATE 160 MG PO TABS: 160.0000 mg | ORAL_TABLET | Freq: Every day | ORAL | Status: DC

## 2015-08-19 MED ORDER — MELOXICAM 15 MG PO TABS
ORAL_TABLET | ORAL | Status: DC
Start: 2015-08-19 — End: 2015-12-26

## 2015-08-19 MED ORDER — PREDNISONE 50 MG PO TABS: ORAL_TABLET | ORAL | Status: DC

## 2015-08-19 NOTE — Assessment & Plan Note (Signed)
Lipids were overall okay, triglycerides were still a bit high on atorvastatin alone, they mistakenly discontinued the fenofibrate. We are going to restart fenofibrate.

## 2015-08-19 NOTE — Assessment & Plan Note (Signed)
Most likely musculoskeletal, prednisone, Mobic, formal physical therapy. He is on a GLP-1 agonist we are going to check amylase and lipase, and there was a bit of costovertebral angle pain so adding a urinalysis. Return to see me in one month.

## 2015-08-19 NOTE — Progress Notes (Signed)
  Subjective:    CC: "Back Pain"  HPI: Patient is a 25 year old male presenting with back pain that started on August 14, 2015. Patient describes pain as 6/10 in intensity and describes it as burning/throbbing. Patient states that pain disturbs his sleep and is intensified when supine. Patient states that pain is non-radiating when standing and radiating down the back when supine. Patient denies numbness, tingling or pain in the distal extremities. Pain has not kept patient from working. Patient denies nausea, vomiting, abdominal pain, hematuria, dysuria, diarrhea, constipation or incontinence. Patient has been taking Motrin, which has not relieved his symptoms.   Past medical history, Surgical history, Family history not pertinant except as noted below, Social history, Allergies, and medications have been entered into the medical record, reviewed, and no changes needed.   Review of Systems: No fevers, chills, night sweats, weight loss, chest pain, or shortness of breath.   Objective:    General: Well Developed, well nourished, and in no acute distress.  Musculoskeletal: Patient is diffusely tender to palpation along the upper and lower back, particularly on the left side. Patient has adequate flexion, extension, and lateral flexion. Patient experiences increased pain with extension and when supine. Patient exhibits CVA tenderness bilaterally.  Neuro: Alert and oriented x3, extra-ocular muscles intact, sensation grossly intact. Patient has a negative straight leg test bilaterally.  HEENT: Normocephalic, atraumatic, pupils equal round reactive to light, neck supple, no masses, no lymphadenopathy, thyroid nonpalpable.  Abdominal: Patient exhibits some tenderness to deep palpation, particularly in the epigastric region. Patient has no guarding, rigidity or rebound tenderness. Patient has positive bowl sounds in all four quadrants. Skin: Warm and dry, no rashes. Cardiac: Regular rate and rhythm, no  murmurs rubs or gallops, no lower extremity edema.  Respiratory: Clear to auscultation bilaterally. Not using accessory muscles, speaking in full sentences.   Impression and Recommendations:    1. Muscular Strain  Differential diagnosis includes muscle strain, pancreatitis, and nephrolithiasis. Patient will undergo urinalysis and testing to ensure that lipase and amylase are within range. Patient was prescribed Mobic, prednisone for five days and physical therapy.   2. Hyperlipidemia  Patient's lipids were being controlled with combined treatment of lipitor and fenofibrate. Patient stopped taking fenofibrate because he was under the impression that he only needed Lipitor. Patient education was provided regarding the importance of the dual therapy. Patient was restarted on fenofibrate.

## 2015-08-23 LAB — URINALYSIS

## 2015-08-23 LAB — URINE CULTURE: Colony Count: 25000

## 2015-08-27 ENCOUNTER — Telehealth: Payer: Self-pay

## 2015-08-27 NOTE — Telephone Encounter (Signed)
Patient advised.

## 2015-08-27 NOTE — Telephone Encounter (Signed)
Derrick Carter is calling for his test results. Please advise.

## 2015-08-27 NOTE — Telephone Encounter (Signed)
Labs are negative, plan does not change

## 2015-09-16 ENCOUNTER — Encounter: Payer: BC Managed Care – PPO | Admitting: Sports Medicine

## 2015-09-21 ENCOUNTER — Ambulatory Visit (INDEPENDENT_AMBULATORY_CARE_PROVIDER_SITE_OTHER): Payer: BC Managed Care – PPO | Admitting: Sports Medicine

## 2015-09-21 ENCOUNTER — Ambulatory Visit: Payer: BC Managed Care – PPO | Admitting: Sports Medicine

## 2015-09-21 ENCOUNTER — Encounter: Payer: Self-pay | Admitting: Sports Medicine

## 2015-09-21 VITALS — BP 112/67 | HR 87 | Resp 16 | Wt 203.9 lb

## 2015-09-21 DIAGNOSIS — M79671 Pain in right foot: Secondary | ICD-10-CM

## 2015-09-21 DIAGNOSIS — M79672 Pain in left foot: Secondary | ICD-10-CM | POA: Diagnosis not present

## 2015-09-21 NOTE — Progress Notes (Signed)

## 2015-09-21 NOTE — Assessment & Plan Note (Signed)
Custom orthotics as above, return as needed. 

## 2015-09-30 ENCOUNTER — Ambulatory Visit: Payer: BC Managed Care – PPO | Admitting: Sports Medicine

## 2015-10-19 ENCOUNTER — Ambulatory Visit (INDEPENDENT_AMBULATORY_CARE_PROVIDER_SITE_OTHER): Payer: BC Managed Care – PPO | Admitting: Sports Medicine

## 2015-10-19 ENCOUNTER — Encounter: Payer: Self-pay | Admitting: Sports Medicine

## 2015-10-19 VITALS — BP 128/76 | HR 104 | Temp 98.5°F | Wt 203.0 lb

## 2015-10-19 DIAGNOSIS — R21 Rash and other nonspecific skin eruption: Secondary | ICD-10-CM

## 2015-10-19 LAB — HM DIABETES EYE EXAM

## 2015-10-19 MED ORDER — CLOTRIMAZOLE-BETAMETHASONE 1-0.05 % EX CREA: 1.0000 "application " | TOPICAL_CREAM | Freq: Two times a day (BID) | CUTANEOUS | Status: DC

## 2015-10-19 NOTE — Assessment & Plan Note (Signed)
Most likely tinea corporis. Lotrisone cream, return if no better in 2 weeks.

## 2015-10-19 NOTE — Progress Notes (Signed)
  Subjective:    CC:  Skin rash  HPI: This is a pleasant 25 year old male, for the past 3 days she's had a circular, itchy, erythematous rash on the anterior aspect of his left leg without evidence of trauma. Symptoms are moderate, persistent without radiation, he does have a history of diabetes mellitus type 2.  Past medical history, Surgical history, Family history not pertinant except as noted below, Social history, Allergies, and medications have been entered into the medical record, reviewed, and no changes needed.   Review of Systems: No fevers, chills, night sweats, weight loss, chest pain, or shortness of breath.   Objective:    General: Well Developed, well nourished, and in no acute distress.  Neuro: Alert and oriented x3, extra-ocular muscles intact, sensation grossly intact.  HEENT: Normocephalic, atraumatic, pupils equal round reactive to light, neck supple, no masses, no lymphadenopathy, thyroid nonpalpable.  Skin: Warm and dry, there is a 4 cm circular rash on the left anterior shin, minimally erythematous with mild scaling, slightly tender, no signs of an insect bite and no signs of bacterial superinfection. Cardiac: Regular rate and rhythm, no murmurs rubs or gallops, no lower extremity edema.  Respiratory: Clear to auscultation bilaterally. Not using accessory muscles, speaking in full sentences.  Impression and Recommendations:    I spent 25 minutes with this patient, greater than 50% was face-to-face time counseling regarding the above diagnoses

## 2015-10-19 NOTE — Patient Instructions (Signed)

## 2015-10-25 ENCOUNTER — Encounter: Payer: Self-pay | Admitting: Sports Medicine

## 2015-10-28 ENCOUNTER — Ambulatory Visit: Payer: BC Managed Care – PPO | Admitting: Sports Medicine

## 2015-11-16 ENCOUNTER — Ambulatory Visit (INDEPENDENT_AMBULATORY_CARE_PROVIDER_SITE_OTHER): Payer: BC Managed Care – PPO | Admitting: Sports Medicine

## 2015-11-16 ENCOUNTER — Encounter: Payer: Self-pay | Admitting: Sports Medicine

## 2015-11-16 VITALS — BP 107/76 | HR 79 | Resp 16 | Wt 200.3 lb

## 2015-11-16 DIAGNOSIS — Z23 Encounter for immunization: Secondary | ICD-10-CM | POA: Diagnosis not present

## 2015-11-16 DIAGNOSIS — E119 Type 2 diabetes mellitus without complications: Secondary | ICD-10-CM

## 2015-11-16 DIAGNOSIS — R21 Rash and other nonspecific skin eruption: Secondary | ICD-10-CM | POA: Diagnosis not present

## 2015-11-16 LAB — POCT GLYCOSYLATED HEMOGLOBIN (HGB A1C): Hemoglobin A1C: 6.2

## 2015-11-16 NOTE — Progress Notes (Signed)
  Subjective:    CC: Follow-up  HPI: Skin rash: Resolved with Lotrisone  Diabetes mellitus type 2: Doing well with current medications, A1c is down out of the diabetic range now.  Past medical history, Surgical history, Family history not pertinant except as noted below, Social history, Allergies, and medications have been entered into the medical record, reviewed, and no changes needed.   Review of Systems: No fevers, chills, night sweats, weight loss, chest pain, or shortness of breath.   Objective:    General: Well Developed, well nourished, and in no acute distress.  Neuro: Alert and oriented x3, extra-ocular muscles intact, sensation grossly intact.  HEENT: Normocephalic, atraumatic, pupils equal round reactive to light, neck supple, no masses, no lymphadenopathy, thyroid nonpalpable.  Skin: Warm and dry, no rashes. Cardiac: Regular rate and rhythm, no murmurs rubs or gallops, no lower extremity edema.  Respiratory: Clear to auscultation bilaterally. Not using accessory muscles, speaking in full sentences.  Impression and Recommendations:

## 2015-11-16 NOTE — Assessment & Plan Note (Signed)
Well-controlled, no changes needed. Due for tetanus vaccine and diabetic foot exam.

## 2015-11-16 NOTE — Assessment & Plan Note (Signed)
Resolved with Lotrisone 

## 2015-12-13 ENCOUNTER — Other Ambulatory Visit: Payer: Self-pay | Admitting: Sports Medicine

## 2015-12-26 ENCOUNTER — Other Ambulatory Visit: Payer: Self-pay | Admitting: Sports Medicine

## 2015-12-28 ENCOUNTER — Ambulatory Visit (INDEPENDENT_AMBULATORY_CARE_PROVIDER_SITE_OTHER): Payer: BC Managed Care – PPO | Admitting: Sports Medicine

## 2015-12-28 ENCOUNTER — Encounter: Payer: Self-pay | Admitting: Sports Medicine

## 2015-12-28 VITALS — BP 104/68 | HR 87 | Resp 18 | Wt 202.6 lb

## 2015-12-28 DIAGNOSIS — K2970 Gastritis, unspecified, without bleeding: Secondary | ICD-10-CM | POA: Diagnosis not present

## 2015-12-28 MED ORDER — GI COCKTAIL ~~LOC~~
30.0000 mL | Freq: Once | ORAL | Status: AC
  Administered 2015-12-28: 30 mL via ORAL

## 2015-12-28 MED ORDER — RANITIDINE HCL 300 MG PO TABS: 300.0000 mg | ORAL_TABLET | Freq: Two times a day (BID) | ORAL | Status: DC

## 2015-12-28 MED ORDER — SUCRALFATE 1 G PO TABS: 1.0000 g | ORAL_TABLET | Freq: Four times a day (QID) | ORAL | Status: DC

## 2015-12-28 NOTE — Progress Notes (Signed)
  Subjective:    CC: Epigastric pain  HPI: This is a pleasant 25 year old male, he comes in with a one-week history of burning epigastric pain, better with eating, minimal nausea without emesis, no hematochezia, melena. No fevers, chills, no skin rashes, has recently been on a trip to [place], no adventurous eating. Symptoms are mild, persistent.  Past medical history, Surgical history, Family history not pertinant except as noted below, Social history, Allergies, and medications have been entered into the medical record, reviewed, and no changes needed.   Review of Systems: No fevers, chills, night sweats, weight loss, chest pain, or shortness of breath.   Objective:    General: Well Developed, well nourished, and in no acute distress.  Neuro: Alert and oriented x3, extra-ocular muscles intact, sensation grossly intact.  HEENT: Normocephalic, atraumatic, pupils equal round reactive to light, neck supple, no masses, no lymphadenopathy, thyroid nonpalpable.  Skin: Warm and dry, no rashes. Cardiac: Regular rate and rhythm, no murmurs rubs or gallops, no lower extremity edema.  Respiratory: Clear to auscultation bilaterally. Not using accessory muscles, speaking in full sentences. Abdomen: Soft, tender to palpation in the epigastrium, nondistended, no bowel sounds, palpable masses, no guarding, rigidity, rebound tenderness.  Impression and Recommendations:

## 2015-12-28 NOTE — Patient Instructions (Signed)

## 2015-12-28 NOTE — Assessment & Plan Note (Signed)
GI cocktail here, ranitidine, Carafate. Return to see me if no better in 2 weeks. No red flags.

## 2016-01-20 ENCOUNTER — Ambulatory Visit: Payer: BC Managed Care – PPO | Admitting: Sports Medicine

## 2016-01-27 ENCOUNTER — Other Ambulatory Visit: Payer: Self-pay | Admitting: Sports Medicine

## 2016-01-27 DIAGNOSIS — E781 Pure hyperglyceridemia: Secondary | ICD-10-CM

## 2016-03-02 ENCOUNTER — Ambulatory Visit (INDEPENDENT_AMBULATORY_CARE_PROVIDER_SITE_OTHER): Payer: BC Managed Care – PPO | Admitting: Sports Medicine

## 2016-03-02 DIAGNOSIS — Z711 Person with feared health complaint in whom no diagnosis is made: Secondary | ICD-10-CM

## 2016-03-02 NOTE — Progress Notes (Signed)
  Subjective:    CC: Lump on head  HPI: Derrick Carter thinks he found a lump on his head. Would like me to look at it.  Past medical history, Surgical history, Family history not pertinant except as noted below, Social history, Allergies, and medications have been entered into the medical record, reviewed, and no changes needed.   Review of Systems: No fevers, chills, night sweats, weight loss, chest pain, or shortness of breath.   Objective:    General: Well Developed, well nourished, and in no acute distress.  Neuro: Alert and oriented x3, extra-ocular muscles intact, sensation grossly intact.  HEENT: Normocephalic, atraumatic, pupils equal round reactive to light, neck supple, no masses, no lymphadenopathy, thyroid nonpalpable. No palpable masses or lumps, there is a single sebaceous cyst that is nontender. Skin: Warm and dry, no rashes. Cardiac: Regular rate and rhythm, no murmurs rubs or gallops, no lower extremity edema.  Respiratory: Clear to auscultation bilaterally. Not using accessory muscles, speaking in full sentences.  Impression and Recommendations:    Worried well No palpable masses of any significance on the scalp, return as needed.

## 2016-03-02 NOTE — Assessment & Plan Note (Signed)
No palpable masses of any significance on the scalp, return as needed.

## 2016-03-08 ENCOUNTER — Other Ambulatory Visit: Payer: Self-pay | Admitting: Sports Medicine

## 2016-04-21 ENCOUNTER — Other Ambulatory Visit: Payer: Self-pay | Admitting: Sports Medicine

## 2016-04-21 DIAGNOSIS — E119 Type 2 diabetes mellitus without complications: Secondary | ICD-10-CM

## 2016-04-26 ENCOUNTER — Ambulatory Visit (INDEPENDENT_AMBULATORY_CARE_PROVIDER_SITE_OTHER): Payer: BC Managed Care – PPO | Admitting: Sports Medicine

## 2016-04-26 ENCOUNTER — Encounter: Payer: Self-pay | Admitting: Sports Medicine

## 2016-04-26 DIAGNOSIS — J209 Acute bronchitis, unspecified: Secondary | ICD-10-CM | POA: Diagnosis not present

## 2016-04-26 MED ORDER — AZITHROMYCIN 250 MG PO TABS: ORAL_TABLET | ORAL | 0 refills | Status: DC

## 2016-04-26 MED ORDER — BENZONATATE 200 MG PO CAPS: 200.0000 mg | ORAL_CAPSULE | Freq: Three times a day (TID) | ORAL | 0 refills | Status: DC | PRN

## 2016-04-26 NOTE — Assessment & Plan Note (Signed)
Benign exam. However he is a diabetic so we will treat aggressively with azithromycin, Tessalon Perles. Return as needed.

## 2016-04-26 NOTE — Progress Notes (Signed)
  Subjective:    CC: Feeling sick  HPI: For the past couple of days this pleasant 25 year old male has had severe facial pain and pressure, coughing, low-grade fevers, no GI symptoms, no rashes. He has a moderate cough that is productive of yellowish sputum.  Past medical history:  Negative.  See flowsheet/record as well for more information.  Surgical history: Negative.  See flowsheet/record as well for more information.  Family history: Negative.  See flowsheet/record as well for more information.  Social history: Negative.  See flowsheet/record as well for more information.  Allergies, and medications have been entered into the medical record, reviewed, and no changes needed.   Review of Systems: No fevers, chills, night sweats, weight loss, chest pain, or shortness of breath.   Objective:    General: Well Developed, well nourished, and in no acute distress.  Neuro: Alert and oriented x3, extra-ocular muscles intact, sensation grossly intact.  HEENT: Normocephalic, atraumatic, pupils equal round reactive to light, neck supple, no masses, no lymphadenopathy, thyroid nonpalpable. Oropharynx, nasopharynx, ear canals unremarkable with the exception of sinus pain and pressure on the maxillary sinuses. Skin: Warm and dry, no rashes. Cardiac: Regular rate and rhythm, no murmurs rubs or gallops, no lower extremity edema.  Respiratory: Clear to auscultation bilaterally. Not using accessory muscles, speaking in full sentences.  Impression and Recommendations:    Acute bronchitis Benign exam. However he is a diabetic so we will treat aggressively with azithromycin, Tessalon Perles. Return as needed.

## 2016-04-27 ENCOUNTER — Telehealth: Payer: Self-pay | Admitting: Sports Medicine

## 2016-04-27 MED ORDER — HYDROCOD POLST-CPM POLST ER 10-8 MG/5ML PO SUER: 5.0000 mL | Freq: Two times a day (BID) | ORAL | 0 refills | Status: DC | PRN

## 2016-04-27 NOTE — Telephone Encounter (Signed)
Left VM advising of new Rx.

## 2016-04-27 NOTE — Telephone Encounter (Signed)
Small amount of Tussionex will be prescribed.  Rx in box.

## 2016-04-27 NOTE — Telephone Encounter (Signed)
Pt's mother called clinic stating the tessalon pearls are "not helping at all" and request an Rx for a "stronger cough syrup." Pharmacy on file is correct. Will route.

## 2016-05-01 ENCOUNTER — Encounter: Payer: Self-pay | Admitting: Sports Medicine

## 2016-05-01 ENCOUNTER — Ambulatory Visit (INDEPENDENT_AMBULATORY_CARE_PROVIDER_SITE_OTHER): Payer: BC Managed Care – PPO | Admitting: Sports Medicine

## 2016-05-01 DIAGNOSIS — Z23 Encounter for immunization: Secondary | ICD-10-CM | POA: Diagnosis not present

## 2016-05-01 DIAGNOSIS — J209 Acute bronchitis, unspecified: Secondary | ICD-10-CM | POA: Diagnosis not present

## 2016-05-01 DIAGNOSIS — H6121 Impacted cerumen, right ear: Secondary | ICD-10-CM

## 2016-05-01 NOTE — Assessment & Plan Note (Signed)
Overall doing significantly better. He simply needs a new work note. I was also able to remove a piece of cerumen sitting against his right tympanic membrane with alligator forceps.

## 2016-05-01 NOTE — Progress Notes (Signed)
  Subjective:    CC: Followup  HPI: This is a pleasant 25 year old male, we treated him for bronchitis recently, overall he feels better, really just needs a new note for work.  He does have some mild bilateral ear pain  Past medical history:  Negative.  See flowsheet/record as well for more information.  Surgical history: Negative.  See flowsheet/record as well for more information.  Family history: Negative.  See flowsheet/record as well for more information.  Social history: Negative.  See flowsheet/record as well for more information.  Allergies, and medications have been entered into the medical record, reviewed, and no changes needed.   Review of Systems: No fevers, chills, night sweats, weight loss, chest pain, or shortness of breath.   Objective:    General: Well Developed, well nourished, and in no acute distress.  Neuro: Alert and oriented x3, extra-ocular muscles intact, sensation grossly intact.  HEENT: Normocephalic, atraumatic, pupils equal round reactive to light, neck supple, no masses, no lymphadenopathy, thyroid nonpalpable.  Oropharynx, nasopharynx ear canals unremarkable with the exception of impacted small piece of cerumen on the tympanic membrane. Skin: Warm and dry, no rashes. Cardiac: Regular rate and rhythm, no murmurs rubs or gallops, no lower extremity edema.  Respiratory: Clear to auscultation bilaterally. Not using accessory muscles, speaking in full sentences.  Indication: Cerumen impaction of the right ear(s) Medical necessity statement: On physical examination, cerumen impairs clinically significant portions of the external auditory canal, and tympanic membrane. Noted obstructive, copious cerumen that cannot be removed without magnification and instrumentations requiring physician skills Consent: Discussed benefits and risks of procedure and verbal consent obtained Procedure: Patient was prepped for the procedure. Utilized an otoscope to assess and take note  of the ear canal, the tympanic membrane, and the presence, amount, and placement of the cerumen. I used alligator forceps to grasp the piece of cerumen sitting against the tympanic membrane, and it was removed uneventfully. Post procedure examination: shows cerumen was completely removed. Patient tolerated procedure well. The patient is made aware that they may experience temporary vertigo, temporary hearing loss, and temporary discomfort. If these symptom last for more than 24 hours to call the clinic or proceed to the ED.  Impression and Recommendations:    Acute bronchitis Overall doing significantly better. He simply needs a new work note. I was also able to remove a piece of cerumen sitting against his right tympanic membrane with alligator forceps.

## 2016-05-19 ENCOUNTER — Other Ambulatory Visit: Payer: Self-pay | Admitting: Sports Medicine

## 2016-07-09 ENCOUNTER — Other Ambulatory Visit: Payer: Self-pay | Admitting: Sports Medicine

## 2016-07-09 DIAGNOSIS — E781 Pure hyperglyceridemia: Secondary | ICD-10-CM

## 2016-07-20 ENCOUNTER — Other Ambulatory Visit: Payer: Self-pay | Admitting: Sports Medicine

## 2016-08-07 ENCOUNTER — Other Ambulatory Visit: Payer: Self-pay | Admitting: Sports Medicine

## 2016-11-01 ENCOUNTER — Other Ambulatory Visit: Payer: Self-pay | Admitting: Sports Medicine

## 2016-11-01 DIAGNOSIS — E781 Pure hyperglyceridemia: Secondary | ICD-10-CM

## 2016-11-14 ENCOUNTER — Encounter: Payer: BC Managed Care – PPO | Admitting: Sports Medicine

## 2016-11-23 ENCOUNTER — Ambulatory Visit (INDEPENDENT_AMBULATORY_CARE_PROVIDER_SITE_OTHER): Payer: BC Managed Care – PPO | Admitting: Sports Medicine

## 2016-11-23 ENCOUNTER — Ambulatory Visit (INDEPENDENT_AMBULATORY_CARE_PROVIDER_SITE_OTHER): Payer: BC Managed Care – PPO

## 2016-11-23 ENCOUNTER — Encounter: Payer: Self-pay | Admitting: Sports Medicine

## 2016-11-23 VITALS — BP 112/77 | HR 59 | Resp 16 | Ht 70.0 in | Wt 204.9 lb

## 2016-11-23 DIAGNOSIS — R140 Abdominal distension (gaseous): Secondary | ICD-10-CM | POA: Diagnosis not present

## 2016-11-23 DIAGNOSIS — M545 Low back pain, unspecified: Secondary | ICD-10-CM

## 2016-11-23 DIAGNOSIS — Z0000 Encounter for general adult medical examination without abnormal findings: Secondary | ICD-10-CM | POA: Diagnosis not present

## 2016-11-23 DIAGNOSIS — E119 Type 2 diabetes mellitus without complications: Secondary | ICD-10-CM

## 2016-11-23 DIAGNOSIS — G8929 Other chronic pain: Secondary | ICD-10-CM

## 2016-11-23 LAB — COMPREHENSIVE METABOLIC PANEL
AST: 24 U/L (ref 10–40)
Albumin: 4.4 g/dL (ref 3.6–5.1)
CO2: 21 mmol/L (ref 20–31)
Calcium: 9.6 mg/dL (ref 8.6–10.3)
Potassium: 4.2 mmol/L (ref 3.5–5.3)
Sodium: 140 mmol/L (ref 135–146)
Total Bilirubin: 0.6 mg/dL (ref 0.2–1.2)

## 2016-11-23 LAB — COMPREHENSIVE METABOLIC PANEL WITH GFR
ALT: 32 U/L (ref 9–46)
Alkaline Phosphatase: 34 U/L — ABNORMAL LOW (ref 40–115)
BUN: 14 mg/dL (ref 7–25)
Chloride: 104 mmol/L (ref 98–110)
Creat: 0.98 mg/dL (ref 0.60–1.35)
Glucose, Bld: 178 mg/dL — ABNORMAL HIGH (ref 65–99)
Total Protein: 7 g/dL (ref 6.1–8.1)

## 2016-11-23 LAB — LIPID PANEL W/REFLEX DIRECT LDL
Cholesterol: 140 mg/dL (ref <200)
HDL: 23 mg/dL — ABNORMAL LOW (ref >40)
LDL-Cholesterol: 90 mg/dL
Non-HDL Cholesterol (Calc): 117 mg/dL (ref <130)
Total CHOL/HDL Ratio: 6.1 Ratio — ABNORMAL HIGH (ref <5.0)
Triglycerides: 172 mg/dL — ABNORMAL HIGH (ref <150)

## 2016-11-23 LAB — CBC
HCT: 47 % (ref 38.5–50.0)
Hemoglobin: 15.5 g/dL (ref 13.2–17.1)
MCH: 30.7 pg (ref 27.0–33.0)
MCHC: 33 g/dL (ref 32.0–36.0)
MCV: 93.1 fL (ref 80.0–100.0)
MPV: 10.3 fL (ref 7.5–12.5)
Platelets: 299 10*3/uL (ref 140–400)
RBC: 5.05 MIL/uL (ref 4.20–5.80)
RDW: 12.9 % (ref 11.0–15.0)
WBC: 8.8 K/uL (ref 3.8–10.8)

## 2016-11-23 LAB — TSH: TSH: 2.05 m[IU]/L (ref 0.40–4.50)

## 2016-11-23 NOTE — Progress Notes (Signed)
  Subjective:    CC: Annual physical  HPI:  Draedyn's here for his routine physical.  Low back pain: Present for over 2 years, axial, discogenic, no bowel or bladder dysfunction, saddle numbness, constitutional symptoms is certainly.  Abdominal bloating: Really not associated with any food, mild, mother has been reading some articles and has simply scared herself. No nausea, vomiting, diarrhea, constipation. No fevers, chills, night sweats, weight loss.  Past medical history:  Negative.  See flowsheet/record as well for more information.  Surgical history: Negative.  See flowsheet/record as well for more information.  Family history: Negative.  See flowsheet/record as well for more information.  Social history: Negative.  See flowsheet/record as well for more information.  Allergies, and medications have been entered into the medical record, reviewed, and no changes needed.    Review of Systems: No headache, visual changes, nausea, vomiting, diarrhea, constipation, dizziness, abdominal pain, skin rash, fevers, chills, night sweats, swollen lymph nodes, weight loss, chest pain, body aches, joint swelling, muscle aches, shortness of breath, mood changes, visual or auditory hallucinations.  Objective:    General: Well Developed, well nourished, and in no acute distress.  Neuro: Alert and oriented x3, extra-ocular muscles intact, sensation grossly intact. Cranial nerves II through XII are intact, motor, sensory, and coordinative functions are all intact. HEENT: Normocephalic, atraumatic, pupils equal round reactive to light, neck supple, no masses, no lymphadenopathy, thyroid nonpalpable. Oropharynx, nasopharynx, external ear canals are unremarkable. Skin: Warm and dry, no rashes noted.  Cardiac: Regular rate and rhythm, no murmurs rubs or gallops.  Respiratory: Clear to auscultation bilaterally. Not using accessory muscles, speaking in full sentences.  Abdominal: Soft, nontender,  nondistended, positive bowel sounds, no masses, no organomegaly.  Musculoskeletal: Shoulder, elbow, wrist, hip, knee, ankle stable, and with full range of motion.  Impression and Recommendations:    The patient was counselled, risk factors were discussed, anticipatory guidance given.  Low back pain Home rehabilitation exercises, x-rays. Return if no better in 6 weeks.  Annual physical exam Annual physical as above, checking routine blood work.  Diabetes mellitus, type 2 History well-controlled on XigDuo and Trulicity. Checking routine diabetes labs  Abdominal bloating Abdominal x-ray, symptoms sound benign.  We will keep an eye on this.

## 2016-11-23 NOTE — Assessment & Plan Note (Signed)
Home rehabilitation exercises, x-rays. Return if no better in 6 weeks.

## 2016-11-23 NOTE — Assessment & Plan Note (Signed)
Abdominal x-ray, symptoms sound benign.  We will keep an eye on this.

## 2016-11-23 NOTE — Assessment & Plan Note (Signed)
Annual physical as above, checking routine blood work.  

## 2016-11-23 NOTE — Assessment & Plan Note (Signed)
History well-controlled on XigDuo and Trulicity. Checking routine diabetes labs

## 2016-11-24 LAB — VITAMIN D 25 HYDROXY (VIT D DEFICIENCY, FRACTURES): Vit D, 25-Hydroxy: 22 ng/mL — ABNORMAL LOW (ref 30–100)

## 2016-11-24 LAB — HEMOGLOBIN A1C
Hgb A1c MFr Bld: 7.8 % — ABNORMAL HIGH (ref <5.7)
Mean Plasma Glucose: 177 mg/dL

## 2016-11-24 LAB — MICROALBUMIN / CREATININE URINE RATIO
Creatinine, Urine: 133 mg/dL (ref 20–370)
Microalb Creat Ratio: 4 mcg/mg creat (ref <30)
Microalb, Ur: 0.5 mg/dL

## 2016-11-24 LAB — HIV ANTIBODY (ROUTINE TESTING W REFLEX): HIV 1&2 Ab, 4th Generation: NONREACTIVE

## 2016-11-24 MED ORDER — VITAMIN D (ERGOCALCIFEROL) 1.25 MG (50000 UNIT) PO CAPS: 50000.0000 [IU] | ORAL_CAPSULE | ORAL | 0 refills | Status: DC

## 2016-11-24 NOTE — Addendum Note (Signed)
Addended by: Monica BectonHEKKEKANDAM, THOMAS J on: 11/24/2016 08:29 AM   Modules accepted: Orders

## 2016-12-19 ENCOUNTER — Encounter: Payer: Self-pay | Admitting: Sports Medicine

## 2016-12-19 ENCOUNTER — Telehealth: Payer: Self-pay | Admitting: Sports Medicine

## 2016-12-19 ENCOUNTER — Ambulatory Visit (INDEPENDENT_AMBULATORY_CARE_PROVIDER_SITE_OTHER): Payer: BC Managed Care – PPO | Admitting: Sports Medicine

## 2016-12-19 DIAGNOSIS — M545 Low back pain, unspecified: Secondary | ICD-10-CM

## 2016-12-19 DIAGNOSIS — G8929 Other chronic pain: Secondary | ICD-10-CM

## 2016-12-19 NOTE — Telephone Encounter (Signed)
-----   Message from Eugene Garnetarolyn S Chandler sent at 12/19/2016  3:05 PM EDT ----- Regarding: Derrick Carter is coming Saturday, 12/30/16, for his MRI.   He is going on vacation this weekend.  Thanks, Eber Jonesarolyn

## 2016-12-19 NOTE — Progress Notes (Signed)
  Subjective:    CC: Follow-up  HPI: Low back pain: Axial, discogenic, nothing radicular, had this point has not responded to steroids, NSAIDs, muscle relaxers, physical therapy for greater than 6 weeks, x-rays were unrevealing.  Past medical history:  Negative.  See flowsheet/record as well for more information.  Surgical history: Negative.  See flowsheet/record as well for more information.  Family history: Negative.  See flowsheet/record as well for more information.  Social history: Negative.  See flowsheet/record as well for more information.  Allergies, and medications have been entered into the medical record, reviewed, and no changes needed.   Review of Systems: No fevers, chills, night sweats, weight loss, chest pain, or shortness of breath.   Objective:    General: Well Developed, well nourished, and in no acute distress.  Neuro: Alert and oriented x3, extra-ocular muscles intact, sensation grossly intact.  HEENT: Normocephalic, atraumatic, pupils equal round reactive to light, neck supple, no masses, no lymphadenopathy, thyroid nonpalpable.  Skin: Warm and dry, no rashes. Cardiac: Regular rate and rhythm, no murmurs rubs or gallops, no lower extremity edema.  Respiratory: Clear to auscultation bilaterally. Not using accessory muscles, speaking in full sentences.  Impression and Recommendations:    Low back pain Persistent axial low back pain, in spite of greater than 6 weeks of physician directed rehabilitation, unremarkable x-rays, proceeding to MRI. [facility]  I spent 25 minutes with this patient, greater than 50% was face-to-face time counseling regarding the above diagnoses

## 2016-12-19 NOTE — Assessment & Plan Note (Signed)
Persistent axial low back pain, in spite of greater than 6 weeks of physician directed rehabilitation, unremarkable x-rays, proceeding to MRI. [facility].

## 2016-12-30 ENCOUNTER — Ambulatory Visit (HOSPITAL_BASED_OUTPATIENT_CLINIC_OR_DEPARTMENT_OTHER)
Admission: RE | Admit: 2016-12-30 | Discharge: 2016-12-30 | Disposition: A | Discharge status: Home / Self Care | Payer: BC Managed Care – PPO | Source: Ambulatory Visit | Attending: Sports Medicine | Admitting: Sports Medicine

## 2016-12-30 DIAGNOSIS — M545 Low back pain: Secondary | ICD-10-CM | POA: Insufficient documentation

## 2016-12-30 DIAGNOSIS — Q7649 Other congenital malformations of spine, not associated with scoliosis: Secondary | ICD-10-CM | POA: Diagnosis not present

## 2016-12-30 DIAGNOSIS — G8929 Other chronic pain: Secondary | ICD-10-CM

## 2017-01-22 ENCOUNTER — Encounter: Payer: Self-pay | Admitting: Sports Medicine

## 2017-01-22 ENCOUNTER — Ambulatory Visit (INDEPENDENT_AMBULATORY_CARE_PROVIDER_SITE_OTHER): Payer: BC Managed Care – PPO | Admitting: Sports Medicine

## 2017-01-22 DIAGNOSIS — G8929 Other chronic pain: Secondary | ICD-10-CM | POA: Diagnosis not present

## 2017-01-22 DIAGNOSIS — M545 Low back pain, unspecified: Secondary | ICD-10-CM

## 2017-01-22 MED ORDER — DICLOFENAC SODIUM 75 MG PO TBEC: 75.0000 mg | DELAYED_RELEASE_TABLET | Freq: Two times a day (BID) | ORAL | 3 refills | Status: DC

## 2017-01-22 NOTE — Progress Notes (Signed)
  Subjective:    CC: MRI follow-up  HPI: This is a pleasant 26 year old male, we have been working on his low back pain, he has been resistant to do physical therapy. He failed conservative measures we obtained an MRI of the results of which will be dictated below, continues to be somewhat resistant to doing physical therapy. He would like to come back for custom orthotics.  Past medical history:  Negative.  See flowsheet/record as well for more information.  Surgical history: Negative.  See flowsheet/record as well for more information.  Family history: Negative.  See flowsheet/record as well for more information.  Social history: Negative.  See flowsheet/record as well for more information.  Allergies, and medications have been entered into the medical record, reviewed, and no changes needed.   Review of Systems: No fevers, chills, night sweats, weight loss, chest pain, or shortness of breath.   Objective:    General: Well Developed, well nourished, and in no acute distress.  Neuro: Alert and oriented x3, extra-ocular muscles intact, sensation grossly intact.  HEENT: Normocephalic, atraumatic, pupils equal round reactive to light, neck supple, no masses, no lymphadenopathy, thyroid nonpalpable.  Skin: Warm and dry, no rashes. Cardiac: Regular rate and rhythm, no murmurs rubs or gallops, no lower extremity edema.  Respiratory: Clear to auscultation bilaterally. Not using accessory muscles, speaking in full sentences.  MRI personally reviewed, with the exception of a soft call of L5-S1 facet degenerative changes, the MRI is totally negative.  Impression and Recommendations:    Low back pain Normal MRI, switching to Voltaren, patient declines physical therapy. He is also going to get some new boots to wear work, needs a note authorizing this, and is going to return for custom orthotics.  I spent 25 minutes with this patient, greater than 50% was face-to-face time counseling regarding  the above diagnoses

## 2017-01-22 NOTE — Assessment & Plan Note (Signed)
Normal MRI, switching to Voltaren, patient declines physical therapy. He is also going to get some new boots to wear work, needs a note authorizing this, and is going to return for custom orthotics.

## 2017-01-23 ENCOUNTER — Other Ambulatory Visit: Payer: Self-pay | Admitting: Sports Medicine

## 2017-01-23 DIAGNOSIS — M545 Low back pain: Principal | ICD-10-CM

## 2017-01-23 DIAGNOSIS — G8929 Other chronic pain: Secondary | ICD-10-CM

## 2017-01-30 ENCOUNTER — Telehealth: Payer: Self-pay | Admitting: Sports Medicine

## 2017-01-30 NOTE — Telephone Encounter (Signed)
Patient called would like to know if he can get a referral to a pain management office. The pills you prescribed are not helping. Please Adv. Thanks

## 2017-01-30 NOTE — Telephone Encounter (Signed)
Chronic narcotics are not the answer here, if Voltaren is not helping, he needs to return for the orthotics likely discussed, and he needs to finally agree to do formal physical therapy. No one is going to give him a narcotic controlled substance. Certainly we could try neuropathic agents at the follow-up visit.

## 2017-01-30 NOTE — Telephone Encounter (Signed)
Pt's mother advised. Questions what else there is to do. Advised the Pt needs to come in for custom orthotics and needs to get in for formal physical therapy. States she will advise Pt, and have him call office. No further questions.

## 2017-01-30 NOTE — Telephone Encounter (Signed)
Called and spoke with Pt's mother, was advised the Pt is not trying to get narcotics. He wants to get an epidural injection or cortisone injection. Will route for review.

## 2017-01-30 NOTE — Telephone Encounter (Signed)
What would the injection be for?  There is no degenerative disc disease and no facet arthritis.

## 2017-02-16 ENCOUNTER — Telehealth: Payer: Self-pay | Admitting: Sports Medicine

## 2017-02-16 DIAGNOSIS — M545 Low back pain, unspecified: Secondary | ICD-10-CM

## 2017-02-16 DIAGNOSIS — G8929 Other chronic pain: Secondary | ICD-10-CM

## 2017-02-16 NOTE — Telephone Encounter (Addendum)
Dr T: Pt called. He wants to be referred to a Dr. Laurian Brim for his back pain.

## 2017-02-16 NOTE — Telephone Encounter (Signed)
That's at [facility].  That practice went out of business.

## 2017-02-19 NOTE — Telephone Encounter (Signed)
Dr Laurian Brim is now at [facility]( I confirmed his location) phone # [phone]/fax [phone]. Mom wants Adriene to get a second opinion.

## 2017-02-19 NOTE — Telephone Encounter (Signed)
Referral placed.

## 2017-03-23 DIAGNOSIS — M7918 Myalgia, other site: Secondary | ICD-10-CM | POA: Insufficient documentation

## 2017-03-23 DIAGNOSIS — G894 Chronic pain syndrome: Secondary | ICD-10-CM

## 2017-03-23 DIAGNOSIS — G8929 Other chronic pain: Secondary | ICD-10-CM

## 2017-03-23 DIAGNOSIS — M47814 Spondylosis without myelopathy or radiculopathy, thoracic region: Secondary | ICD-10-CM | POA: Insufficient documentation

## 2017-03-23 HISTORY — DX: Myalgia, other site: M79.18

## 2017-03-23 HISTORY — DX: Other chronic pain: G89.29

## 2017-03-23 HISTORY — DX: Spondylosis without myelopathy or radiculopathy, thoracic region: M47.814

## 2017-03-23 HISTORY — DX: Chronic pain syndrome: G89.4

## 2017-05-13 IMAGING — CR DG WRIST COMPLETE 3+V*L*
4 series · 4 of 4 positions shown · non-contrast
Comparison: None.

CLINICAL DATA: 23-year-old male with a history of left wrist
injury.

EXAM:
LEFT WRIST - COMPLETE 3+ VIEW

[wrist pa]
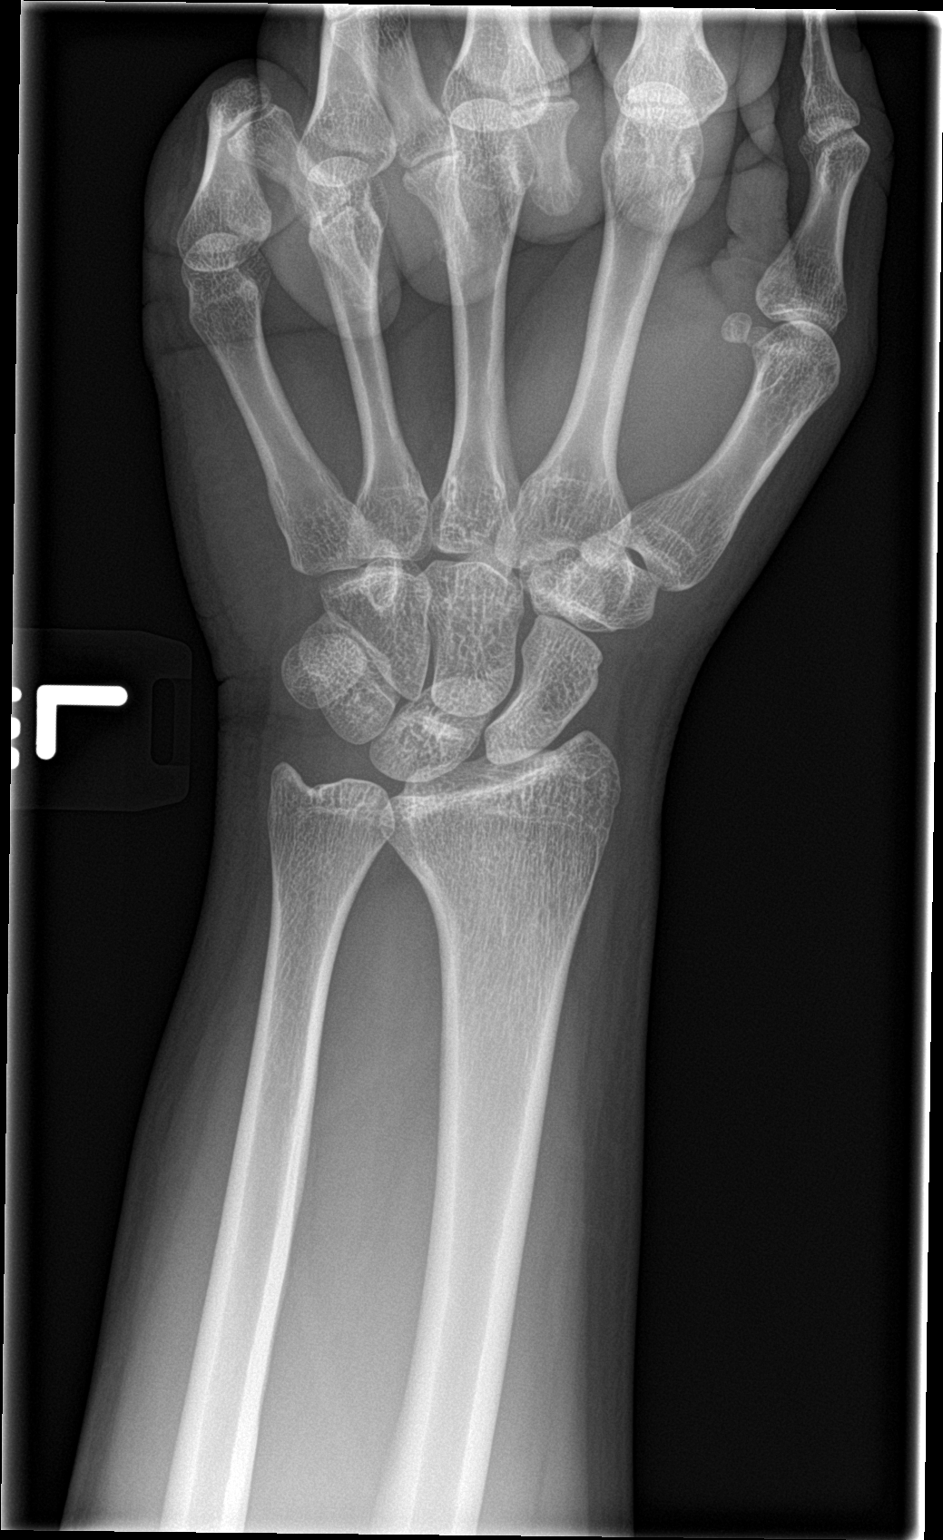

[wrist obl]
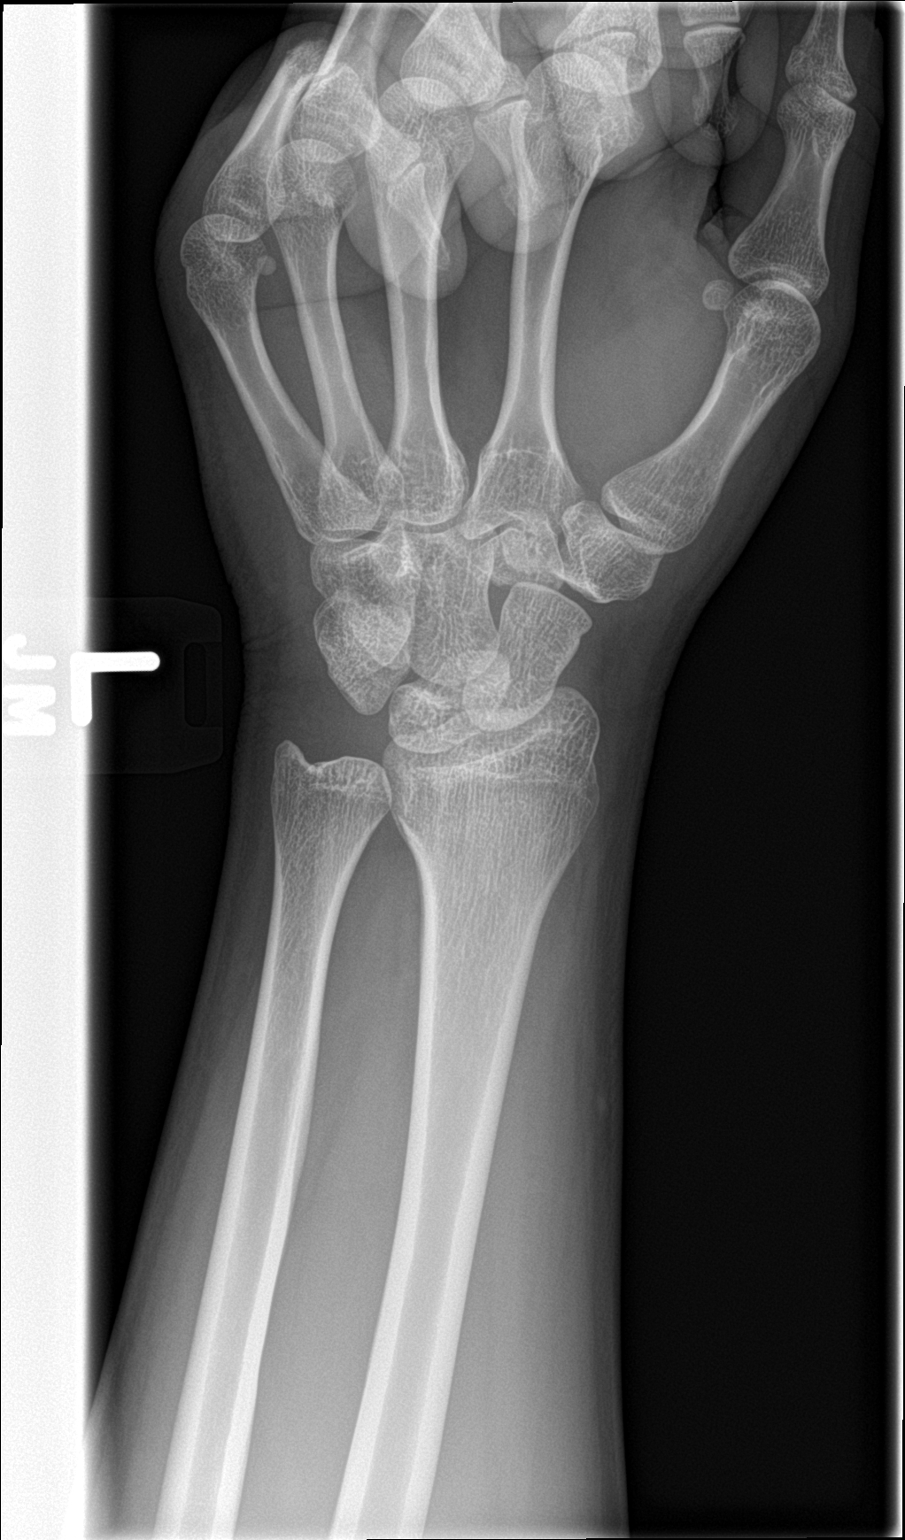

[wrist lat]
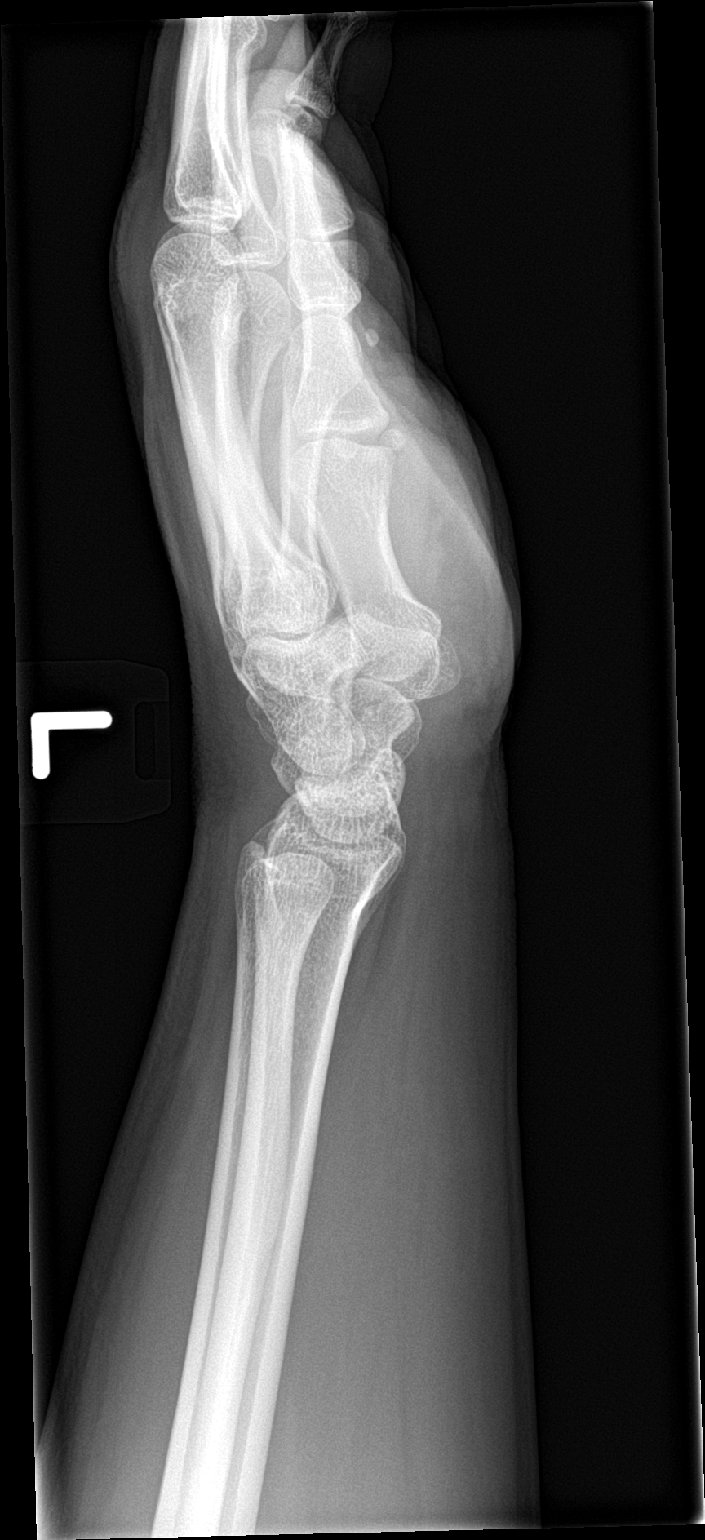

[wrist navicular]
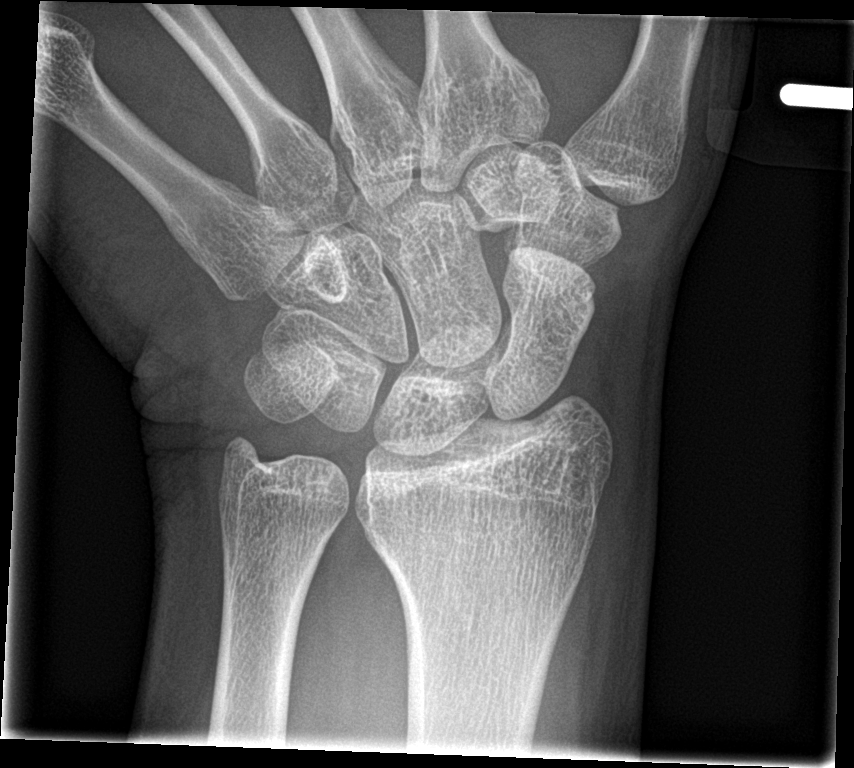

[4 of 4 positions shown; findings below may reference images not displayed]

FINDINGS: There is no evidence of fracture or dislocation. There is no
evidence of arthropathy or other focal bone abnormality. Soft
tissues are unremarkable.
IMPRESSION: Negative for acute bony abnormality.

## 2017-06-06 ENCOUNTER — Ambulatory Visit: Payer: BC Managed Care – PPO | Admitting: Sports Medicine

## 2017-06-06 ENCOUNTER — Ambulatory Visit (INDEPENDENT_AMBULATORY_CARE_PROVIDER_SITE_OTHER): Payer: BC Managed Care – PPO

## 2017-06-06 ENCOUNTER — Encounter: Payer: Self-pay | Admitting: Sports Medicine

## 2017-06-06 DIAGNOSIS — R0989 Other specified symptoms and signs involving the circulatory and respiratory systems: Secondary | ICD-10-CM | POA: Diagnosis not present

## 2017-06-06 DIAGNOSIS — B349 Viral infection, unspecified: Secondary | ICD-10-CM

## 2017-06-06 DIAGNOSIS — R05 Cough: Secondary | ICD-10-CM

## 2017-06-06 MED ORDER — HYDROCOD POLST-CPM POLST ER 10-8 MG/5ML PO SUER: 5.0000 mL | Freq: Two times a day (BID) | ORAL | 0 refills | Status: DC | PRN

## 2017-06-06 MED ORDER — DIPHENOXYLATE-ATROPINE 2.5-0.025 MG PO TABS: ORAL_TABLET | ORAL | 3 refills | Status: DC

## 2017-06-06 NOTE — Progress Notes (Signed)
  Subjective:    CC: Feeling sick  HPI: For the past several days this pleasant 26 year old male has had a sore throat, muscle aches, body aches, cough minimally productive, diarrhea, no hematochezia, melena, hematemesis.  Minimal sinus pain and pressure, no fevers at home.  Symptoms are moderate, persistent.  Past medical history:  Negative.  See flowsheet/record as well for more information.  Surgical history: Negative.  See flowsheet/record as well for more information.  Family history: Negative.  See flowsheet/record as well for more information.  Social history: Negative.  See flowsheet/record as well for more information.  Allergies, and medications have been entered into the medical record, reviewed, and no changes needed.   Review of Systems: No fevers, chills, night sweats, weight loss, chest pain, or shortness of breath.   Objective:    General: Well Developed, well nourished, and in no acute distress.  Neuro: Alert and oriented x3, extra-ocular muscles intact, sensation grossly intact.  HEENT: Normocephalic, atraumatic, pupils equal round reactive to light, neck supple, no masses, no lymphadenopathy, thyroid nonpalpable.  Oropharynx, nasopharynx and ear canals are unremarkable.  No tenderness over the frontal or maxillary sinuses. Skin: Warm and dry, no rashes. Cardiac: Regular rate and rhythm, no murmurs rubs or gallops, no lower extremity edema.  Respiratory: Minimal coarse sounds but moving good air. Not using accessory muscles, speaking in full sentences. Abdomen: Soft, nontender, nondistended, normal bowel sounds, no palpable masses, no guarding, rigidity, rebound tenderness.  Impression and Recommendations:    Viral syndrome He will get over-the-counter cold and flu medications, adding Tussionex, Lomotil. Chest x-ray. Out of work until next Wednesday.  ___________________________________________ Ihor Austinhomas J. Benjamin Stainhekkekandam, M.D., ABFM., CAQSM. Primary Care and Sports  Medicine [facility]  Adjunct Instructor of Family Medicine  [employer]

## 2017-06-06 NOTE — Assessment & Plan Note (Signed)
He will get over-the-counter cold and flu medications, adding Tussionex, Lomotil. Chest x-ray. Out of work until next Wednesday.

## 2017-06-12 ENCOUNTER — Other Ambulatory Visit: Payer: Self-pay | Admitting: Sports Medicine

## 2017-06-12 DIAGNOSIS — E781 Pure hyperglyceridemia: Secondary | ICD-10-CM

## 2017-06-18 ENCOUNTER — Other Ambulatory Visit: Payer: Self-pay | Admitting: Sports Medicine

## 2017-08-07 ENCOUNTER — Encounter: Payer: Self-pay | Admitting: Sports Medicine

## 2017-08-07 ENCOUNTER — Ambulatory Visit: Payer: BC Managed Care – PPO | Admitting: Sports Medicine

## 2017-08-07 VITALS — BP 112/71 | HR 54 | Resp 18 | Wt 196.0 lb

## 2017-08-07 DIAGNOSIS — M222X1 Patellofemoral disorders, right knee: Secondary | ICD-10-CM | POA: Diagnosis not present

## 2017-08-07 DIAGNOSIS — L84 Corns and callosities: Secondary | ICD-10-CM | POA: Insufficient documentation

## 2017-08-07 DIAGNOSIS — S83207A Unspecified tear of unspecified meniscus, current injury, left knee, initial encounter: Secondary | ICD-10-CM | POA: Insufficient documentation

## 2017-08-07 DIAGNOSIS — J310 Chronic rhinitis: Secondary | ICD-10-CM

## 2017-08-07 DIAGNOSIS — J00 Acute nasopharyngitis [common cold]: Secondary | ICD-10-CM

## 2017-08-07 DIAGNOSIS — M222X2 Patellofemoral disorders, left knee: Secondary | ICD-10-CM

## 2017-08-07 DIAGNOSIS — E119 Type 2 diabetes mellitus without complications: Secondary | ICD-10-CM

## 2017-08-07 DIAGNOSIS — M23307 Other meniscus derangements, unspecified meniscus, left knee: Secondary | ICD-10-CM | POA: Insufficient documentation

## 2017-08-07 HISTORY — DX: Corns and callosities: L84

## 2017-08-07 HISTORY — DX: Chronic rhinitis: J31.0

## 2017-08-07 HISTORY — DX: Patellofemoral disorders, right knee: M22.2X1

## 2017-08-07 LAB — POCT GLYCOSYLATED HEMOGLOBIN (HGB A1C): Hemoglobin A1C: 8.1

## 2017-08-07 MED ORDER — TRIAMCINOLONE ACETONIDE 55 MCG/ACT NA AERO: 2.0000 | INHALATION_SPRAY | Freq: Every day | NASAL | 12 refills | Status: DC

## 2017-08-07 MED ORDER — DICLOFENAC SODIUM 2 % TD SOLN: 2.0000 | Freq: Two times a day (BID) | TRANSDERMAL | 11 refills | Status: DC

## 2017-08-07 MED ORDER — LORATADINE 10 MG PO TABS: 10.0000 mg | ORAL_TABLET | Freq: Every day | ORAL | 3 refills | Status: DC

## 2017-08-07 MED ORDER — GLIPIZIDE 10 MG PO TABS: 10.0000 mg | ORAL_TABLET | Freq: Two times a day (BID) | ORAL | 3 refills | Status: DC

## 2017-08-07 NOTE — Assessment & Plan Note (Addendum)
Rechecking hemoglobin A1c, historically well controlled on XigDuo and Trulicity. Self discontinued Trulicity, refuses to do it again, adding glipizide. Diabetic foot exam today, he does have an appointment in April with Dr. Clearance CootsHarper for his diabetic eye exam. Recheck hemoglobin A1c in 3 months.

## 2017-08-07 NOTE — Assessment & Plan Note (Addendum)
There is a callus under his right foot bunionette. Adding a small dancers pad. He will return for a new set of custom molded orthotics, the previous set only lasted 3 months surprisingly.

## 2017-08-07 NOTE — Assessment & Plan Note (Signed)
Topical Pennsaid (diclofenac 2% topical) and rehab exercises given

## 2017-08-07 NOTE — Progress Notes (Signed)
Subjective:    CC: Several issues  HPI: Runny nose: Causing some sore throat, yellowish, no real sinus pain or pressure, no constitutional symptoms.  Flonase was not effective.  Bilateral knee pain: Localized anteriorly, under the kneecap, worse going up and down stairs, squatting.  No mechanical symptoms, no swelling, no trauma.  Diabetes mellitus type 2: Self discontinued Trulicity, hemoglobin A1c's have increased unfortunately.  No polyuria, polydipsia, polyphagia.  He was having some significant nausea but never brought it up to [place].  Callus: Right-sided, fifth metatarsal head.  Minimally painful.  He did have some custom orthotics but these fell apart after 3 months.  I reviewed the past medical history, family history, social history, surgical history, and allergies today and no changes were needed.  Please see the problem list section below in epic for further details.  Past Medical History: Past Medical History:  Diagnosis Date  . ADHD (attention deficit hyperactivity disorder)   . Dental crown present   . Diabetes mellitus without complication (HCC)   . Hyperlipidemia   . Pilonidal cyst 03/2013   Past Surgical History: Past Surgical History:  Procedure Laterality Date  . NO PAST SURGERIES    . PILONIDAL CYST EXCISION N/A 03/10/2013   Procedure: CYST EXCISION PILONIDAL SIMPLE I and D ;  Surgeon: Robyne Askew, MD;  Location: [facility];  Service: General;  Laterality: N/A;   Social History: Social History   Socioeconomic History  . Marital status: Single    Spouse name: None  . Number of children: None  . Years of education: None  . Highest education level: None  Social Needs  . Financial resource strain: None  . Food insecurity - worry: None  . Food insecurity - inability: None  . Transportation needs - medical: None  . Transportation needs - non-medical: None  Occupational History  . None  Tobacco Use  . Smoking status: Never Smoker  .  Smokeless tobacco: Never Used  Substance and Sexual Activity  . Alcohol use: No  . Drug use: No  . Sexual activity: No  Other Topics Concern  . None  Social History Narrative  . None   Family History: Family History  Problem Relation Age of Onset  . Hyperlipidemia Mother   . Diabetes Maternal Grandmother   . Hyperlipidemia Maternal Grandmother   . Hyperlipidemia Maternal Grandfather    Allergies: No Known Allergies Medications: See med rec.  Review of Systems: No fevers, chills, night sweats, weight loss, chest pain, or shortness of breath.   Objective:    General: Well Developed, well nourished, and in no acute distress.  Neuro: Alert and oriented x3, extra-ocular muscles intact, sensation grossly intact.  HEENT: Normocephalic, atraumatic, pupils equal round reactive to light, neck supple, no masses, no lymphadenopathy, thyroid nonpalpable.  Skin: Warm and dry, no rashes. Cardiac: Regular rate and rhythm, no murmurs rubs or gallops, no lower extremity edema.  Respiratory: Clear to auscultation bilaterally. Not using accessory muscles, speaking in full sentences. Right foot: Foot inspection and palpation reveals adequate appearance of both the transverse and longitudinal arches Abnormal callus is present under the fifth metatarsal head, minimally tender.  No skin breakdown. Hammer toes are absent. Tender to palpation under the fifth metatarsal head, there is a small bunionette.  Impression and Recommendations:    Patellofemoral syndrome, bilateral Topical Pennsaid (diclofenac 2% topical) and rehab exercises given  Rhinitis Nasacort, loratadine.   Callus of foot There is a callus under his right foot bunionette.  Adding a small dancers pad. He will return for a new set of custom molded orthotics, the previous set only lasted 3 months surprisingly.  Diabetes mellitus, type 2 Rechecking hemoglobin A1c, historically well controlled on XigDuo and Trulicity. Self  discontinued Trulicity, refuses to do it again, adding glipizide. Diabetic foot exam today, he does have an appointment in April with Dr. Clearance CootsHarper for his diabetic eye exam. Recheck hemoglobin A1c in 3 months.  ___________________________________________ Ihor Austinhomas J. Benjamin Stainhekkekandam, M.D., ABFM., CAQSM. Primary Care and Sports Medicine [facility]  Adjunct Instructor of Family Medicine  [employer]

## 2017-08-07 NOTE — Assessment & Plan Note (Signed)
Nasacort, loratadine.

## 2017-09-04 ENCOUNTER — Ambulatory Visit (INDEPENDENT_AMBULATORY_CARE_PROVIDER_SITE_OTHER): Payer: BC Managed Care – PPO

## 2017-09-04 ENCOUNTER — Ambulatory Visit: Payer: BC Managed Care – PPO | Admitting: Sports Medicine

## 2017-09-04 ENCOUNTER — Encounter: Payer: Self-pay | Admitting: Sports Medicine

## 2017-09-04 ENCOUNTER — Telehealth: Payer: Self-pay | Admitting: Sports Medicine

## 2017-09-04 DIAGNOSIS — M222X2 Patellofemoral disorders, left knee: Secondary | ICD-10-CM

## 2017-09-04 DIAGNOSIS — M222X1 Patellofemoral disorders, right knee: Secondary | ICD-10-CM

## 2017-09-04 DIAGNOSIS — F319 Bipolar disorder, unspecified: Secondary | ICD-10-CM | POA: Insufficient documentation

## 2017-09-04 DIAGNOSIS — G4726 Circadian rhythm sleep disorder, shift work type: Secondary | ICD-10-CM

## 2017-09-04 DIAGNOSIS — F39 Unspecified mood [affective] disorder: Secondary | ICD-10-CM | POA: Insufficient documentation

## 2017-09-04 HISTORY — DX: Circadian rhythm sleep disorder, shift work type: G47.26

## 2017-09-04 MED ORDER — DIPHENHYDRAMINE HCL 50 MG PO TABS: ORAL_TABLET | ORAL | 11 refills | Status: DC

## 2017-09-04 MED ORDER — MELOXICAM 15 MG PO TABS
ORAL_TABLET | ORAL | 3 refills | Status: DC
Start: 2017-09-04 — End: 2017-10-30

## 2017-09-04 NOTE — Assessment & Plan Note (Signed)
Works at 6 PM to 6 AM shift. During days where he does not work it is somewhat difficult for him to get to sleep, generally he gets to sleep by about 1:59 AM after listening to music from 6 PM until 2 AM, we will add a Benadryl 30 minutes before he would like to get to sleep, and then see how things go.

## 2017-09-04 NOTE — Telephone Encounter (Signed)
Pt called and stated he would like to be referred to [facility] instead of the PT here in our building, Thanks

## 2017-09-04 NOTE — Assessment & Plan Note (Signed)
No response to topical Pennsaid (diclofenac 2% topical). Adding formal physical therapy, meloxicam, x-rays. Return in 6 weeks, bilateral injections if no better.

## 2017-09-04 NOTE — Progress Notes (Signed)
Subjective:    CC: Follow-up  HPI: Patellofemoral syndrome: No better with home rehab exercises or topical Pennsaid (diclofenac 2% topical).  Pain is moderate, persistent, localized over the anterior knee, worse going up and down stairs, squatting.  Moderate, persistent without radiation, no swelling, no mechanical symptoms.  Difficulty sleeping: Works the 6 PM to 6 AM shift, when he gets home he often listens to music until he falls asleep at about 2 AM.  Unable to really sleep consistently.  I reviewed the past medical history, family history, social history, surgical history, and allergies today and no changes were needed.  Please see the problem list section below in epic for further details.  Past Medical History: Past Medical History:  Diagnosis Date  . ADHD (attention deficit hyperactivity disorder)   . Dental crown present   . Diabetes mellitus without complication (HCC)   . Hyperlipidemia   . Pilonidal cyst 03/2013   Past Surgical History: Past Surgical History:  Procedure Laterality Date  . NO PAST SURGERIES    . PILONIDAL CYST EXCISION N/A 03/10/2013   Procedure: CYST EXCISION PILONIDAL SIMPLE I and D ;  Surgeon: Robyne Askew, MD;  Location: [facility];  Service: General;  Laterality: N/A;   Social History: Social History   Socioeconomic History  . Marital status: Single    Spouse name: None  . Number of children: None  . Years of education: None  . Highest education level: None  Social Needs  . Financial resource strain: None  . Food insecurity - worry: None  . Food insecurity - inability: None  . Transportation needs - medical: None  . Transportation needs - non-medical: None  Occupational History  . None  Tobacco Use  . Smoking status: Never Smoker  . Smokeless tobacco: Never Used  Substance and Sexual Activity  . Alcohol use: No  . Drug use: No  . Sexual activity: No  Other Topics Concern  . None  Social History Narrative  . None     Family History: Family History  Problem Relation Age of Onset  . Hyperlipidemia Mother   . Diabetes Maternal Grandmother   . Hyperlipidemia Maternal Grandmother   . Hyperlipidemia Maternal Grandfather    Allergies: No Known Allergies Medications: See med rec.  Review of Systems: No fevers, chills, night sweats, weight loss, chest pain, or shortness of breath.   Objective:    General: Well Developed, well nourished, and in no acute distress.  Neuro: Alert and oriented x3, extra-ocular muscles intact, sensation grossly intact.  HEENT: Normocephalic, atraumatic, pupils equal round reactive to light, neck supple, no masses, no lymphadenopathy, thyroid nonpalpable.  Skin: Warm and dry, no rashes. Cardiac: Regular rate and rhythm, no murmurs rubs or gallops, no lower extremity edema.  Respiratory: Clear to auscultation bilaterally. Not using accessory muscles, speaking in full sentences. Bilateral knees: Normal to inspection with no erythema or effusion or obvious bony abnormalities. Palpation normal with no warmth or joint line tenderness or patellar tenderness or condyle tenderness. ROM normal in flexion and extension and lower leg rotation. Ligaments with solid consistent endpoints including ACL, PCL, LCL, MCL. Negative Mcmurray's and provocative meniscal tests. Non painful patellar compression. Patellar and quadriceps tendons unremarkable. Hamstring and quadriceps strength is normal.  Impression and Recommendations:    Patellofemoral syndrome, bilateral No response to topical Pennsaid (diclofenac 2% topical). Adding formal physical therapy, meloxicam, x-rays. Return in 6 weeks, bilateral injections if no better.  Shift work sleep disorder Works at  6 PM to 6 AM shift. During days where he does not work it is somewhat difficult for him to get to sleep, generally he gets to sleep by about 1:59 AM after listening to music from 6 PM until 2 AM, we will add a Benadryl 30 minutes  before he would like to get to sleep, and then see how things go. ___________________________________________ Ihor Austinhomas J. Benjamin Stainhekkekandam, M.D., ABFM., CAQSM. Primary Care and Sports Medicine [facility]  Adjunct Instructor of Family Medicine  [employer]

## 2017-09-04 NOTE — Telephone Encounter (Signed)
Referral changed

## 2017-09-07 ENCOUNTER — Other Ambulatory Visit: Payer: Self-pay

## 2017-09-07 DIAGNOSIS — E781 Pure hyperglyceridemia: Secondary | ICD-10-CM

## 2017-09-07 MED ORDER — FENOFIBRATE 160 MG PO TABS: 160.0000 mg | ORAL_TABLET | Freq: Every day | ORAL | 3 refills | Status: DC

## 2017-09-13 ENCOUNTER — Encounter: Payer: Self-pay | Admitting: Sports Medicine

## 2017-09-13 ENCOUNTER — Ambulatory Visit: Payer: BC Managed Care – PPO | Admitting: Sports Medicine

## 2017-09-13 DIAGNOSIS — M222X2 Patellofemoral disorders, left knee: Secondary | ICD-10-CM

## 2017-09-13 DIAGNOSIS — M222X1 Patellofemoral disorders, right knee: Secondary | ICD-10-CM

## 2017-09-13 DIAGNOSIS — L84 Corns and callosities: Secondary | ICD-10-CM

## 2017-09-13 NOTE — Assessment & Plan Note (Signed)
Big callus on of the right bunionette. Not better with a small dancers pad. Custom orthotics as above, I am also going to do a referral to podiatry, callus trimming.

## 2017-09-13 NOTE — Progress Notes (Signed)
    Patient was fitted for a : standard, cushioned, semi-rigid orthotic. The orthotic was heated and afterward the patient stood on the orthotic blank positioned on the orthotic stand. The patient was positioned in subtalar neutral position and 10 degrees of ankle dorsiflexion in a weight bearing stance. After completion of molding, a stable base was applied to the orthotic blank. The blank was ground to a stable position for weight bearing. Size: 12 Base: White EVA Additional Posting and Padding: None The patient ambulated these, and they were very comfortable.  I spent 40 minutes with this patient, greater than 50% was face-to-face time counseling regarding the below diagnosis.  ___________________________________________ Denetra Formoso J. Markeem Noreen, M.D., ABFM., CAQSM. Primary Care and Sports Medicine [facility] [facility]  Adjunct Instructor of Family Medicine  [employer]   

## 2017-09-13 NOTE — Assessment & Plan Note (Signed)
Continue meloxicam, needs physical therapy, custom orthotics as above. Return to see us in 6 weeks, bilateral injections if no better.

## 2017-09-27 ENCOUNTER — Ambulatory Visit: Payer: Self-pay | Admitting: Podiatry

## 2017-10-16 ENCOUNTER — Ambulatory Visit: Payer: BC Managed Care – PPO | Admitting: Sports Medicine

## 2017-10-16 ENCOUNTER — Encounter: Payer: Self-pay | Admitting: Sports Medicine

## 2017-10-16 DIAGNOSIS — M222X2 Patellofemoral disorders, left knee: Secondary | ICD-10-CM

## 2017-10-16 DIAGNOSIS — M222X1 Patellofemoral disorders, right knee: Secondary | ICD-10-CM | POA: Diagnosis not present

## 2017-10-16 NOTE — Progress Notes (Signed)
Subjective:    CC: Follow-up  HPI: Bilateral knee pain: Patellofemoral, unfortunately has failed NSAIDs, custom orthotics, rehab exercises at home, pain is moderate, persistent, localized at the kneecaps without radiation.  I reviewed the past medical history, family history, social history, surgical history, and allergies today and no changes were needed.  Please see the problem list section below in epic for further details.  Past Medical History: Past Medical History:  Diagnosis Date  . ADHD (attention deficit hyperactivity disorder)   . Dental crown present   . Diabetes mellitus without complication (HCC)   . Hyperlipidemia   . Pilonidal cyst 03/2013   Past Surgical History: Past Surgical History:  Procedure Laterality Date  . NO PAST SURGERIES    . PILONIDAL CYST EXCISION N/A 03/10/2013   Procedure: CYST EXCISION PILONIDAL SIMPLE I and D ;  Surgeon: Robyne Askew, MD;  Location: [facility];  Service: General;  Laterality: N/A;   Social History: Social History   Socioeconomic History  . Marital status: Single    Spouse name: Not on file  . Number of children: Not on file  . Years of education: Not on file  . Highest education level: Not on file  Occupational History  . Not on file  Social Needs  . Financial resource strain: Not on file  . Food insecurity:    Worry: Not on file    Inability: Not on file  . Transportation needs:    Medical: Not on file    Non-medical: Not on file  Tobacco Use  . Smoking status: Never Smoker  . Smokeless tobacco: Never Used  Substance and Sexual Activity  . Alcohol use: No  . Drug use: No  . Sexual activity: Never  Lifestyle  . Physical activity:    Days per week: Not on file    Minutes per session: Not on file  . Stress: Not on file  Relationships  . Social connections:    Talks on phone: Not on file    Gets together: Not on file    Attends religious service: Not on file    Active member of club or  organization: Not on file    Attends meetings of clubs or organizations: Not on file    Relationship status: Not on file  Other Topics Concern  . Not on file  Social History Narrative  . Not on file   Family History: Family History  Problem Relation Age of Onset  . Hyperlipidemia Mother   . Diabetes Maternal Grandmother   . Hyperlipidemia Maternal Grandmother   . Hyperlipidemia Maternal Grandfather    Allergies: No Known Allergies Medications: See med rec.  Review of Systems: No fevers, chills, night sweats, weight loss, chest pain, or shortness of breath.   Objective:    General: Well Developed, well nourished, and in no acute distress.  Neuro: Alert and oriented x3, extra-ocular muscles intact, sensation grossly intact.  HEENT: Normocephalic, atraumatic, pupils equal round reactive to light, neck supple, no masses, no lymphadenopathy, thyroid nonpalpable.  Skin: Warm and dry, no rashes. Cardiac: Regular rate and rhythm, no murmurs rubs or gallops, no lower extremity edema.  Respiratory: Clear to auscultation bilaterally. Not using accessory muscles, speaking in full sentences. Bilateral knees: Normal to inspection with no erythema or effusion or obvious bony abnormalities. Tender to palpation under the patellar facets ROM normal in flexion and extension and lower leg rotation. Ligaments with solid consistent endpoints including ACL, PCL, LCL, MCL. Negative Mcmurray's and  provocative meniscal tests. Non painful patellar compression. Patellar and quadriceps tendons unremarkable. Hamstring and quadriceps strength is normal.  Procedure: Real-time Ultrasound Guided Injection of left knee Device: GE Logiq E  Verbal informed consent obtained.  Time-out conducted.  Noted no overlying erythema, induration, or other signs of local infection.  Skin prepped in a sterile fashion.  Local anesthesia: Topical Ethyl chloride.  With sterile technique and under real time ultrasound  guidance: 1 cc Kenalog 40, 2 cc lidocaine, 2 cc bupivacaine injected easily. Completed without difficulty  Pain immediately resolved suggesting accurate placement of the medication.  Advised to call if fevers/chills, erythema, induration, drainage, or persistent bleeding.  Images permanently stored and available for review in the ultrasound unit.  Impression: Technically successful ultrasound guided injection.  Procedure: Real-time Ultrasound Guided Injection of right knee Device: GE Logiq E  Verbal informed consent obtained.  Time-out conducted.  Noted no overlying erythema, induration, or other signs of local infection.  Skin prepped in a sterile fashion.  Local anesthesia: Topical Ethyl chloride.  With sterile technique and under real time ultrasound guidance: 1 cc Kenalog 40, 2 cc lidocaine, 2 cc bupivacaine injected easily. Completed without difficulty  Pain immediately resolved suggesting accurate placement of the medication.  Advised to call if fevers/chills, erythema, induration, drainage, or persistent bleeding.  Images permanently stored and available for review in the ultrasound unit.  Impression: Technically successful ultrasound guided injection.  Impression and Recommendations:    Patellofemoral syndrome, bilateral No better with custom orthotics, NSAIDs, home physical therapy. Bilateral injections, return in 1 month, MR if no better, I would probably also put him into formal therapy.  ___________________________________________ Ihor Austinhomas J. Benjamin Stainhekkekandam, M.D., ABFM., CAQSM. Primary Care and Sports Medicine [facility]  Adjunct Instructor of Family Medicine  [employer]

## 2017-10-16 NOTE — Assessment & Plan Note (Signed)
No better with custom orthotics, NSAIDs, home physical therapy. Bilateral injections, return in 1 month, MR if no better, I would probably also put him into formal therapy.

## 2017-10-30 ENCOUNTER — Ambulatory Visit: Payer: BC Managed Care – PPO | Admitting: Sports Medicine

## 2017-10-30 ENCOUNTER — Other Ambulatory Visit: Payer: Self-pay | Admitting: Sports Medicine

## 2017-10-30 ENCOUNTER — Ambulatory Visit (INDEPENDENT_AMBULATORY_CARE_PROVIDER_SITE_OTHER): Payer: BC Managed Care – PPO

## 2017-10-30 ENCOUNTER — Encounter: Payer: Self-pay | Admitting: Sports Medicine

## 2017-10-30 DIAGNOSIS — M222X2 Patellofemoral disorders, left knee: Secondary | ICD-10-CM | POA: Diagnosis not present

## 2017-10-30 DIAGNOSIS — R0602 Shortness of breath: Secondary | ICD-10-CM | POA: Diagnosis not present

## 2017-10-30 DIAGNOSIS — M222X1 Patellofemoral disorders, right knee: Secondary | ICD-10-CM

## 2017-10-30 DIAGNOSIS — R079 Chest pain, unspecified: Secondary | ICD-10-CM | POA: Diagnosis not present

## 2017-10-30 DIAGNOSIS — E119 Type 2 diabetes mellitus without complications: Secondary | ICD-10-CM

## 2017-10-30 HISTORY — DX: Chest pain, unspecified: R07.9

## 2017-10-30 LAB — LIPID PANEL W/REFLEX DIRECT LDL
Cholesterol: 149 mg/dL (ref <200)
HDL: 38 mg/dL — ABNORMAL LOW (ref >40)
LDL Cholesterol (Calc): 92 mg/dL (calc)
Non-HDL Cholesterol (Calc): 111 mg/dL (ref <130)
Total CHOL/HDL Ratio: 3.9 (calc) (ref <5.0)
Triglycerides: 99 mg/dL (ref <150)

## 2017-10-30 LAB — COMPREHENSIVE METABOLIC PANEL WITH GFR
AG Ratio: 1.7 (calc) (ref 1.0–2.5)
Albumin: 4.7 g/dL (ref 3.6–5.1)
CO2: 29 mmol/L (ref 20–32)
Chloride: 104 mmol/L (ref 98–110)
Creat: 1.01 mg/dL (ref 0.60–1.35)
Globulin: 2.7 g/dL (ref 1.9–3.7)
Glucose, Bld: 121 mg/dL — ABNORMAL HIGH (ref 65–99)
Potassium: 4 mmol/L (ref 3.5–5.3)
Sodium: 141 mmol/L (ref 135–146)
Total Protein: 7.4 g/dL (ref 6.1–8.1)

## 2017-10-30 LAB — COMPREHENSIVE METABOLIC PANEL
ALT: 21 U/L (ref 9–46)
AST: 18 U/L (ref 10–40)
Alkaline phosphatase (APISO): 31 U/L — ABNORMAL LOW (ref 40–115)
BUN: 19 mg/dL (ref 7–25)
Calcium: 9.5 mg/dL (ref 8.6–10.3)
Total Bilirubin: 0.7 mg/dL (ref 0.2–1.2)

## 2017-10-30 LAB — CBC
HCT: 48.9 % (ref 38.5–50.0)
Hemoglobin: 16.6 g/dL (ref 13.2–17.1)
MCH: 30.7 pg (ref 27.0–33.0)
MCHC: 33.9 g/dL (ref 32.0–36.0)
MCV: 90.6 fL (ref 80.0–100.0)
MPV: 9.7 fL (ref 7.5–12.5)
Platelets: 359 10*3/uL (ref 140–400)
RBC: 5.4 Million/uL (ref 4.20–5.80)
RDW: 12.7 % (ref 11.0–15.0)
WBC: 12.8 Thousand/uL — ABNORMAL HIGH (ref 3.8–10.8)

## 2017-10-30 LAB — TSH: TSH: 3.92 m[IU]/L (ref 0.40–4.50)

## 2017-10-30 MED ORDER — MONTELUKAST SODIUM 10 MG PO TABS: 10.0000 mg | ORAL_TABLET | Freq: Every day | ORAL | 3 refills | Status: DC

## 2017-10-30 NOTE — Assessment & Plan Note (Signed)
Anterior, has failed bilateral injections, rehab exercises for greater than 6 weeks. Ordering bilateral MRIs.

## 2017-10-30 NOTE — Assessment & Plan Note (Signed)
Unclear etiology, he does get the pain during ambulation, the last 12 hours, not really better with rest, no other features consistent with apical anginal pains of this is likely atypical chest pain. Checking routine labs, I would also like him to have a treadmill stress test all for risk stratification. He does have diabetes.

## 2017-10-30 NOTE — Assessment & Plan Note (Signed)
Unclear etiology, vague symptoms. Chest x-ray, d-dimer, other labs. Return for pre-and postbronchodilator spirometry, adding Singulair.

## 2017-10-30 NOTE — Progress Notes (Signed)
Subjective:    CC: Multiple issues  HPI: Bilateral knee pain: Persistent despite injections, greater than 6 weeks of physical therapy, no mechanical symptoms but does have significant anterior knee pain that is persistent.  Shortness of breath and chest pain: Chest pain is substernal with radiation to the back, no radiation to the arm, diaphoresis, palpitations, occurs randomly but sometimes with walking.  Lasts for about 12 hours.  In addition he has some shortness of breath, difficulty catching his breath, this is been present for 2 months now.  Decreased exercise tolerance, no periods of immobilization.  No runny nose, sore throat, cough.  I reviewed the past medical history, family history, social history, surgical history, and allergies today and no changes were needed.  Please see the problem list section below in epic for further details.  Past Medical History: Past Medical History:  Diagnosis Date  . ADHD (attention deficit hyperactivity disorder)   . Dental crown present   . Diabetes mellitus without complication (HCC)   . Hyperlipidemia   . Pilonidal cyst 03/2013   Past Surgical History: Past Surgical History:  Procedure Laterality Date  . NO PAST SURGERIES    . PILONIDAL CYST EXCISION N/A 03/10/2013   Procedure: CYST EXCISION PILONIDAL SIMPLE I and D ;  Surgeon: Robyne Askew, MD;  Location: [facility];  Service: General;  Laterality: N/A;   Social History: Social History   Socioeconomic History  . Marital status: Single    Spouse name: Not on file  . Number of children: Not on file  . Years of education: Not on file  . Highest education level: Not on file  Occupational History  . Not on file  Social Needs  . Financial resource strain: Not on file  . Food insecurity:    Worry: Not on file    Inability: Not on file  . Transportation needs:    Medical: Not on file    Non-medical: Not on file  Tobacco Use  . Smoking status: Never Smoker  .  Smokeless tobacco: Never Used  Substance and Sexual Activity  . Alcohol use: No  . Drug use: No  . Sexual activity: Never  Lifestyle  . Physical activity:    Days per week: Not on file    Minutes per session: Not on file  . Stress: Not on file  Relationships  . Social connections:    Talks on phone: Not on file    Gets together: Not on file    Attends religious service: Not on file    Active member of club or organization: Not on file    Attends meetings of clubs or organizations: Not on file    Relationship status: Not on file  Other Topics Concern  . Not on file  Social History Narrative  . Not on file   Family History: Family History  Problem Relation Age of Onset  . Hyperlipidemia Mother   . Diabetes Maternal Grandmother   . Hyperlipidemia Maternal Grandmother   . Hyperlipidemia Maternal Grandfather    Allergies: No Known Allergies Medications: See med rec.  Review of Systems: No fevers, chills, night sweats, weight loss, chest pain, or shortness of breath.   Objective:    General: Well Developed, well nourished, and in no acute distress.  Neuro: Alert and oriented x3, extra-ocular muscles intact, sensation grossly intact.  HEENT: Normocephalic, atraumatic, pupils equal round reactive to light, neck supple, no masses, no lymphadenopathy, thyroid nonpalpable.  Skin: Warm and dry,  no rashes. Cardiac: Regular rate and rhythm, no murmurs rubs or gallops, no lower extremity edema.  Respiratory: Slightly coarse sounds in the left base. Not using accessory muscles, speaking in full sentences.  Impression and Recommendations:    Chest pain Unclear etiology, he does get the pain during ambulation, the last 12 hours, not really better with rest, no other features consistent with apical anginal pains of this is likely atypical chest pain. Checking routine labs, I would also like him to have a treadmill stress test all for risk stratification. He does have  diabetes.  Shortness of breath Unclear etiology, vague symptoms. Chest x-ray, d-dimer, other labs. Return for pre-and postbronchodilator spirometry, adding Singulair.  Patellofemoral syndrome, bilateral Anterior, has failed bilateral injections, rehab exercises for greater than 6 weeks. Ordering bilateral MRIs. ___________________________________________ Ihor Austin. Benjamin Stain, M.D., ABFM., CAQSM. Primary Care and Sports Medicine [facility]  Adjunct Instructor of Family Medicine  [employer]

## 2017-10-31 LAB — HEMOGLOBIN A1C
Hgb A1c MFr Bld: 6.5 % of total Hgb — ABNORMAL HIGH (ref <5.7)
Mean Plasma Glucose: 140 (calc)
eAG (mmol/L): 7.7 (calc)

## 2017-10-31 LAB — D-DIMER, QUANTITATIVE: D-Dimer, Quant: 0.23 ug{FEU}/mL (ref <0.50)

## 2017-11-08 LAB — HM DIABETES EYE EXAM

## 2017-11-20 ENCOUNTER — Encounter: Payer: Self-pay | Admitting: Sports Medicine

## 2017-11-22 ENCOUNTER — Ambulatory Visit (INDEPENDENT_AMBULATORY_CARE_PROVIDER_SITE_OTHER): Payer: BC Managed Care – PPO | Admitting: Sports Medicine

## 2017-11-22 ENCOUNTER — Encounter: Payer: Self-pay | Admitting: Sports Medicine

## 2017-11-22 VITALS — BP 123/70 | HR 68 | Ht 71.0 in | Wt 212.0 lb

## 2017-11-22 DIAGNOSIS — J849 Interstitial pulmonary disease, unspecified: Secondary | ICD-10-CM

## 2017-11-22 DIAGNOSIS — R942 Abnormal results of pulmonary function studies: Secondary | ICD-10-CM

## 2017-11-22 DIAGNOSIS — R0602 Shortness of breath: Secondary | ICD-10-CM | POA: Diagnosis not present

## 2017-11-22 LAB — PULMONARY FUNCTION TEST

## 2017-11-22 MED ORDER — ALBUTEROL SULFATE (2.5 MG/3ML) 0.083% IN NEBU
2.5000 mg | INHALATION_SOLUTION | Freq: Once | RESPIRATORY_TRACT | Status: AC
  Administered 2017-11-22: 2.5 mg via RESPIRATORY_TRACT

## 2017-11-22 NOTE — Progress Notes (Signed)
   Derrick Carter is here for pre-and postbronchodilator spirometry for shortness of breath.  Please see the attached report.  Lung function test: FVC 73% predicted FEV1 76% predicted, FEV1/FVC 104% predicted, no significant change postbronchodilator.  Impression and Recommendations:    Shortness of breath Symptoms are very vague, chest x-ray, d-dimer, other labs were normal. Pre-and postbronchodilator spirometry shows mild restriction, likely from his weight. Considering the findings we are going to add a CT with IV contrast and if this is completely negative no further evaluation needed.  ___________________________________________ Ihor Austin. Benjamin Stain, M.D., ABFM., CAQSM. Primary Care and Sports Medicine [facility]  Adjunct Instructor of Family Medicine  [employer]

## 2017-11-22 NOTE — Addendum Note (Signed)
Addended by: Collie Siad on: 11/22/2017 03:14 PM   Modules accepted: Orders

## 2017-11-22 NOTE — Assessment & Plan Note (Signed)
Symptoms are very vague, chest x-ray, d-dimer, other labs were normal. Pre-and postbronchodilator spirometry shows mild restriction, likely from his weight. Considering the findings we are going to add a CT with IV contrast and if this is completely negative no further evaluation needed.

## 2017-11-27 ENCOUNTER — Ambulatory Visit (INDEPENDENT_AMBULATORY_CARE_PROVIDER_SITE_OTHER): Payer: BC Managed Care – PPO

## 2017-11-27 DIAGNOSIS — J849 Interstitial pulmonary disease, unspecified: Secondary | ICD-10-CM

## 2017-11-27 DIAGNOSIS — M222X1 Patellofemoral disorders, right knee: Secondary | ICD-10-CM | POA: Diagnosis not present

## 2017-11-27 DIAGNOSIS — M222X2 Patellofemoral disorders, left knee: Principal | ICD-10-CM

## 2017-11-27 DIAGNOSIS — R0602 Shortness of breath: Secondary | ICD-10-CM

## 2017-12-04 NOTE — Progress Notes (Signed)
Cardiology Office Note:    Date:  12/05/2017   ID:  Derrick RaringAnthony R Gotham, DOB 06/24/1991, MRN [id number]  PCP:  Monica Bectonhekkekandam, Thomas J, MD  Cardiologist:  Norman HerrlichBrian Sadat Sliwa, MD   Referring MD: Monica Bectonhekkekandam, Thomas J,*  ASSESSMENT:    1. Chest pain, unspecified type   2. Mixed hyperlipidemia   3. Type 2 diabetes mellitus with hyperglycemia, without long-term current use of insulin (HCC)    PLAN:    In order of problems listed above:  1. Despite his young age is an increased cardiovascular risk with family history of diabetes hyperlipidemia and for further evaluation undergo stress myocardial perfusion study.  If abnormal I would favor coronary angiography. 2. Stable continue combined statin fabric acid therapy 3. Stable continue current treatment on appropriate medications for cardiovascular benefit  Next appointment   Medication Adjustments/Labs and Tests Ordered: Current medicines are reviewed at length with the patient today.  Concerns regarding medicines are outlined above.  Orders Placed This Encounter  Procedures  . Myocardial Perfusion Imaging  . EKG 12-Lead   No orders of the defined types were placed in this encounter.    Chief Complaint  Patient presents with  . New Patient (Initial Visit)    to evaluate SHOB  . Chest Pain  . Shortness of Breath  6 weeks  History of Present Illness:    Derrick Raringnthony R Morones is a 27 y.o. male with T2 DM and hyperlipidemia who is being seen today for the evaluation of chest pain and SOB at the request of the patient and his mother. His mother had recent PCI and stenting for CAD.  He has had diabetes for several years and dyslipidemia treated with statin therapy acid derivative.  He has symptoms of shortness of breath that are progressed now occur with activity such as walking room to room climbing stairs and has associated chest tightness with it the episodes now occur several times a day they have not been severe and today they have been relieved  with rest.  He had a recent high-resolution CT scan of the chest that was normal for exertional shortness of breath has no history of cigarette smoking asthma or occupational lung disease.  Patient is mother concerned that he has premature coronary artery disease.  Chest pain is mild to moderate in severity no associated GI symptoms no radiation no diaphoresis.  He has associated shortness of breath he has no history of congenital or rheumatic heart disease  Past Medical History:  Diagnosis Date  . Acanthosis 08/24/2014  . ADHD (attention deficit hyperactivity disorder)   . Annual physical exam 04/28/2014  . Attention deficit hyperactivity disorder (ADHD) 02/04/2008   Overview:  ADHD, Combined Type  ICD-10 cut over   Last Assessment & Plan:  Relevant Hx: Course: Daily Update: Today's Plan: restart Strattera  . Callus of foot 08/07/2017  . Chest pain 10/30/2017  . Chronic myofascial pain 03/23/2017  . Chronic pain disorder 03/23/2017  . Dental crown present   . Diabetes mellitus without complication (HCC)   . Diabetes mellitus, type 2 (HCC) 04/29/2014  . Hyperlipidemia   . Hypertriglyceridemia 11/19/2012  . Insomnia 03/06/2014  . Low back pain 08/19/2015  . Migraine headache 05/19/2013  . Obesity (BMI 30-39.9) 03/28/2013  . Patellofemoral syndrome, bilateral 08/07/2017  . Pilonidal cyst 03/2013  . Pilonidal disease s/p I&D 03/10/2013 11/19/2012  . Restrictive pattern present on pulmonary function testing 10/30/2017   Lung function test 11/22/2017 FVC 73% predicted FEV1 76% predicted, FEV1/FVC 104% predicted,  no significant change postbronchodilator.  . Rhinitis 08/07/2017  . 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 Thoracic spondylosis without myelopathy 03/23/2017    Past Surgical History:  Procedure Laterality Date  . NO PAST SURGERIES    . PILONIDAL CYST EXCISION N/A 03/10/2013   Procedure: CYST EXCISION PILONIDAL SIMPLE I and D ;  Surgeon: Robyne Askew, MD;  Location: [facility];   Service: General;  Laterality: N/A;    Current Medications: Current Meds  Medication Sig  . atorvastatin (LIPITOR) 40 MG tablet TAKE 1 TABLET BY MOUTH DAILY  . diphenhydrAMINE (BENADRYL) 50 MG tablet One tab 30 minutes before desired sleep time  . fenofibrate 160 MG tablet Take 1 tablet (160 mg total) by mouth daily.  Marland Kitchen glipiZIDE (GLUCOTROL) 10 MG tablet TAKE 1 TABLET (10 MG TOTAL) BY MOUTH 2 (TWO) TIMES DAILY BEFORE A MEAL.  Marland Kitchen loratadine (CLARITIN) 10 MG tablet Take 1 tablet (10 mg total) by mouth daily.  Marland Kitchen XIGDUO XR 04-999 MG TB24 TAKE 1 TABLET BY MOUTH DAILY.     Allergies:   Patient has no known allergies.   Social History   Socioeconomic History  . Marital status: Single    Spouse name: Not on file  . Number of children: Not on file  . Years of education: Not on file  . Highest education level: Not on file  Occupational History  . Not on file  Social Needs  . Financial resource strain: Not on file  . Food insecurity:    Worry: Not on file    Inability: Not on file  . Transportation needs:    Medical: Not on file    Non-medical: Not on file  Tobacco Use  . Smoking status: Never Smoker  . Smokeless tobacco: Never Used  Substance and Sexual Activity  . Alcohol use: No  . Drug use: No  . Sexual activity: Never  Lifestyle  . Physical activity:    Days per week: Not on file    Minutes per session: Not on file  . Stress: Not on file  Relationships  . Social connections:    Talks on phone: Not on file    Gets together: Not on file    Attends religious service: Not on file    Active member of club or organization: Not on file    Attends meetings of clubs or organizations: Not on file    Relationship status: Not on file  Other Topics Concern  . Not on file  Social History Narrative  . Not on file     Family History: The patient's family history includes Diabetes in his maternal grandmother and mother; Heart disease in his mother; Hyperlipidemia in his maternal  grandfather, maternal grandmother, and mother; Hypertension in his mother.  ROS:   Review of Systems  Constitution: Negative.  HENT: Negative.   Eyes: Negative.   Cardiovascular: Positive for chest pain and dyspnea on exertion.  Respiratory: Positive for shortness of breath and wheezing.   Endocrine: Negative.   Hematologic/Lymphatic: Negative.   Skin: Negative.   Musculoskeletal: Negative.   Gastrointestinal: Negative.   Genitourinary: Negative.   Neurological: Negative.   Psychiatric/Behavioral: Negative.   Allergic/Immunologic: Negative.    Please see the history of present illness.     All other systems reviewed and are negative.  EKGs/Labs/Other Studies Reviewed:    The following studies were reviewed today:   EKG:  EKG is  ordered today.  The ekg ordered today demonstrates [facility]  normal EKG  Chest CT 11/27/16: IMPRESSION: 1. No findings to suggest interstitial lung disease. 2. No acute findings in the thorax to account for the patient's symptoms. 3. Old healed fracture of the inferior aspect of the sternum.  Recent Labs: D Dimer 0.23 A1c 8.1 10/30/2017: ALT 21; BUN 19; Creat 1.01; Hemoglobin 16.6; Platelets 359; Potassium 4.0; Sodium 141; TSH 3.92  Recent Lipid Panel    Component Value Date/Time   CHOL 149 10/30/2017 1118   TRIG 99 10/30/2017 1118   HDL 38 (L) 10/30/2017 1118   CHOLHDL 3.9 10/30/2017 1118   VLDL 39 (H) 08/05/2015 1145   LDLCALC 92 10/30/2017 1118   LDLDIRECT 82 05/18/2015 1158    Physical Exam:    VS:  BP (!) 144/88 (BP Location: Left Arm, Patient Position: Sitting, Cuff Size: Normal)   Pulse 63   Ht 5\' 11"  (1.803 m)   Wt 216 lb (98 kg)   SpO2 98%   BMI 30.13 kg/m     Wt Readings from Last 3 Encounters:  12/05/17 216 lb (98 kg)  11/22/17 212 lb (96.2 kg)  10/30/17 209 lb (94.8 kg)     GEN:  Well nourished, well developed in no acute distress HEENT: Normal NECK: No JVD; No carotid bruits LYMPHATICS: No lymphadenopathy CARDIAC:  RRR, no murmurs, rubs, gallops RESPIRATORY:  Clear to auscultation without rales, wheezing or rhonchi  ABDOMEN: Soft, non-tender, non-distended MUSCULOSKELETAL:  No edema; No deformity  SKIN: Warm and dry NEUROLOGIC:  Alert and oriented x 3 PSYCHIATRIC:  Normal affect     Signed, Norman Herrlich, MD  12/05/2017 12:48 PM    [facility]

## 2017-12-05 ENCOUNTER — Ambulatory Visit: Payer: BC Managed Care – PPO | Admitting: Cardiology

## 2017-12-05 ENCOUNTER — Encounter: Payer: Self-pay | Admitting: Cardiology

## 2017-12-05 VITALS — BP 144/88 | HR 63 | Ht 71.0 in | Wt 216.0 lb

## 2017-12-05 DIAGNOSIS — E782 Mixed hyperlipidemia: Secondary | ICD-10-CM

## 2017-12-05 DIAGNOSIS — E1165 Type 2 diabetes mellitus with hyperglycemia: Secondary | ICD-10-CM

## 2017-12-05 DIAGNOSIS — R079 Chest pain, unspecified: Secondary | ICD-10-CM

## 2017-12-05 NOTE — Patient Instructions (Signed)
Medication Instructions:  Your physician recommends that you continue on your current medications as directed. Please refer to the Current Medication list given to you today.  Labwork: None  Testing/Procedures: You had an EKG today.  Your physician has requested that you have en exercise stress myoview. For further information please visit [email or web address]. Please follow instruction sheet, as given.  Follow-Up: Your physician recommends that you schedule a follow-up appointment in: 6 weeks.  Any Other Special Instructions Will Be Listed Below (If Applicable).     If you need a refill on your cardiac medications before your next appointment, please call your pharmacy.

## 2017-12-06 ENCOUNTER — Telehealth (HOSPITAL_COMMUNITY): Payer: Self-pay | Admitting: *Deleted

## 2017-12-06 NOTE — Telephone Encounter (Signed)
Patient given detailed instructions per Myocardial Perfusion Study Information Sheet for the test on 12/11/17. Patient notified to arrive 15 minutes early and that it is imperative to arrive on time for appointment to keep from having the test rescheduled.  If you need to cancel or reschedule your appointment, please call the office within 24 hours of your appointment. . Patient verbalized understanding. Ralph Benavidez Jacqueline    

## 2017-12-09 ENCOUNTER — Other Ambulatory Visit: Payer: Self-pay | Admitting: Sports Medicine

## 2017-12-09 DIAGNOSIS — J00 Acute nasopharyngitis [common cold]: Secondary | ICD-10-CM

## 2017-12-10 ENCOUNTER — Ambulatory Visit: Payer: BC Managed Care – PPO | Admitting: Sports Medicine

## 2017-12-10 DIAGNOSIS — R079 Chest pain, unspecified: Secondary | ICD-10-CM

## 2017-12-10 DIAGNOSIS — S83207D Unspecified tear of unspecified meniscus, current injury, left knee, subsequent encounter: Secondary | ICD-10-CM | POA: Diagnosis not present

## 2017-12-10 DIAGNOSIS — L0232 Furuncle of buttock: Secondary | ICD-10-CM

## 2017-12-10 DIAGNOSIS — R942 Abnormal results of pulmonary function studies: Secondary | ICD-10-CM

## 2017-12-10 MED ORDER — DOXYCYCLINE HYCLATE 100 MG PO TABS: 100.0000 mg | ORAL_TABLET | Freq: Two times a day (BID) | ORAL | 0 refills | Status: AC

## 2017-12-10 NOTE — Assessment & Plan Note (Signed)
Left inner buttock, 7 days of doxycycline.

## 2017-12-10 NOTE — Assessment & Plan Note (Signed)
Stress test is tomorrow. If negative for an ischemic process we will probably do a Breo inhaler. 

## 2017-12-10 NOTE — Progress Notes (Signed)
Subjective:    CC: Follow-up  HPI: Knee pain: Failed steroid injections, home physical therapy, MRI showed medial and lateral meniscal tears in the left knee.  Chest pain: With some shortness of breath, initially suspected to be more deconditioning, pre-and postbronchodilator spirometry showed more restrictive pattern not improved with bronchodilator.  Labs have been normal, CT scan of the chest was normal.  He does have a strong family history of coronary artery disease, mother just had catheterization with stenting.  He has touch base with cardiology, Myoview stress test is planned.  In addition for the past few days he is noted a painful lump on his left buttock.  Not draining, no fevers or chills.  I reviewed the past medical history, family history, social history, surgical history, and allergies today and no changes were needed.  Please see the problem list section below in epic for further details.  Past Medical History: Past Medical History:  Diagnosis Date  . Acanthosis 08/24/2014  . ADHD (attention deficit hyperactivity disorder)   . Annual physical exam 04/28/2014  . Attention deficit hyperactivity disorder (ADHD) 02/04/2008   Overview:  ADHD, Combined Type  ICD-10 cut over   Last Assessment & Plan:  Relevant Hx: Course: Daily Update: Today's Plan: restart Strattera  . Callus of foot 08/07/2017  . Chest pain 10/30/2017  . Chronic myofascial pain 03/23/2017  . Chronic pain disorder 03/23/2017  . Dental crown present   . Diabetes mellitus without complication (HCC)   . Diabetes mellitus, type 2 (HCC) 04/29/2014  . Hyperlipidemia   . Hypertriglyceridemia 11/19/2012  . Insomnia 03/06/2014  . Low back pain 08/19/2015  . Migraine headache 05/19/2013  . Obesity (BMI 30-39.9) 03/28/2013  . Patellofemoral syndrome, bilateral 08/07/2017  . Pilonidal cyst 03/2013  . Pilonidal disease s/p I&D 03/10/2013 11/19/2012  . 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  . Rhinitis 08/07/2017  . 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 Thoracic spondylosis without myelopathy 03/23/2017   Past Surgical History: Past Surgical History:  Procedure Laterality Date  . NO PAST SURGERIES    . PILONIDAL CYST EXCISION N/A 03/10/2013   Procedure: CYST EXCISION PILONIDAL SIMPLE I and D ;  Surgeon: Robyne AskewPaul S Toth III, MD;  Location: [facility];  Service: General;  Laterality: N/A;   Social History: Social History   Socioeconomic History  . Marital status: Single    Spouse name: Not on file  . Number of children: Not on file  . Years of education: Not on file  . Highest education level: Not on file  Occupational History  . Not on file  Social Needs  . Financial resource strain: Not on file  . Food insecurity:    Worry: Not on file    Inability: Not on file  . Transportation needs:    Medical: Not on file    Non-medical: Not on file  Tobacco Use  . Smoking status: Never Smoker  . Smokeless tobacco: Never Used  Substance and Sexual Activity  . Alcohol use: No  . Drug use: No  . Sexual activity: Never  Lifestyle  . Physical activity:    Days per week: Not on file    Minutes per session: Not on file  . Stress: Not on file  Relationships  . Social connections:    Talks on phone: Not on file    Gets together:  Not on file    Attends religious service: Not on file    Active member of club or organization: Not on file    Attends meetings of clubs or organizations: Not on file    Relationship status: Not on file  Other Topics Concern  . Not on file  Social History Narrative  . Not on file   Family History: Family History  Problem Relation Age of Onset  . Hyperlipidemia Mother   . Heart disease Mother   . Diabetes Mother   . Hypertension Mother   . Diabetes Maternal Grandmother   . Hyperlipidemia Maternal  Grandmother   . Hyperlipidemia Maternal Grandfather    Allergies: No Known Allergies Medications: See med rec.  Review of Systems: No fevers, chills, night sweats, weight loss, chest pain, or shortness of breath.   Objective:    General: Well Developed, well nourished, and in no acute distress.  Neuro: Alert and oriented x3, extra-ocular muscles intact, sensation grossly intact.  HEENT: Normocephalic, atraumatic, pupils equal round reactive to light, neck supple, no masses, no lymphadenopathy, thyroid nonpalpable.  Skin: Warm and dry, no rashes.  Furuncle on the left buttock. Cardiac: Regular rate and rhythm, no murmurs rubs or gallops, no lower extremity edema.  Respiratory: Clear to auscultation bilaterally. Not using accessory muscles, speaking in full sentences.  Impression and Recommendations:    Acute meniscal tear of left knee Medial and lateral meniscal tears, failed injections, he does need an arthroscopy.   Chest pain Stress test is tomorrow. If negative for an ischemic process we will probably do a Breo inhaler.  Restrictive pattern present on pulmonary function testing Stress test is tomorrow. If negative for an ischemic process we will probably do a Breo inhaler.  Furuncle of buttock Left inner buttock, 7 days of doxycycline. ___________________________________________ Ihor Austin. Benjamin Stain, M.D., ABFM., CAQSM. Primary Care and Sports Medicine [facility]  Adjunct Instructor of Family Medicine  [employer]

## 2017-12-10 NOTE — Assessment & Plan Note (Signed)
Medial and lateral meniscal tears, failed injections, he does need an arthroscopy.

## 2017-12-10 NOTE — Assessment & Plan Note (Signed)
Stress test is tomorrow. If negative for an ischemic process we will probably do a Breo inhaler.

## 2017-12-11 ENCOUNTER — Ambulatory Visit (HOSPITAL_COMMUNITY): Payer: BC Managed Care – PPO | Attending: Cardiovascular Disease

## 2017-12-11 DIAGNOSIS — I2510 Atherosclerotic heart disease of native coronary artery without angina pectoris: Secondary | ICD-10-CM | POA: Diagnosis not present

## 2017-12-11 DIAGNOSIS — R0602 Shortness of breath: Secondary | ICD-10-CM | POA: Diagnosis not present

## 2017-12-11 DIAGNOSIS — E119 Type 2 diabetes mellitus without complications: Secondary | ICD-10-CM | POA: Diagnosis not present

## 2017-12-11 DIAGNOSIS — R079 Chest pain, unspecified: Secondary | ICD-10-CM | POA: Diagnosis present

## 2017-12-11 DIAGNOSIS — Z8249 Family history of ischemic heart disease and other diseases of the circulatory system: Secondary | ICD-10-CM | POA: Insufficient documentation

## 2017-12-11 LAB — MYOCARDIAL PERFUSION IMAGING
CSEPPHR: 146 {beats}/min
LVDIAVOL: 132 mL (ref 62–150)
LVSYSVOL: 60 mL
RATE: 0.39
Rest HR: 70 {beats}/min
SDS: 4
SRS: 3
SSS: 7
TID: 1.24

## 2017-12-11 MED ORDER — TECHNETIUM TC 99M TETROFOSMIN IV KIT
10.6000 | PACK | Freq: Once | INTRAVENOUS | Status: AC | PRN
  Administered 2017-12-11: 10.6 via INTRAVENOUS
  Filled 2017-12-11: qty 11

## 2017-12-11 MED ORDER — TECHNETIUM TC 99M TETROFOSMIN IV KIT
30.6000 | PACK | Freq: Once | INTRAVENOUS | Status: AC | PRN
  Administered 2017-12-11: 30.6 via INTRAVENOUS
  Filled 2017-12-11: qty 31

## 2017-12-11 MED ORDER — REGADENOSON 0.4 MG/5ML IV SOLN
0.4000 mg | Freq: Once | INTRAVENOUS | Status: AC
  Administered 2017-12-11: 0.4 mg via INTRAVENOUS

## 2017-12-12 ENCOUNTER — Telehealth: Payer: Self-pay

## 2017-12-12 NOTE — Telephone Encounter (Signed)
Left message to return call on home and mobile number  

## 2017-12-12 NOTE — Telephone Encounter (Signed)
[name], Selena BattenKim, informed of results. Selena BattenKim states that they are leaving to go to [place] tomorrow and will be gone for 2 weeks. Discussed with Dr. Dulce SellarMunley. He advised test was mildly abnormal and okay to wait until they return. Appointment made for 01/08/18 at 3:40 pm in [place]. Advised to call if any further questions or concerns arise.

## 2017-12-12 NOTE — H&P (View-Only) (Signed)
Cardiology Office Note:    Date:  12/13/2017   ID:  Derrick Carter, DOB 1990-08-18, MRN [id number]  PCP:  Monica Becton, MD  Cardiologist:  Norman Herrlich, MD    Referring MD: Monica Becton,*    ASSESSMENT:    1. Abnormal myocardial perfusion study   2. Chest pain, unspecified type   3. Type 2 diabetes mellitus without complication, without long-term current use of insulin (HCC)   4. Mixed hyperlipidemia   5. Preop cardiovascular exam    PLAN:    In order of problem 1. Will be referred for left heart catheterization possible PCI 2. See above 3. Stable managed by his PCP 4. Stable continue current treatment including statin 5. Error   Next appointment: 4 weeks   Medication Adjustments/Labs and Tests Ordered: Current medicines are reviewed at length with the patient today.  Concerns regarding medicines are outlined above.  Orders Placed This Encounter  Procedures  . Basic metabolic panel  . CBC   Meds ordered this encounter  Medications  . aspirin EC 81 MG tablet    Sig: Take 1 tablet (81 mg total) by mouth daily.    Dispense:  90 tablet    Refill:  3  . metoprolol tartrate (LOPRESSOR) 25 MG tablet    Sig: Take 1 tablet (25 mg total) by mouth 2 (two) times daily.    Dispense:  60 tablet    Refill:  11    Chief Complaint  Patient presents with  . Follow-up    History of Present Illness:    Derrick Carter is a 27 y.o. male with a hx of t2 DM, hyperlipidemia, SOB and chest pain last seen 12/05/17.  ASSESSMENT:    12/05/17   1. Chest pain, unspecified type   2. Mixed hyperlipidemia   3. Type 2 diabetes mellitus with hyperglycemia, without long-term current use of insulin (HCC)    PLAN:    1. Despite his young age is an increased cardiovascular risk with family history of diabetes hyperlipidemia and for further      evaluation undergo stress myocardial perfusion study.  If abnormal I would favor coronary angiography. 2. Stable continue  combined statin fabric acid therapy 3. Stable continue current treatment on appropriate medications for cardiovascular benefit   Stress MPI 12/11/17: Overall Study Impression Myocardial perfusion is abnormal. This is an intermediate risk study. Overall left ventricular systolic function was normal. LV cavity size is normal. Nuclear stress EF: 55%. The left ventricular ejection fraction is normal (55-65%).   There is a small defect of moderate severity present in the apical anterior and apical lateral location. The defect is reversible.   Stress Findings A pharmacological stress test was performed using IV Lexiscan 0.4mg  over 10 seconds performed with concurrent submaximal exercise (1.55mph, 0% grade).  The patient reported shortness of breath during the stress test.  Test was stopped per protocol.  Test was converted to pharmacologic due to the patient's inability to exercise.  Patient became fatigued before achieving THR. Switched to low level Lexiscan.  Recovery time: 5 minutes.    Compliance with diet, lifestyle and medications: Yes  He seen back in the office at his request following his myocardial perfusion study.  Unfortunately was done pharmacologically he was limited in the treadmill and abnormal with an ischemic perfusion defect.  I have advised him undergo coronary angiography as he is at high risk with his diabetes and dyslipidemia and symptoms of anginal equivalent shortness of breath and  chest pain.  Because of family commitments he will be scheduled in 1 week he will start daily aspirin I placed him on a beta-blocker as antianginal therapy.  The procedure left heart catheterization and PCI was discussed in detail including risk benefits and options and the patient consented. Past Medical History:  Diagnosis Date  . Acanthosis 08/24/2014  . ADHD (attention deficit hyperactivity disorder)   . Annual physical exam 04/28/2014  . Attention deficit hyperactivity disorder (ADHD)  02/04/2008   Overview:  ADHD, Combined Type  ICD-10 cut over   Last Assessment & Plan:  Relevant Hx: Course: Daily Update: Today's Plan: restart Strattera  . Callus of foot 08/07/2017  . Chest pain 10/30/2017  . Chronic myofascial pain 03/23/2017  . Chronic pain disorder 03/23/2017  . Dental crown present   . Diabetes mellitus without complication (HCC)   . Diabetes mellitus, type 2 (HCC) 04/29/2014  . Hyperlipidemia   . Hypertriglyceridemia 11/19/2012  . Insomnia 03/06/2014  . Low back pain 08/19/2015  . Migraine headache 05/19/2013  . Obesity (BMI 30-39.9) 03/28/2013  . Patellofemoral syndrome, bilateral 08/07/2017  . Pilonidal cyst 03/2013  . Pilonidal disease s/p I&D 03/10/2013 11/19/2012  . 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  . Rhinitis 08/07/2017  . 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 Thoracic spondylosis without myelopathy 03/23/2017    Past Surgical History:  Procedure Laterality Date  . NO PAST SURGERIES    . PILONIDAL CYST EXCISION N/A 03/10/2013   Procedure: CYST EXCISION PILONIDAL SIMPLE I and D ;  Surgeon: Robyne AskewPaul S Toth III, MD;  Location: [facility];  Service: General;  Laterality: N/A;    Current Medications: Current Meds  Medication Sig  . atorvastatin (LIPITOR) 40 MG tablet TAKE 1 TABLET BY MOUTH DAILY  . diphenhydrAMINE (BENADRYL) 50 MG tablet One tab 30 minutes before desired sleep time  . doxycycline (VIBRA-TABS) 100 MG tablet Take 1 tablet (100 mg total) by mouth 2 (two) times daily for 7 days.  . fenofibrate 160 MG tablet Take 1 tablet (160 mg total) by mouth daily.  Marland Kitchen. glipiZIDE (GLUCOTROL) 10 MG tablet TAKE 1 TABLET (10 MG TOTAL) BY MOUTH 2 (TWO) TIMES DAILY BEFORE A MEAL.  Marland Kitchen. loratadine (CLARITIN) 10 MG tablet TAKE 1 TABLET BY MOUTH EVERY DAY  . XIGDUO XR 04-999 MG TB24 TAKE 1 TABLET BY MOUTH  DAILY.     Allergies:   Patient has no known allergies.   Social History   Socioeconomic History  . Marital status: Single    Spouse name: Not on file  . Number of children: Not on file  . Years of education: Not on file  . Highest education level: Not on file  Occupational History  . Not on file  Social Needs  . Financial resource strain: Not on file  . Food insecurity:    Worry: Not on file    Inability: Not on file  . Transportation needs:    Medical: Not on file    Non-medical: Not on file  Tobacco Use  . Smoking status: Never Smoker  . Smokeless tobacco: Never Used  Substance and Sexual Activity  . Alcohol use: No  . Drug use: No  . Sexual activity: Never  Lifestyle  . Physical activity:    Days per week: Not on file    Minutes per session: Not on file  .  Stress: Not on file  Relationships  . Social connections:    Talks on phone: Not on file    Gets together: Not on file    Attends religious service: Not on file    Active member of club or organization: Not on file    Attends meetings of clubs or organizations: Not on file    Relationship status: Not on file  Other Topics Concern  . Not on file  Social History Narrative  . Not on file     Family History: The patient's family history includes Diabetes in his maternal grandmother and mother; Heart disease in his mother; Hyperlipidemia in his maternal grandfather, maternal grandmother, and mother; Hypertension in his mother. ROS:   Please see the history of present illness.    All other systems reviewed and are negative.  EKGs/Labs/Other Studies Reviewed:    The following studies were reviewed today:   Recent Labs: 10/30/2017: ALT 21; BUN 19; Creat 1.01; Hemoglobin 16.6; Platelets 359; Potassium 4.0; Sodium 141; TSH 3.92  Recent Lipid Panel    Component Value Date/Time   CHOL 149 10/30/2017 1118   TRIG 99 10/30/2017 1118   HDL 38 (L) 10/30/2017 1118   CHOLHDL 3.9 10/30/2017 1118   VLDL 39 (H)  08/05/2015 1145   LDLCALC 92 10/30/2017 1118   LDLDIRECT 82 05/18/2015 1158    Physical Exam:    VS:  BP 108/80 (BP Location: Right Arm, Patient Position: Sitting, Cuff Size: Normal)   Pulse 73   Ht 5\' 11"  (1.803 m)   Wt 213 lb (96.6 kg)   SpO2 98%   BMI 29.71 kg/m     Wt Readings from Last 3 Encounters:  12/13/17 213 lb (96.6 kg)  12/11/17 216 lb (98 kg)  12/05/17 216 lb (98 kg)     GEN:  Well nourished, well developed in no acute distress HEENT: Normal NECK: No JVD; No carotid bruits LYMPHATICS: No lymphadenopathy CARDIAC: Referral for left heart cath possible PCI RRR, no murmurs, rubs, gallops RESPIRATORY:  Clear to auscultation without rales, wheezing or rhonchi  ABDOMEN: Soft, non-tender, non-distended MUSCULOSKELETAL:  No edema; No deformity  SKIN: Warm and dry NEUROLOGIC:  Alert and oriented x 3 PSYCHIATRIC:  Normal affect    Signed, Norman Herrlich, MD  12/13/2017 11:36 AM     [facility]

## 2017-12-12 NOTE — Telephone Encounter (Signed)
-----   Message from Baldo DaubBrian J Munley, MD sent at 12/12/2017  8:07 AM EDT ----- Abnormal stress, can he see me tomorrow of Friday?

## 2017-12-12 NOTE — Progress Notes (Signed)
Cardiology Office Note:    Date:  12/13/2017   ID:  Derrick Carter, DOB 1990-08-18, MRN [id number]  PCP:  Monica Becton, MD  Cardiologist:  Norman Herrlich, MD    Referring MD: Monica Becton,*    ASSESSMENT:    1. Abnormal myocardial perfusion study   2. Chest pain, unspecified type   3. Type 2 diabetes mellitus without complication, without long-term current use of insulin (HCC)   4. Mixed hyperlipidemia   5. Preop cardiovascular exam    PLAN:    In order of problem 1. Will be referred for left heart catheterization possible PCI 2. See above 3. Stable managed by his PCP 4. Stable continue current treatment including statin 5. Error   Next appointment: 4 weeks   Medication Adjustments/Labs and Tests Ordered: Current medicines are reviewed at length with the patient today.  Concerns regarding medicines are outlined above.  Orders Placed This Encounter  Procedures  . Basic metabolic panel  . CBC   Meds ordered this encounter  Medications  . aspirin EC 81 MG tablet    Sig: Take 1 tablet (81 mg total) by mouth daily.    Dispense:  90 tablet    Refill:  3  . metoprolol tartrate (LOPRESSOR) 25 MG tablet    Sig: Take 1 tablet (25 mg total) by mouth 2 (two) times daily.    Dispense:  60 tablet    Refill:  11    Chief Complaint  Patient presents with  . Follow-up    History of Present Illness:    Derrick Carter is a 27 y.o. male with a hx of t2 DM, hyperlipidemia, SOB and chest pain last seen 12/05/17.  ASSESSMENT:    12/05/17   1. Chest pain, unspecified type   2. Mixed hyperlipidemia   3. Type 2 diabetes mellitus with hyperglycemia, without long-term current use of insulin (HCC)    PLAN:    1. Despite his young age is an increased cardiovascular risk with family history of diabetes hyperlipidemia and for further      evaluation undergo stress myocardial perfusion study.  If abnormal I would favor coronary angiography. 2. Stable continue  combined statin fabric acid therapy 3. Stable continue current treatment on appropriate medications for cardiovascular benefit   Stress MPI 12/11/17: Overall Study Impression Myocardial perfusion is abnormal. This is an intermediate risk study. Overall left ventricular systolic function was normal. LV cavity size is normal. Nuclear stress EF: 55%. The left ventricular ejection fraction is normal (55-65%).   There is a small defect of moderate severity present in the apical anterior and apical lateral location. The defect is reversible.   Stress Findings A pharmacological stress test was performed using IV Lexiscan 0.4mg  over 10 seconds performed with concurrent submaximal exercise (1.55mph, 0% grade).  The patient reported shortness of breath during the stress test.  Test was stopped per protocol.  Test was converted to pharmacologic due to the patient's inability to exercise.  Patient became fatigued before achieving THR. Switched to low level Lexiscan.  Recovery time: 5 minutes.    Compliance with diet, lifestyle and medications: Yes  He seen back in the office at his request following his myocardial perfusion study.  Unfortunately was done pharmacologically he was limited in the treadmill and abnormal with an ischemic perfusion defect.  I have advised him undergo coronary angiography as he is at high risk with his diabetes and dyslipidemia and symptoms of anginal equivalent shortness of breath and  chest pain.  Because of family commitments he will be scheduled in 1 week he will start daily aspirin I placed him on a beta-blocker as antianginal therapy.  The procedure left heart catheterization and PCI was discussed in detail including risk benefits and options and the patient consented. Past Medical History:  Diagnosis Date  . Acanthosis 08/24/2014  . ADHD (attention deficit hyperactivity disorder)   . Annual physical exam 04/28/2014  . Attention deficit hyperactivity disorder (ADHD)  02/04/2008   Overview:  ADHD, Combined Type  ICD-10 cut over   Last Assessment & Plan:  Relevant Hx: Course: Daily Update: Today's Plan: restart Strattera  . Callus of foot 08/07/2017  . Chest pain 10/30/2017  . Chronic myofascial pain 03/23/2017  . Chronic pain disorder 03/23/2017  . Dental crown present   . Diabetes mellitus without complication (HCC)   . Diabetes mellitus, type 2 (HCC) 04/29/2014  . Hyperlipidemia   . Hypertriglyceridemia 11/19/2012  . Insomnia 03/06/2014  . Low back pain 08/19/2015  . Migraine headache 05/19/2013  . Obesity (BMI 30-39.9) 03/28/2013  . Patellofemoral syndrome, bilateral 08/07/2017  . Pilonidal cyst 03/2013  . Pilonidal disease s/p I&D 03/10/2013 11/19/2012  . 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  . Rhinitis 08/07/2017  . 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 Thoracic spondylosis without myelopathy 03/23/2017    Past Surgical History:  Procedure Laterality Date  . NO PAST SURGERIES    . PILONIDAL CYST EXCISION N/A 03/10/2013   Procedure: CYST EXCISION PILONIDAL SIMPLE I and D ;  Surgeon: Robyne AskewPaul S Toth III, MD;  Location: [facility];  Service: General;  Laterality: N/A;    Current Medications: Current Meds  Medication Sig  . atorvastatin (LIPITOR) 40 MG tablet TAKE 1 TABLET BY MOUTH DAILY  . diphenhydrAMINE (BENADRYL) 50 MG tablet One tab 30 minutes before desired sleep time  . doxycycline (VIBRA-TABS) 100 MG tablet Take 1 tablet (100 mg total) by mouth 2 (two) times daily for 7 days.  . fenofibrate 160 MG tablet Take 1 tablet (160 mg total) by mouth daily.  Marland Kitchen. glipiZIDE (GLUCOTROL) 10 MG tablet TAKE 1 TABLET (10 MG TOTAL) BY MOUTH 2 (TWO) TIMES DAILY BEFORE A MEAL.  Marland Kitchen. loratadine (CLARITIN) 10 MG tablet TAKE 1 TABLET BY MOUTH EVERY DAY  . XIGDUO XR 04-999 MG TB24 TAKE 1 TABLET BY MOUTH  DAILY.     Allergies:   Patient has no known allergies.   Social History   Socioeconomic History  . Marital status: Single    Spouse name: Not on file  . Number of children: Not on file  . Years of education: Not on file  . Highest education level: Not on file  Occupational History  . Not on file  Social Needs  . Financial resource strain: Not on file  . Food insecurity:    Worry: Not on file    Inability: Not on file  . Transportation needs:    Medical: Not on file    Non-medical: Not on file  Tobacco Use  . Smoking status: Never Smoker  . Smokeless tobacco: Never Used  Substance and Sexual Activity  . Alcohol use: No  . Drug use: No  . Sexual activity: Never  Lifestyle  . Physical activity:    Days per week: Not on file    Minutes per session: Not on file  .  Stress: Not on file  Relationships  . Social connections:    Talks on phone: Not on file    Gets together: Not on file    Attends religious service: Not on file    Active member of club or organization: Not on file    Attends meetings of clubs or organizations: Not on file    Relationship status: Not on file  Other Topics Concern  . Not on file  Social History Narrative  . Not on file     Family History: The patient's family history includes Diabetes in his maternal grandmother and mother; Heart disease in his mother; Hyperlipidemia in his maternal grandfather, maternal grandmother, and mother; Hypertension in his mother. ROS:   Please see the history of present illness.    All other systems reviewed and are negative.  EKGs/Labs/Other Studies Reviewed:    The following studies were reviewed today:   Recent Labs: 10/30/2017: ALT 21; BUN 19; Creat 1.01; Hemoglobin 16.6; Platelets 359; Potassium 4.0; Sodium 141; TSH 3.92  Recent Lipid Panel    Component Value Date/Time   CHOL 149 10/30/2017 1118   TRIG 99 10/30/2017 1118   HDL 38 (L) 10/30/2017 1118   CHOLHDL 3.9 10/30/2017 1118   VLDL 39 (H)  08/05/2015 1145   LDLCALC 92 10/30/2017 1118   LDLDIRECT 82 05/18/2015 1158    Physical Exam:    VS:  BP 108/80 (BP Location: Right Arm, Patient Position: Sitting, Cuff Size: Normal)   Pulse 73   Ht 5\' 11"  (1.803 m)   Wt 213 lb (96.6 kg)   SpO2 98%   BMI 29.71 kg/m     Wt Readings from Last 3 Encounters:  12/13/17 213 lb (96.6 kg)  12/11/17 216 lb (98 kg)  12/05/17 216 lb (98 kg)     GEN:  Well nourished, well developed in no acute distress HEENT: Normal NECK: No JVD; No carotid bruits LYMPHATICS: No lymphadenopathy CARDIAC: Referral for left heart cath possible PCI RRR, no murmurs, rubs, gallops RESPIRATORY:  Clear to auscultation without rales, wheezing or rhonchi  ABDOMEN: Soft, non-tender, non-distended MUSCULOSKELETAL:  No edema; No deformity  SKIN: Warm and dry NEUROLOGIC:  Alert and oriented x 3 PSYCHIATRIC:  Normal affect    Signed, Norman Herrlich, MD  12/13/2017 11:36 AM    [facility]

## 2017-12-13 ENCOUNTER — Encounter: Payer: Self-pay | Admitting: Cardiology

## 2017-12-13 ENCOUNTER — Ambulatory Visit: Payer: BC Managed Care – PPO | Admitting: Cardiology

## 2017-12-13 VITALS — BP 108/80 | HR 73 | Ht 71.0 in | Wt 213.0 lb

## 2017-12-13 DIAGNOSIS — R079 Chest pain, unspecified: Secondary | ICD-10-CM | POA: Diagnosis not present

## 2017-12-13 DIAGNOSIS — E782 Mixed hyperlipidemia: Secondary | ICD-10-CM

## 2017-12-13 DIAGNOSIS — E119 Type 2 diabetes mellitus without complications: Secondary | ICD-10-CM

## 2017-12-13 DIAGNOSIS — Z01810 Encounter for preprocedural cardiovascular examination: Secondary | ICD-10-CM | POA: Diagnosis not present

## 2017-12-13 DIAGNOSIS — R9439 Abnormal result of other cardiovascular function study: Secondary | ICD-10-CM

## 2017-12-13 MED ORDER — METOPROLOL TARTRATE 25 MG PO TABS: 25.0000 mg | ORAL_TABLET | Freq: Two times a day (BID) | ORAL | 11 refills | Status: DC

## 2017-12-13 MED ORDER — ASPIRIN EC 81 MG PO TBEC: 81.0000 mg | DELAYED_RELEASE_TABLET | Freq: Every day | ORAL | 3 refills | Status: DC

## 2017-12-13 NOTE — Patient Instructions (Addendum)
Medication Instructions:  Your physician has recommended you make the following change in your medication:  START aspirin enteric coated 81 mg daily START metoprolol tartrate (Lopressor) 25 mg twice daily  Labwork: Your physician recommends that you have the following labs drawn: BMP, CBC  Testing/Procedures: Your physician has requested that you have a cardiac catheterization. Cardiac catheterization is used to diagnose and/or treat various heart conditions. Doctors may recommend this procedure for a number of different reasons. The most common reason is to evaluate chest pain. Chest pain can be a symptom of coronary artery disease (CAD), and cardiac catheterization can show whether plaque is narrowing or blocking your heart's arteries. This procedure is also used to evaluate the valves, as well as measure the blood flow and oxygen levels in different parts of your heart. For further information please visit [email or web address]. Please follow instruction sheet, as given.    [facility] [address], [address] [place] [address] Dept: [phone] Loc: [phone]  Holley Raringnthony R Polzin  12/13/2017  You are scheduled for a Cardiac Catheterization on Monday, June 24 with Dr. Cristal Deerhristopher End.  1. Please arrive [facility] at [facility]: [address] [place], [place] [address] at 8:00 AM (two hours before your procedure to ensure your preparation). Free valet parking service is available.   Special note: Every effort is made to have your procedure done on time. Please understand that emergencies sometimes delay scheduled procedures.  2. Diet: Do not eat or drink anything after midnight prior to your procedure except sips of water to take medications.  3. Labs: We are checking today.  4. Medication instructions in preparation for your procedure:  Stop taking, glipizide and  Xiguo on Monday, June 24.    On the morning of your procedure, take your Aspirin and any morning medicines NOT listed above.  You may use sips of water.  5. Plan for one night stay--bring personal belongings. 6. Bring a current list of your medications and current insurance cards. 7. You MUST have a responsible person to drive you home. 8. Someone MUST be with you the first 24 hours after you arrive home or your discharge will be delayed. 9. Please wear clothes that are easy to get on and off and wear slip-on shoes.  Thank you for allowing us to care for you!   -- [facility]  Follow-Up: Your physician recommends that you schedule a follow-up appointment in: 4 weeks.  Any Other Special Instructions Will Be Listed Below (If Applicable).     If you need a refill on your cardiac medications before your next appointment, please call your pharmacy.

## 2017-12-14 LAB — CBC
HEMATOCRIT: 48.7 % (ref 37.5–51.0)
HEMOGLOBIN: 15.9 g/dL (ref 13.0–17.7)
MCH: 30.5 pg (ref 26.6–33.0)
MCHC: 32.6 g/dL (ref 31.5–35.7)
MCV: 93 fL (ref 79–97)
Platelets: 322 10*3/uL (ref 150–450)
RBC: 5.22 x10E6/uL (ref 4.14–5.80)
RDW: 12.8 % (ref 12.3–15.4)
WBC: 8.5 10*3/uL (ref 3.4–10.8)

## 2017-12-14 LAB — BASIC METABOLIC PANEL
BUN/Creatinine Ratio: 17 (ref 9–20)
BUN: 17 mg/dL (ref 6–20)
CALCIUM: 9.6 mg/dL (ref 8.7–10.2)
CO2: 22 mmol/L (ref 20–29)
Chloride: 104 mmol/L (ref 96–106)
Creatinine, Ser: 1.01 mg/dL (ref 0.76–1.27)
GFR calc Af Amer: 119 mL/min/{1.73_m2} (ref >59)
GFR, EST NON AFRICAN AMERICAN: 103 mL/min/{1.73_m2} (ref >59)
Glucose: 129 mg/dL — ABNORMAL HIGH (ref 65–99)
Potassium: 4.5 mmol/L (ref 3.5–5.2)
Sodium: 143 mmol/L (ref 134–144)

## 2017-12-18 ENCOUNTER — Other Ambulatory Visit: Payer: Self-pay | Admitting: Sports Medicine

## 2017-12-18 DIAGNOSIS — E781 Pure hyperglyceridemia: Secondary | ICD-10-CM

## 2017-12-20 ENCOUNTER — Telehealth: Payer: Self-pay | Admitting: *Deleted

## 2017-12-20 NOTE — Telephone Encounter (Signed)
Pt contacted [facility]terization [facility] for: Monday December 24, 2017 10:30 AM Verified arrival time and place: [facility] Main Entrance A at: 8AM  No solid food after midnight prior to cath, clear liquids until 5 AM day of procedure. Verified allergies in Epic  Hold: Glipizide AM of procedure Xigduo AM of procedure and 48 hours post procedure.  AM meds can be  taken pre-cath with sip of water including: ASA 81 mg  Confirmed patient has responsible person to drive home post procedure and observe patient for 24 hours: yes

## 2017-12-24 ENCOUNTER — Encounter (HOSPITAL_COMMUNITY): Payer: Self-pay | Admitting: *Deleted

## 2017-12-24 ENCOUNTER — Encounter (HOSPITAL_COMMUNITY)
Admission: RE | Disposition: A | Discharge status: Home / Self Care | Payer: Self-pay | Source: Ambulatory Visit | Attending: Internal Medicine

## 2017-12-24 ENCOUNTER — Telehealth: Payer: Self-pay | Admitting: Cardiology

## 2017-12-24 ENCOUNTER — Ambulatory Visit (HOSPITAL_COMMUNITY)
Admission: RE | Admit: 2017-12-24 | Discharge: 2017-12-24 | Disposition: A | Discharge status: Home / Self Care | Payer: BC Managed Care – PPO | Source: Ambulatory Visit | Attending: Internal Medicine | Admitting: Internal Medicine

## 2017-12-24 DIAGNOSIS — R9439 Abnormal result of other cardiovascular function study: Secondary | ICD-10-CM

## 2017-12-24 DIAGNOSIS — G43909 Migraine, unspecified, not intractable, without status migrainosus: Secondary | ICD-10-CM | POA: Insufficient documentation

## 2017-12-24 DIAGNOSIS — E669 Obesity, unspecified: Secondary | ICD-10-CM | POA: Insufficient documentation

## 2017-12-24 DIAGNOSIS — R0602 Shortness of breath: Secondary | ICD-10-CM | POA: Diagnosis not present

## 2017-12-24 DIAGNOSIS — Z6829 Body mass index (BMI) 29.0-29.9, adult: Secondary | ICD-10-CM | POA: Diagnosis not present

## 2017-12-24 DIAGNOSIS — R079 Chest pain, unspecified: Secondary | ICD-10-CM | POA: Diagnosis not present

## 2017-12-24 DIAGNOSIS — M7918 Myalgia, other site: Secondary | ICD-10-CM | POA: Insufficient documentation

## 2017-12-24 DIAGNOSIS — G8929 Other chronic pain: Secondary | ICD-10-CM | POA: Insufficient documentation

## 2017-12-24 DIAGNOSIS — E1165 Type 2 diabetes mellitus with hyperglycemia: Secondary | ICD-10-CM | POA: Diagnosis not present

## 2017-12-24 DIAGNOSIS — F909 Attention-deficit hyperactivity disorder, unspecified type: Secondary | ICD-10-CM | POA: Insufficient documentation

## 2017-12-24 DIAGNOSIS — G4700 Insomnia, unspecified: Secondary | ICD-10-CM | POA: Diagnosis not present

## 2017-12-24 DIAGNOSIS — Z7984 Long term (current) use of oral hypoglycemic drugs: Secondary | ICD-10-CM | POA: Diagnosis not present

## 2017-12-24 DIAGNOSIS — Z8249 Family history of ischemic heart disease and other diseases of the circulatory system: Secondary | ICD-10-CM | POA: Insufficient documentation

## 2017-12-24 DIAGNOSIS — E782 Mixed hyperlipidemia: Secondary | ICD-10-CM | POA: Diagnosis not present

## 2017-12-24 DIAGNOSIS — Z01810 Encounter for preprocedural cardiovascular examination: Secondary | ICD-10-CM

## 2017-12-24 HISTORY — PX: LEFT HEART CATH AND CORONARY ANGIOGRAPHY: CATH118249

## 2017-12-24 LAB — GLUCOSE, CAPILLARY: Glucose-Capillary: 124 mg/dL — ABNORMAL HIGH (ref 65–99)

## 2017-12-24 SURGERY — LEFT HEART CATH AND CORONARY ANGIOGRAPHY
Anesthesia: LOCAL

## 2017-12-24 MED ORDER — SODIUM CHLORIDE 0.9% FLUSH: 3.0000 mL | Freq: Two times a day (BID) | INTRAVENOUS | Status: DC

## 2017-12-24 MED ORDER — SODIUM CHLORIDE 0.9 % IV SOLN: 250.0000 mL | INTRAVENOUS | Status: DC | PRN

## 2017-12-24 MED ORDER — SODIUM CHLORIDE 0.9% FLUSH: 3.0000 mL | INTRAVENOUS | Status: DC | PRN

## 2017-12-24 MED ORDER — ASPIRIN 81 MG PO CHEW: 81.0000 mg | CHEWABLE_TABLET | ORAL | Status: DC

## 2017-12-24 MED ORDER — HEPARIN (PORCINE) IN NACL 1000-0.9 UT/500ML-% IV SOLN
INTRAVENOUS | Status: AC
  Filled 2017-12-24: qty 1000

## 2017-12-24 MED ORDER — FUROSEMIDE 20 MG PO TABS: 20.0000 mg | ORAL_TABLET | Freq: Every day | ORAL | 5 refills | Status: DC

## 2017-12-24 MED ORDER — IOPAMIDOL (ISOVUE-370) INJECTION 76%
INTRAVENOUS | Status: DC | PRN
  Administered 2017-12-24: 40 mL via INTRA_ARTERIAL

## 2017-12-24 MED ORDER — LIDOCAINE HCL (PF) 1 % IJ SOLN
INTRAMUSCULAR | Status: AC
  Filled 2017-12-24: qty 30

## 2017-12-24 MED ORDER — VERAPAMIL HCL 2.5 MG/ML IV SOLN
INTRAVENOUS | Status: AC
  Filled 2017-12-24: qty 2

## 2017-12-24 MED ORDER — SODIUM CHLORIDE 0.9 % WEIGHT BASED INFUSION: 1.0000 mL/kg/h | INTRAVENOUS | Status: DC

## 2017-12-24 MED ORDER — SODIUM CHLORIDE 0.9 % IV SOLN: INTRAVENOUS | Status: DC

## 2017-12-24 MED ORDER — FENTANYL CITRATE (PF) 100 MCG/2ML IJ SOLN
INTRAMUSCULAR | Status: DC | PRN
  Administered 2017-12-24: 25 ug via INTRAVENOUS

## 2017-12-24 MED ORDER — MIDAZOLAM HCL 2 MG/2ML IJ SOLN
INTRAMUSCULAR | Status: AC
  Filled 2017-12-24: qty 2

## 2017-12-24 MED ORDER — HEPARIN SODIUM (PORCINE) 1000 UNIT/ML IJ SOLN
INTRAMUSCULAR | Status: AC
  Filled 2017-12-24: qty 1

## 2017-12-24 MED ORDER — VERAPAMIL HCL 2.5 MG/ML IV SOLN
INTRAVENOUS | Status: DC | PRN
  Administered 2017-12-24: 10 mL via INTRA_ARTERIAL

## 2017-12-24 MED ORDER — HEPARIN SODIUM (PORCINE) 1000 UNIT/ML IJ SOLN
INTRAMUSCULAR | Status: DC | PRN
  Administered 2017-12-24: 5000 [IU] via INTRAVENOUS

## 2017-12-24 MED ORDER — LIDOCAINE HCL (PF) 1 % IJ SOLN
INTRAMUSCULAR | Status: DC | PRN
  Administered 2017-12-24: 1 mL

## 2017-12-24 MED ORDER — IOPAMIDOL (ISOVUE-370) INJECTION 76%
INTRAVENOUS | Status: AC
  Filled 2017-12-24: qty 100

## 2017-12-24 MED ORDER — ONDANSETRON HCL 4 MG/2ML IJ SOLN: 4.0000 mg | Freq: Four times a day (QID) | INTRAMUSCULAR | Status: DC | PRN

## 2017-12-24 MED ORDER — HEPARIN (PORCINE) IN NACL 2-0.9 UNITS/ML
INTRAMUSCULAR | Status: AC | PRN
  Administered 2017-12-24: 1000 mL

## 2017-12-24 MED ORDER — SODIUM CHLORIDE 0.9 % WEIGHT BASED INFUSION
3.0000 mL/kg/h | INTRAVENOUS | Status: DC
  Administered 2017-12-24: 3 mL/kg/h via INTRAVENOUS

## 2017-12-24 MED ORDER — MIDAZOLAM HCL 2 MG/2ML IJ SOLN
INTRAMUSCULAR | Status: DC | PRN
  Administered 2017-12-24: 1 mg via INTRAVENOUS

## 2017-12-24 MED ORDER — FENTANYL CITRATE (PF) 100 MCG/2ML IJ SOLN
INTRAMUSCULAR | Status: AC
  Filled 2017-12-24: qty 2

## 2017-12-24 MED ORDER — ACETAMINOPHEN 325 MG PO TABS: 650.0000 mg | ORAL_TABLET | ORAL | Status: DC | PRN

## 2017-12-24 SURGICAL SUPPLY — 9 items
CATH 5FR JL3.5 JR4 ANG PIG MP (CATHETERS) ×2 IMPLANT
DEVICE RAD COMP TR BAND LRG (VASCULAR PRODUCTS) ×2 IMPLANT
GLIDESHEATH SLEND SS 6F .021 (SHEATH) ×2 IMPLANT
GUIDEWIRE INQWIRE 1.5J.035X260 (WIRE) ×1 IMPLANT
INQWIRE 1.5J .035X260CM (WIRE) ×2
KIT HEART LEFT (KITS) ×2 IMPLANT
PACK CARDIAC CATHETERIZATION (CUSTOM PROCEDURE TRAY) ×2 IMPLANT
TRANSDUCER W/STOPCOCK (MISCELLANEOUS) ×2 IMPLANT
TUBING CIL FLEX 10 FLL-RA (TUBING) ×2 IMPLANT

## 2017-12-24 NOTE — Telephone Encounter (Signed)
Pt had cath today and was told he has a "stiff heart" and water around it

## 2017-12-24 NOTE — Brief Op Note (Signed)
BRIEF CARDIAC CATHETERIZATION NOTE  DATE: 12/24/2017 TIME: 9:52 AM  PATIENT:  Derrick Carter  27 y.o. male  PRE-OPERATIVE DIAGNOSIS:  abnormal stress test, shortness of breath  POST-OPERATIVE DIAGNOSIS:  Diastolic dysfunction  PROCEDURE:  Procedure(s): LEFT HEART CATH AND CORONARY ANGIOGRAPHY (N/A)  SURGEON:  Surgeon(s) and Role:    Yvonne Kendall* Alexina Niccoli, MD - Primary  FINDINGS: 1.  No CAD. 2.  Normal LVEF with mildly elevated LVEDP.  RECOMMENDATIONS: 1. Primary prevention of CAD. 2. Initiate furosemide 20 mg PO daily for diastolic dysfunction.  Plan for repeat BMP with Dr. Dulce SellarMunley in ~1 week.  [name], MD [facility] Pager: [phone]

## 2017-12-24 NOTE — Interval H&P Note (Signed)
History and Physical Interval Note:  12/24/2017 9:18 AM  Derrick Carter  has presented today for cardiac catheterization, with the diagnosis of shortness of breath and abnormal stress test  The various methods of treatment have been discussed with the patient and family. After consideration of risks, benefits and other options for treatment, the patient has consented to  Procedure(s): LEFT HEART CATH AND CORONARY ANGIOGRAPHY (N/A) as a surgical intervention .  The patient's history has been reviewed, patient examined, no change in status, stable for surgery.  I have reviewed the patient's chart and labs.  Questions were answered to the patient's satisfaction.    Cath Lab Visit (complete for each Cath Lab visit)  Clinical Evaluation Leading to the Procedure:   ACS: No.  Non-ACS:    Anginal Classification: CCS III  Anti-ischemic medical therapy: Minimal Therapy (1 class of medications)  Non-Invasive Test Results: Intermediate-risk stress test findings: cardiac mortality 1-3%/year  Prior CABG: No previous CABG  Derrick Carter

## 2017-12-24 NOTE — Telephone Encounter (Signed)
Appointment made for 1 week post cardiac catheterization per Dr. Serita KyleEnd's note for BMP and initiation of furosemide. Scheduled for 01/02/18 at 10 am. Address and phone number given for [place] location. Kim verbalized understanding, no further questions.

## 2017-12-24 NOTE — Discharge Instructions (Signed)
**Note -identified via Obfuscation** Radial Site Care °Refer to this sheet in the next few weeks. These instructions provide you with information about caring for yourself after your procedure. Your health care provider may also give you more specific instructions. Your treatment has been planned according to current medical practices, but problems sometimes occur. Call your health care provider if you have any problems or questions after your procedure. °What can I expect after the procedure? °After your procedure, it is typical to have the following: °· Bruising at the radial site that usually fades within 1-2 weeks. °· Blood collecting in the tissue (hematoma) that may be painful to the touch. It should usually decrease in size and tenderness within 1-2 weeks. ° °Follow these instructions at home: °· Take medicines only as directed by your health care provider. °· You may shower 24-48 hours after the procedure or as directed by your health care provider. Remove the bandage (dressing) and gently wash the site with plain soap and water. Pat the area dry with a clean towel. Do not rub the site, because this may cause bleeding. °· Do not take baths, swim, or use a hot tub until your health care provider approves. °· Check your insertion site every day for redness, swelling, or drainage. °· Do not apply powder or lotion to the site. °· Do not flex or bend the affected arm for 24 hours or as directed by your health care provider. °· Do not push or pull heavy objects with the affected arm for 24 hours or as directed by your health care provider. °· Do not lift over 10 lb (4.5 kg) for 5 days after your procedure or as directed by your health care provider. °· Ask your health care provider when it is okay to: °? Return to work or school. °? Resume usual physical activities or sports. °? Resume sexual activity. °· Do not drive home if you are discharged the same day as the procedure. Have someone else drive you. °· You may drive 24 hours after the procedure  unless otherwise instructed by your health care provider. °· Do not operate machinery or power tools for 24 hours after the procedure. °· If your procedure was done as an outpatient procedure, which means that you went home the same day as your procedure, a responsible adult should be with you for the first 24 hours after you arrive home. °· Keep all follow-up visits as directed by your health care provider. This is important. °Contact a health care provider if: °· You have a fever. °· You have chills. °· You have increased bleeding from the radial site. Hold pressure on the site. °Get help right away if: °· You have unusual pain at the radial site. °· You have redness, warmth, or swelling at the radial site. °· You have drainage (other than a small amount of blood on the dressing) from the radial site. °· The radial site is bleeding, and the bleeding does not stop after 30 minutes of holding steady pressure on the site. °· Your arm or hand becomes pale, cool, tingly, or numb. °This information is not intended to replace advice given to you by your health care provider. Make sure you discuss any questions you have with your health care provider. °Document Released: 07/22/2010 Document Revised: 11/25/2015 Document Reviewed: 01/05/2014 °Elsevier Interactive Patient Education © 2018 Elsevier Inc. ° °

## 2017-12-31 NOTE — Progress Notes (Signed)
Cardiology Office Note:    Date:  01/02/2018   ID:  Derrick [facility], DOB 06/21/1991, MRN [id number]  PCP:  Monica Bectonhekkekandam, Thomas J, MD  Cardiologist:  Norman HerrlichBrian Egan Sahlin, MD    Referring MD: Monica Bectonhekkekandam, Thomas J,*    ASSESSMENT:    1. Shortness of breath   2. Diastolic dysfunction   3. Type 2 diabetes mellitus without complication, without long-term current use of insulin (HCC)    PLAN:    In order of problems listed above:  1. Further evaluation PFTs diffusion capacity bronchodilator and echocardiogram for diastolic function and pulmonary pressure 2. See above 3. Stable continue current treatment he is SGLT2 and metformin is appropriate   Next appointment: One month yes   Medication Adjustments/Labs and Tests Ordered: Current medicines are reviewed at length with the patient today.  Concerns regarding medicines are outlined above.  No orders of the defined types were placed in this encounter.  No orders of the defined types were placed in this encounter.   Chief Complaint  Patient presents with  . Chest Pain  . Shortness of Breath    History of Present Illness:    Derrick Raringnthony R Carter is a 27 y.o. male with a hx of T2 DM, hyperlipidemia, chest pain and abnormal MPI  last seen 12/13/17. Left heart cath 12/24/17 showed normal coronary angiography and elevated LVEDP 20-25 mm Hg and started on a low dose loop diuretic.  Compliance with diet, lifestyle and medications:: yes  Is seen in the office following his cardiac catheterization he was placed on a diuretic symptomatically unchanged although his EDP was elevated I do not think he has diastolic heart failure.  He does think he wheezes at times he has cough and will undergo full PFTs with bronchodilator diffusion capacity and echocardiogram to assess diastolic function diastolic filling pressures.  In the interim we will stop his loop diuretic  Currently take this to Past Medical History:  Diagnosis Date  . Acanthosis 08/24/2014    . ADHD (attention deficit hyperactivity disorder)   . Annual physical exam 04/28/2014  . Attention deficit hyperactivity disorder (ADHD) 02/04/2008   Overview:  ADHD, Combined Type  ICD-10 cut over   Last Assessment & Plan:  Relevant Hx: Course: Daily Update: Today's Plan: restart Strattera  . Callus of foot 08/07/2017  . Chest pain 10/30/2017  . Chronic myofascial pain 03/23/2017  . Chronic pain disorder 03/23/2017  . Dental crown present   . Diabetes mellitus without complication (HCC)   . Diabetes mellitus, type 2 (HCC) 04/29/2014  . Hyperlipidemia   . Hypertriglyceridemia 11/19/2012  . Insomnia 03/06/2014  . Low back pain 08/19/2015  . Migraine headache 05/19/2013  . Obesity (BMI 30-39.9) 03/28/2013  . Patellofemoral syndrome, bilateral 08/07/2017  . Pilonidal cyst 03/2013  . Pilonidal disease s/p I&D 03/10/2013 11/19/2012  . 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  . Rhinitis 08/07/2017  . 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 Thoracic spondylosis without myelopathy 03/23/2017    Past Surgical History:  Procedure Laterality Date  . LEFT HEART CATH AND CORONARY ANGIOGRAPHY N/A 12/24/2017   Procedure: LEFT HEART CATH AND CORONARY ANGIOGRAPHY;  Surgeon: Yvonne KendallEnd, Christopher, MD;  Location: [facility];  Service: Cardiovascular;  Laterality: N/A;  . NO PAST SURGERIES    . PILONIDAL CYST EXCISION N/A 03/10/2013   Procedure: CYST EXCISION PILONIDAL SIMPLE I and D ;  Surgeon: Robyne Askew, MD;  Location:  SURGERY CENTER;  Service: General;  Laterality: N/A;    Current Medications: Current Meds  Medication Sig  . atorvastatin (LIPITOR) 40 MG tablet TAKE 1 TABLET BY MOUTH DAILY  . diphenhydrAMINE (BENADRYL) 50 MG tablet One tab 30 minutes before desired sleep time  . fenofibrate 160 MG tablet Take 1 tablet (160 mg total) by  mouth daily.  Marland Kitchen glipiZIDE (GLUCOTROL) 10 MG tablet TAKE 1 TABLET (10 MG TOTAL) BY MOUTH 2 (TWO) TIMES DAILY BEFORE A MEAL. (Patient taking differently: Take 10 mg by mouth daily. )  . loratadine (CLARITIN) 10 MG tablet TAKE 1 TABLET BY MOUTH EVERY DAY (Patient taking differently: TAKE 1 TABLET BY MOUTH DAILY AS NEED FOR ALLERGIES)  . metoprolol tartrate (LOPRESSOR) 25 MG tablet Take 1 tablet (25 mg total) by mouth 2 (two) times daily. (Patient taking differently: Take 25 mg by mouth daily. )  . XIGDUO XR 04-999 MG TB24 TAKE 1 TABLET BY MOUTH DAILY.  . [DISCONTINUED] furosemide (LASIX) 20 MG tablet Take 1 tablet (20 mg total) by mouth daily.     Allergies:   Patient has no known allergies.   Social History   Socioeconomic History  . Marital status: Single    Spouse name: Not on file  . Number of children: Not on file  . Years of education: Not on file  . Highest education level: Not on file  Occupational History  . Not on file  Social Needs  . Financial resource strain: Not on file  . Food insecurity:    Worry: Not on file    Inability: Not on file  . Transportation needs:    Medical: Not on file    Non-medical: Not on file  Tobacco Use  . Smoking status: Never Smoker  . Smokeless tobacco: Never Used  Substance and Sexual Activity  . Alcohol use: No  . Drug use: No  . Sexual activity: Never  Lifestyle  . Physical activity:    Days per week: Not on file    Minutes per session: Not on file  . Stress: Not on file  Relationships  . Social connections:    Talks on phone: Not on file    Gets together: Not on file    Attends religious service: Not on file    Active member of club or organization: Not on file    Attends meetings of clubs or organizations: Not on file    Relationship status: Not on file  Other Topics Concern  . Not on file  Social History Narrative  . Not on file     Family History: The patient's family history includes Diabetes in his maternal  grandmother and mother; Heart disease in his mother; Hyperlipidemia in his maternal grandfather, maternal grandmother, and mother; Hypertension in his mother. ROS:   Please see the history of present illness.    All other systems reviewed and are negative.  EKGs/Labs/Other Studies Reviewed:    The following studies were reviewed today:   Recent Labs: 10/30/2017: ALT 21; TSH 3.92 12/13/2017: BUN 17; Creatinine, Ser 1.01; Hemoglobin 15.9; Platelets 322; Potassium 4.5; Sodium 143  Recent Lipid Panel    Component Value Date/Time   CHOL 149 10/30/2017 1118   TRIG 99 10/30/2017 1118   HDL 38 (L) 10/30/2017 1118   CHOLHDL 3.9 10/30/2017 1118   VLDL 39 (H) 08/05/2015 1145   LDLCALC 92 10/30/2017 1118   LDLDIRECT 82 05/18/2015 1158  Physical Exam:    VS:  BP 110/86 (BP Location: Left Arm, Patient Position: Sitting, Cuff Size: Normal)   Pulse (!) 53   Ht 5\' 11"  (1.803 m)   Wt 217 lb (98.4 kg)   SpO2 97%   BMI 30.27 kg/m     Wt Readings from Last 3 Encounters:  01/02/18 217 lb (98.4 kg)  12/24/17 213 lb (96.6 kg)  12/13/17 213 lb (96.6 kg)     GEN:  Well nourished, well developed in no acute distress HEENT: Normal NECK: No JVD; No carotid bruits LYMPHATICS: No lymphadenopathy CARDIAC: RRR, no murmurs, rubs, gallops RESPIRATORY:  Clear to auscultation without rales, wheezing or rhonchi  ABDOMEN: Soft, non-tender, non-distended MUSCULOSKELETAL:  No edema; No deformity  SKIN: Warm and dry NEUROLOGIC:  Alert and oriented x 3 PSYCHIATRIC:  Normal affect    Signed, Norman Herrlich, MD  01/02/2018 10:43 AM     [facility]

## 2018-01-02 ENCOUNTER — Encounter: Payer: Self-pay | Admitting: Cardiology

## 2018-01-02 ENCOUNTER — Ambulatory Visit: Payer: BC Managed Care – PPO | Admitting: Cardiology

## 2018-01-02 VITALS — BP 110/86 | HR 53 | Ht 71.0 in | Wt 217.0 lb

## 2018-01-02 DIAGNOSIS — I5189 Other ill-defined heart diseases: Secondary | ICD-10-CM | POA: Diagnosis not present

## 2018-01-02 DIAGNOSIS — E119 Type 2 diabetes mellitus without complications: Secondary | ICD-10-CM | POA: Diagnosis not present

## 2018-01-02 DIAGNOSIS — R0602 Shortness of breath: Secondary | ICD-10-CM

## 2018-01-02 LAB — BASIC METABOLIC PANEL
BUN/Creatinine Ratio: 20 (ref 9–20)
BUN: 19 mg/dL (ref 6–20)
CALCIUM: 9.8 mg/dL (ref 8.7–10.2)
CHLORIDE: 99 mmol/L (ref 96–106)
CO2: 24 mmol/L (ref 20–29)
Creatinine, Ser: 0.97 mg/dL (ref 0.76–1.27)
GFR calc non Af Amer: 108 mL/min/{1.73_m2} (ref >59)
GFR, EST AFRICAN AMERICAN: 125 mL/min/{1.73_m2} (ref >59)
Glucose: 100 mg/dL — ABNORMAL HIGH (ref 65–99)
Potassium: 4 mmol/L (ref 3.5–5.2)
Sodium: 139 mmol/L (ref 134–144)

## 2018-01-02 LAB — PRO B NATRIURETIC PEPTIDE: NT-PRO BNP: 25 pg/mL (ref 0–86)

## 2018-01-02 NOTE — Patient Instructions (Signed)
Medication Instructions:  Your physician recommends that you continue on your current medications as directed. Please refer to the Current Medication list given to you today.   Labwork: Your physician recommends that you have the following labs drawn: BMP and BNP   Testing/Procedures: Your physician has requested that you have an echocardiogram. Echocardiography is a painless test that uses sound waves to create images of your heart. It provides your doctor with information about the size and shape of your heart and how well your heart's chambers and valves are working. This procedure takes approximately one hour. There are no restrictions for this procedure.  Your physician has recommended that you have a pulmonary function test. Pulmonary Function Tests are a group of tests that measure how well air moves in and out of your lungs.  You will be scheduled with Pulmonary for Pulmonary Function Test.  If their office does not contact you within a week, please contact our office.   Follow-Up: Your physician recommends that you schedule a follow-up appointment in: 4 weeks.   Any Other Special Instructions Will Be Listed Below (If Applicable).     If you need a refill on your cardiac medications before your next appointment, please call your pharmacy.

## 2018-01-08 ENCOUNTER — Ambulatory Visit: Payer: BC Managed Care – PPO | Admitting: Cardiology

## 2018-01-24 ENCOUNTER — Ambulatory Visit (HOSPITAL_BASED_OUTPATIENT_CLINIC_OR_DEPARTMENT_OTHER)
Admission: RE | Admit: 2018-01-24 | Discharge: 2018-01-24 | Disposition: A | Discharge status: Home / Self Care | Payer: BC Managed Care – PPO | Source: Ambulatory Visit | Attending: Cardiology | Admitting: Cardiology

## 2018-01-24 DIAGNOSIS — I10 Essential (primary) hypertension: Secondary | ICD-10-CM | POA: Diagnosis not present

## 2018-01-24 DIAGNOSIS — E119 Type 2 diabetes mellitus without complications: Secondary | ICD-10-CM | POA: Insufficient documentation

## 2018-01-24 DIAGNOSIS — R0602 Shortness of breath: Secondary | ICD-10-CM

## 2018-01-24 NOTE — Progress Notes (Signed)
  Echocardiogram 2D Echocardiogram has been performed.  Derrick Carter Derrick Carter Derrick Carter 01/24/2018, 1:24 PM

## 2018-01-29 ENCOUNTER — Other Ambulatory Visit: Payer: Self-pay | Admitting: Sports Medicine

## 2018-01-29 ENCOUNTER — Telehealth: Payer: Self-pay | Admitting: Cardiology

## 2018-01-29 DIAGNOSIS — E119 Type 2 diabetes mellitus without complications: Secondary | ICD-10-CM

## 2018-01-29 NOTE — Telephone Encounter (Signed)
Please call patient's mother with her son's results.

## 2018-01-29 NOTE — Telephone Encounter (Signed)
[name] Selena BattenKim informed of results, per DPR.

## 2018-01-30 ENCOUNTER — Ambulatory Visit: Payer: BC Managed Care – PPO | Admitting: Cardiology

## 2018-02-05 ENCOUNTER — Ambulatory Visit (INDEPENDENT_AMBULATORY_CARE_PROVIDER_SITE_OTHER): Payer: BC Managed Care – PPO | Admitting: Pulmonary Disease

## 2018-02-05 ENCOUNTER — Ambulatory Visit: Payer: BC Managed Care – PPO | Admitting: Pulmonary Disease

## 2018-02-05 ENCOUNTER — Encounter: Payer: Self-pay | Admitting: Pulmonary Disease

## 2018-02-05 VITALS — BP 130/70 | HR 78 | Ht 71.0 in | Wt 217.1 lb

## 2018-02-05 DIAGNOSIS — R683 Clubbing of fingers: Secondary | ICD-10-CM

## 2018-02-05 DIAGNOSIS — R0602 Shortness of breath: Secondary | ICD-10-CM

## 2018-02-05 DIAGNOSIS — I5189 Other ill-defined heart diseases: Secondary | ICD-10-CM

## 2018-02-05 DIAGNOSIS — R942 Abnormal results of pulmonary function studies: Secondary | ICD-10-CM

## 2018-02-05 LAB — PULMONARY FUNCTION TEST
DL/VA % pred: 120 %
DL/VA: 5.43 ml/min/mmHg/L
DLCO unc % pred: 112 %
DLCO unc: 31.75 ml/min/mmHg
FEF 25-75 Post: 4.09 L/sec
FEF 25-75 Pre: 3.44 L/sec
FEF2575-%CHANGE-POST: 18 %
FEF2575-%PRED-POST: 92 %
FEF2575-%Pred-Pre: 77 %
FEV1-%Change-Post: 3 %
FEV1-%PRED-PRE: 87 %
FEV1-%Pred-Post: 90 %
FEV1-POST: 3.81 L
FEV1-Pre: 3.68 L
FEV1FVC-%CHANGE-POST: 5 %
FEV1FVC-%PRED-PRE: 97 %
FEV6-%CHANGE-POST: -2 %
FEV6-%Pred-Post: 89 %
FEV6-%Pred-Pre: 90 %
FEV6-PRE: 4.56 L
FEV6-Post: 4.46 L
FEV6FVC-%Change-Post: 0 %
FEV6FVC-%Pred-Post: 101 %
FEV6FVC-%Pred-Pre: 100 %
FVC-%Change-Post: -1 %
FVC-%PRED-POST: 88 %
FVC-%Pred-Pre: 90 %
FVC-Post: 4.48 L
FVC-Pre: 4.56 L
POST FEV1/FVC RATIO: 85 %
PRE FEV1/FVC RATIO: 81 %
Post FEV6/FVC ratio: 100 %
Pre FEV6/FVC Ratio: 100 %
RV % PRED: 126 %
RV: 1.73 L
TLC % pred: 108 %
TLC: 6.84 L

## 2018-02-05 NOTE — Progress Notes (Signed)
Synopsis: Referred in Aug 2019 for dyspnea  Subjective:   PATIENT ID: Derrick Carter GENDER: male DOB: 14-Apr-1991, MRN: [id number]   HPI  Chief Complaint  Patient presents with  . pulmonary consult    increased SOB.     This is a 27 year old male who is never smoked cigarettes and never had a history of lung problems who comes to my clinic today for evaluation of worsening shortness of breath.  He says that he has dealt with diabetes and hypertension and hyperlipidemia over the last several years.  Because of worsening shortness of breath over the last 6 months he was initially referred to cardiology.  He had an abnormal stress test of the left heart catheterization was performed which showed no evidence of coronary artery disease but there was an increased left ventricular end-diastolic pressure so he was briefly started on a diuretic.  He says that while taking the diuretic he urinated a lot and he lost some fluid from around his belly but he did not feel any less short of breath.  When he followed up with cardiology he had an echocardiogram performed which was normal so it was advised that he stop taking the diuretic.  He tells me that he does not cough but when he walks he feels the sensation of congestion or tightness in his chest.  He says that this is fairly predictable and has been going on for about 6 months now.  At the beginning of the 74-month.  He said that there was no clear inciting illness like the flu or pneumonia.  He has had some colds over the years but nothing ever serious.  He had a normal childhood without respiratory illnesses and he has no family history of lung problems.  He has never been told in the past that there is evidence of an underlying lung problem.  Past Medical History:  Diagnosis Date  . Acanthosis 08/24/2014  . ADHD (attention deficit hyperactivity disorder)   . Annual physical exam 04/28/2014  . Attention deficit hyperactivity disorder (ADHD) 02/04/2008     Overview:  ADHD, Combined Type  ICD-10 cut over   Last Assessment & Plan:  Relevant Hx: Course: Daily Update: Today's Plan: restart Strattera  . Callus of foot 08/07/2017  . Chest pain 10/30/2017  . Chronic myofascial pain 03/23/2017  . Chronic pain disorder 03/23/2017  . Dental crown present   . Diabetes mellitus without complication (HCC)   . Diabetes mellitus, type 2 (HCC) 04/29/2014  . Hyperlipidemia   . Hypertriglyceridemia 11/19/2012  . Insomnia 03/06/2014  . Low back pain 08/19/2015  . Migraine headache 05/19/2013  . Obesity (BMI 30-39.9) 03/28/2013  . Patellofemoral syndrome, bilateral 08/07/2017  . Pilonidal cyst 03/2013  . Pilonidal disease s/p I&D 03/10/2013 11/19/2012  . 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  . Rhinitis 08/07/2017  . 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 Thoracic spondylosis without myelopathy 03/23/2017     Family History  Problem Relation Age of Onset  . Hyperlipidemia Mother   . Heart disease Mother   . Diabetes Mother   . Hypertension Mother   . Diabetes Maternal Grandmother   . Hyperlipidemia Maternal Grandmother   . Hyperlipidemia Maternal Grandfather      Social History   Socioeconomic History  . Marital status: Single    Spouse name: Not on file  . Number  of children: Not on file  . Years of education: Not on file  . Highest education level: Not on file  Occupational History  . Not on file  Social Needs  . Financial resource strain: Not on file  . Food insecurity:    Worry: Not on file    Inability: Not on file  . Transportation needs:    Medical: Not on file    Non-medical: Not on file  Tobacco Use  . Smoking status: Never Smoker  . Smokeless tobacco: Never Used  Substance and Sexual Activity  . Alcohol use: No  . Drug use: No  . Sexual activity: Never  Lifestyle  .  Physical activity:    Days per week: Not on file    Minutes per session: Not on file  . Stress: Not on file  Relationships  . Social connections:    Talks on phone: Not on file    Gets together: Not on file    Attends religious service: Not on file    Active member of club or organization: Not on file    Attends meetings of clubs or organizations: Not on file    Relationship status: Not on file  . Intimate partner violence:    Fear of current or ex partner: Not on file    Emotionally abused: Not on file    Physically abused: Not on file    Forced sexual activity: Not on file  Other Topics Concern  . Not on file  Social History Narrative  . Not on file     No Known Allergies   Outpatient Medications Prior to Visit  Medication Sig Dispense Refill  . atorvastatin (LIPITOR) 40 MG tablet TAKE 1 TABLET BY MOUTH DAILY 90 tablet 3  . diphenhydrAMINE (BENADRYL) 50 MG tablet One tab 30 minutes before desired sleep time 30 tablet 11  . fenofibrate 160 MG tablet Take 1 tablet (160 mg total) by mouth daily. 90 tablet 3  . glipiZIDE (GLUCOTROL) 10 MG tablet TAKE 1 TABLET (10 MG TOTAL) BY MOUTH 2 (TWO) TIMES DAILY BEFORE A MEAL. 180 tablet 0  . loratadine (CLARITIN) 10 MG tablet TAKE 1 TABLET BY MOUTH EVERY DAY (Patient taking differently: TAKE 1 TABLET BY MOUTH DAILY AS NEED FOR ALLERGIES) 90 tablet 0  . metoprolol tartrate (LOPRESSOR) 25 MG tablet Take 1 tablet (25 mg total) by mouth 2 (two) times daily. (Patient taking differently: Take 25 mg by mouth daily. ) 60 tablet 11  . XIGDUO XR 04-999 MG TB24 TAKE 1 TABLET BY MOUTH DAILY. 30 tablet 11   No facility-administered medications prior to visit.     Review of Systems  Constitutional: Negative.  Negative for chills, fever, malaise/fatigue and weight loss.  HENT: Positive for congestion. Negative for nosebleeds, sinus pain and sore throat.   Eyes: Negative.  Negative for photophobia, pain and discharge.  Respiratory: Positive for cough,  shortness of breath and wheezing. Negative for hemoptysis and sputum production.   Cardiovascular: Negative.  Negative for chest pain, palpitations, orthopnea and leg swelling.  Gastrointestinal: Negative.  Negative for abdominal pain, constipation, diarrhea, nausea and vomiting.  Genitourinary: Negative.  Negative for dysuria, frequency, hematuria and urgency.  Musculoskeletal: Negative.  Negative for back pain, joint pain, myalgias and neck pain.  Skin: Negative.  Negative for itching and rash.  Neurological: Negative.  Negative for tingling, tremors, sensory change, speech change, focal weakness, seizures, weakness and headaches.  Endo/Heme/Allergies: Negative.   Psychiatric/Behavioral: Negative.  Negative for memory loss,  substance abuse and suicidal ideas. The patient is not nervous/anxious.       Objective:  Physical Exam   Vitals:   02/05/18 1051  BP: 130/70  Pulse: 78  SpO2: 97%  Weight: 217 lb 1.3 oz (98.5 kg)  Height: 5\' 11"  (1.803 m)    RA  Gen: well appearing, no acute distress HENT: NCAT, OP clear, neck supple without masses Eyes: PERRL, EOMi Lymph: no cervical lymphadenopathy PULM: CTA B CV: RRR, no mgr, no JVD GI: BS+, soft, nontender, no hsm Derm: CLUBBING NOTED, no rash or skin breakdown MSK: normal bulk and tone Neuro: A&Ox4, CN II-XII intact, strength 5/5 in all 4 extremities Psyche: normal mood and affect  CBC    Component Value Date/Time   WBC 8.5 12/13/2017 1140   WBC 12.8 (H) 10/30/2017 1118   RBC 5.22 12/13/2017 1140   RBC 5.40 10/30/2017 1118   HGB 15.9 12/13/2017 1140   HCT 48.7 12/13/2017 1140   PLT 322 12/13/2017 1140   MCV 93 12/13/2017 1140   MCH 30.5 12/13/2017 1140   MCH 30.7 10/30/2017 1118   MCHC 32.6 12/13/2017 1140   MCHC 33.9 10/30/2017 1118   RDW 12.8 12/13/2017 1140   LYMPHSABS 3.0 08/19/2015 1155   MONOABS 0.8 08/19/2015 1155   EOSABS 0.1 08/19/2015 1155   BASOSABS 0.0 08/19/2015 1155     Chest imaging: May 2019  high-resolution images of his chest independently reviewed showing no pulmonary parenchymal abnormality, perhaps low lung volumes but otherwise normal in appearance.  No airway abnormality, no mediastinal abnormality.  PFT: May 2019 spirometry test from primary care: Ratio normal, forced vital capacity 4.1 L 73% predicted  Labs:  Path:  Echo: July 2019 transthoracic echocardiogram showed an LVEF of 60 to 65% and otherwise normal parameters  Heart Catheterization: June 2019 left heart catheterization showed no evidence of coronary artery disease, normal LVEF with mildly elevated left ventricular end-diastolic pressure    Records from his summer 2019 visit with cardiology reviewed where he had an abnormal stress test so he was referred for a left heart catheterization.    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  I explained to him today that the differential diagnosis of shortness of breath is broad and includes heart disease, lung disease, neuromuscular weakness, and other causes.  In his particular case it is not clear to me that there is an underlying lung disease because the CT scan of his chest showed no pulmonary parenchymal abnormality.  However, I am not sure how to explain the clubbing seen on physical exam.  His spirometry test earlier this year was suggestive of but not consistent with restriction, so he needs to have a full pulmonary function test to try to assess this further.  I am also a bit confused about the finding of elevated end-diastolic pressure on his left heart catheterization.  I do not know if this is a physiologic variant or if this could be an  explanation for his dyspnea.  It makes me wonder if ultimately he may need to have an exercise right heart catheterization.  He is deconditioned and needs to exercise more, that certainly is contributing to his shortness of breath.  Plan: Finding of elevated left  ventricular end-diastolic pressure: I will ask Dr. Dulce SellarMunley about this.  Finding of clubbing on physical exam: This abnormality can be seen with underlying lung disease, so it is important for Derrick Carter to do a lung function test to try to get to the bottom of whether or not there is an abnormal lung problem.  However, this finding can also be what is considered "a normal variant" and it may not be indicative of anything more serious.  Shortness of breath: We will check your oxygen level while walking today We will check a full pulmonary function test If the lung function test is abnormal we may need to do more testing  Follow-up in 1 to 2 weeks to go over the results of this test  The following can be used for your employer: Please note that Derrick Carter was seen today in the [facility] pulmonary clinic for shortness of breath.  He should be excused for work for this assessment.  This involved a clinic visit as well as a lung function test which took up most of the day.      Current Outpatient Medications:  .  atorvastatin (LIPITOR) 40 MG tablet, TAKE 1 TABLET BY MOUTH DAILY, Disp: 90 tablet, Rfl: 3 .  diphenhydrAMINE (BENADRYL) 50 MG tablet, One tab 30 minutes before desired sleep time, Disp: 30 tablet, Rfl: 11 .  fenofibrate 160 MG tablet, Take 1 tablet (160 mg total) by mouth daily., Disp: 90 tablet, Rfl: 3 .  glipiZIDE (GLUCOTROL) 10 MG tablet, TAKE 1 TABLET (10 MG TOTAL) BY MOUTH 2 (TWO) TIMES DAILY BEFORE A MEAL., Disp: 180 tablet, Rfl: 0 .  loratadine (CLARITIN) 10 MG tablet, TAKE 1 TABLET BY MOUTH EVERY DAY (Patient taking differently: TAKE 1 TABLET BY MOUTH DAILY AS NEED FOR ALLERGIES), Disp: 90 tablet, Rfl: 0 .  metoprolol tartrate  (LOPRESSOR) 25 MG tablet, Take 1 tablet (25 mg total) by mouth 2 (two) times daily. (Patient taking differently: Take 25 mg by mouth daily. ), Disp: 60 tablet, Rfl: 11 .  XIGDUO XR 04-999 MG TB24, TAKE 1 TABLET BY MOUTH DAILY., Disp: 30 tablet, Rfl: 11

## 2018-02-05 NOTE — Progress Notes (Signed)
PFT completed today. 02/05/18  

## 2018-02-05 NOTE — Patient Instructions (Signed)
Finding of elevated left ventricular end-diastolic pressure: I will ask Dr. Dulce SellarMunley about this.  Finding of clubbing on physical exam: This abnormality can be seen with underlying lung disease, so it is important for us to do a lung function test to try to get to the bottom of whether or not there is an abnormal lung problem.  However, this finding can also be what is considered "a normal variant" and it may not be indicative of anything more serious.  Shortness of breath: We will check your oxygen level while walking today We will check a full pulmonary function test If the lung function test is abnormal we may need to do more testing  Follow-up in 1 to 2 weeks to go over the results of this test  The following can be used for your employer: Please note that Derrick Carter was seen today in the [facility] pulmonary clinic for shortness of breath.  He should be excused for work for this assessment.  This involved a clinic visit as well as a lung function test which took up most of the day.

## 2018-02-11 ENCOUNTER — Telehealth: Payer: Self-pay | Admitting: Pulmonary Disease

## 2018-02-11 NOTE — Telephone Encounter (Signed)
Called patient unable to reach left message to give us a call back.

## 2018-02-12 NOTE — Telephone Encounter (Signed)
Per chart this was discussed with pt's mother earlier this morning with Corrine.  Nothing further needed.

## 2018-02-12 NOTE — Telephone Encounter (Signed)
Attempted to call pt's mother, Selena BattenKim. I did not receive an answer. I have left a message for Selena BattenKim to return our call.

## 2018-02-12 NOTE — Telephone Encounter (Signed)
Patient mother Selena BattenKim called and can be reached at [phone]

## 2018-02-28 ENCOUNTER — Ambulatory Visit: Payer: BC Managed Care – PPO | Admitting: Pulmonary Disease

## 2018-03-04 ENCOUNTER — Other Ambulatory Visit: Payer: Self-pay | Admitting: Sports Medicine

## 2018-03-04 DIAGNOSIS — J00 Acute nasopharyngitis [common cold]: Secondary | ICD-10-CM

## 2018-03-05 ENCOUNTER — Encounter: Payer: Self-pay | Admitting: Pulmonary Disease

## 2018-03-05 ENCOUNTER — Ambulatory Visit: Payer: BC Managed Care – PPO | Admitting: Pulmonary Disease

## 2018-03-05 VITALS — BP 124/80 | HR 75 | Ht 71.0 in | Wt 218.0 lb

## 2018-03-05 DIAGNOSIS — R0602 Shortness of breath: Secondary | ICD-10-CM

## 2018-03-05 DIAGNOSIS — R683 Clubbing of fingers: Secondary | ICD-10-CM | POA: Diagnosis not present

## 2018-03-05 DIAGNOSIS — J4520 Mild intermittent asthma, uncomplicated: Secondary | ICD-10-CM | POA: Diagnosis not present

## 2018-03-05 MED ORDER — ALBUTEROL SULFATE HFA 108 (90 BASE) MCG/ACT IN AERS: 2.0000 | INHALATION_SPRAY | Freq: Four times a day (QID) | RESPIRATORY_TRACT | 2 refills | Status: DC | PRN

## 2018-03-05 NOTE — Progress Notes (Signed)
Synopsis: Referred in Aug 2019 for dyspnea  Subjective:   PATIENT ID: Derrick Carter GENDER: male DOB: 1991/03/15, MRN: [id number]   HPI  Chief Complaint  Patient presents with  . Follow-up    1 month follow up to discuss PFT results. Patient is still having issues with SOB. Per patient, his SOB is constant. Denies any chest pain or pressure.    This is a 27 year old male who comes back to follow-up with me for shortness of breath.  He says that he still having a lot of shortness of breath even when he is at rest.  He says it will come on suddenly and he will feel the sensation that he has been exercising heavily even though he has been sitting at rest.  He denies any sort of cough or mucus production.  He is here to go over his lung function testing.  Past Medical History:  Diagnosis Date  . Acanthosis 08/24/2014  . ADHD (attention deficit hyperactivity disorder)   . Annual physical exam 04/28/2014  . Attention deficit hyperactivity disorder (ADHD) 02/04/2008   Overview:  ADHD, Combined Type  ICD-10 cut over   Last Assessment & Plan:  Relevant Hx: Course: Daily Update: Today's Plan: restart Strattera  . Callus of foot 08/07/2017  . Chest pain 10/30/2017  . Chronic myofascial pain 03/23/2017  . Chronic pain disorder 03/23/2017  . Dental crown present   . Diabetes mellitus without complication (HCC)   . Diabetes mellitus, type 2 (HCC) 04/29/2014  . Hyperlipidemia   . Hypertriglyceridemia 11/19/2012  . Insomnia 03/06/2014  . Low back pain 08/19/2015  . Migraine headache 05/19/2013  . Obesity (BMI 30-39.9) 03/28/2013  . Patellofemoral syndrome, bilateral 08/07/2017  . Pilonidal cyst 03/2013  . Pilonidal disease s/p I&D 03/10/2013 11/19/2012  . 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  . Rhinitis 08/07/2017  . Shift work sleep disorder  09/04/2017  . Thoracic spondylosis without myelopathy 03/23/2017     Review of Systems  Constitutional: Negative.  Negative for chills, fever, malaise/fatigue and weight loss.  HENT: Positive for congestion. Negative for nosebleeds, sinus pain and sore throat.   Eyes: Negative.  Negative for photophobia, pain and discharge.  Respiratory: Positive for cough, shortness of breath and wheezing. Negative for hemoptysis and sputum production.   Cardiovascular: Negative.  Negative for chest pain, palpitations, orthopnea and leg swelling.  Gastrointestinal: Negative.  Negative for abdominal pain, constipation, diarrhea, nausea and vomiting.  Genitourinary: Negative.  Negative for dysuria, frequency, hematuria and urgency.  Musculoskeletal: Negative.  Negative for back pain, joint pain, myalgias and neck pain.  Skin: Negative.  Negative for itching and rash.  Neurological: Negative.  Negative for tingling, tremors, sensory change, speech change, focal weakness, seizures, weakness and headaches.  Endo/Heme/Allergies: Negative.   Psychiatric/Behavioral: Negative.  Negative for memory loss, substance abuse and suicidal ideas. The patient is not nervous/anxious.       Objective:  Physical Exam   Vitals:   03/05/18 1054  BP: 124/80  Pulse: 75  SpO2: 96%  Weight: 218 lb (98.9 kg)  Height: 5\' 11"  (1.803 m)   Body mass index is 30.4 kg/m.   RA  Gen: well appearing HENT: OP clear, TM's clear, neck supple PULM: CTA B, normal percussion CV: RRR, no mgr, trace edema GI: BS+, soft, nontender Derm: no cyanosis or rash Psyche: normal mood and affect  CBC    Component Value Date/Time   WBC 8.5 12/13/2017 1140   WBC 12.8 (H) 10/30/2017 1118   RBC 5.22 12/13/2017 1140   RBC 5.40 10/30/2017 1118   HGB 15.9 12/13/2017 1140   HCT 48.7 12/13/2017 1140   PLT 322 12/13/2017 1140   MCV 93 12/13/2017 1140   MCH 30.5 12/13/2017 1140   MCH 30.7 10/30/2017 1118   MCHC 32.6 12/13/2017 1140    MCHC 33.9 10/30/2017 1118   RDW 12.8 12/13/2017 1140   LYMPHSABS 3.0 08/19/2015 1155   MONOABS 0.8 08/19/2015 1155   EOSABS 0.1 08/19/2015 1155   BASOSABS 0.0 08/19/2015 1155     Chest imaging: May 2019 high-resolution images of his chest independently reviewed showing no pulmonary parenchymal abnormality, perhaps low lung volumes but otherwise normal in appearance.  No airway abnormality, no mediastinal abnormality.  PFT: May 2019 spirometry test from primary care: Ratio normal, forced vital capacity 4.1 L 73% predicted Aug 2019 Ratio 85% FEV1 3.81 L (90% pred), FVC 4.48L (88% pred), 18% change in small airways with bronchodilator; TLC 6.84L (108% pred), DLCO 31.76mL 112% pred  Labs:  Path:  Echo: July 2019 transthoracic echocardiogram showed an LVEF of 60 to 65% and otherwise normal parameters  Heart Catheterization: June 2019 left heart catheterization showed no evidence of coronary artery disease, normal LVEF with mildly elevated left ventricular end-diastolic pressure      Assessment & Plan:   No diagnosis found.  Discussion: Its very difficult for me to say that Derrick Carter has a significant lung problem considering his normal physical exam, oxygenation, lung function testing and high-resolution CT scan of the chest.  He did have a minor improvement in his small airways with albuterol use so with his allergic rhinitis as opposed its possible he could have mild intermittent asthma.  Plan: Mild intermittent asthma: Use albuterol 2 puffs as needed for shortness of breath Use it with a spacer Get a flu shot at work Practice good hand hygiene  Allergic rhinitis: Continue Claritin daily Start using Flonase 2 sprays each nostril daily, generic is okay  Shortness of breath: If you are still having shortness of breath and 4 to 6 weeks after using albuterol let us know and we can make arrangements for a cardiopulmonary exercise stress test  We will see you back in 6 weeks or  sooner if needed     Current Outpatient Medications:  .  atorvastatin (LIPITOR) 40 MG tablet, TAKE 1 TABLET BY MOUTH DAILY, Disp: 90 tablet, Rfl: 3 .  diphenhydrAMINE (BENADRYL) 50 MG tablet, One tab 30 minutes before desired sleep time, Disp: 30 tablet, Rfl: 11 .  fenofibrate 160 MG tablet, Take 1 tablet (160 mg total) by mouth daily., Disp: 90 tablet, Rfl: 3 .  glipiZIDE (GLUCOTROL) 10 MG tablet, TAKE 1 TABLET (10 MG TOTAL) BY MOUTH 2 (TWO) TIMES DAILY BEFORE A MEAL., Disp: 180 tablet, Rfl: 0 .  loratadine (CLARITIN) 10 MG tablet, TAKE 1 TABLET BY MOUTH EVERY DAY, Disp: 90 tablet, Rfl: 0 .  metoprolol tartrate (LOPRESSOR) 25 MG tablet, Take 1 tablet (25 mg total) by mouth 2 (two) times daily. (Patient taking differently: Take 25 mg by mouth daily. ), Disp: 60 tablet, Rfl: 11 .  XIGDUO XR 04-999 MG TB24, TAKE 1 TABLET BY MOUTH DAILY., Disp: 30 tablet, Rfl: 11

## 2018-03-05 NOTE — Patient Instructions (Signed)
Mild intermittent asthma: Use albuterol 2 puffs as needed for shortness of breath Use it with a spacer Get a flu shot at work Practice good hand hygiene  Allergic rhinitis: Continue Claritin daily Start using Flonase 2 sprays each nostril daily, generic is okay  Shortness of breath: If you are still having shortness of breath and 4 to 6 weeks after using albuterol let us know and we can make arrangements for a cardiopulmonary exercise stress test  We will see you back in 6 weeks or sooner if needed

## 2018-03-26 ENCOUNTER — Other Ambulatory Visit: Payer: Self-pay | Admitting: Sports Medicine

## 2018-03-26 DIAGNOSIS — E119 Type 2 diabetes mellitus without complications: Secondary | ICD-10-CM

## 2018-03-28 ENCOUNTER — Ambulatory Visit: Payer: BC Managed Care – PPO | Admitting: Sports Medicine

## 2018-03-28 VITALS — BP 112/77 | HR 76 | Ht 71.0 in | Wt 219.0 lb

## 2018-03-28 DIAGNOSIS — Z23 Encounter for immunization: Secondary | ICD-10-CM | POA: Diagnosis not present

## 2018-03-28 DIAGNOSIS — R0602 Shortness of breath: Secondary | ICD-10-CM

## 2018-03-28 DIAGNOSIS — E782 Mixed hyperlipidemia: Secondary | ICD-10-CM

## 2018-03-28 DIAGNOSIS — I5189 Other ill-defined heart diseases: Secondary | ICD-10-CM

## 2018-03-28 DIAGNOSIS — E119 Type 2 diabetes mellitus without complications: Secondary | ICD-10-CM | POA: Diagnosis not present

## 2018-03-28 LAB — POCT GLYCOSYLATED HEMOGLOBIN (HGB A1C): Hemoglobin A1C: 5.8 % — AB (ref 4.0–5.6)

## 2018-03-28 LAB — POCT UA - MICROALBUMIN
Albumin/Creatinine Ratio, Urine, POC: <30
Creatinine, POC: 200 mg/dL
Microalbumin Ur, POC: 30 mg/L

## 2018-03-28 MED ORDER — FUROSEMIDE 20 MG PO TABS: 10.0000 mg | ORAL_TABLET | Freq: Every day | ORAL | 3 refills | Status: DC

## 2018-03-28 NOTE — Assessment & Plan Note (Signed)
Did well with a low-dose of furosemide, adding 10 mg.

## 2018-03-28 NOTE — Assessment & Plan Note (Signed)
Rechecking hemoglobin A1c, continues with glipizide, XigDuo. Due for urine microalbumin, flu shot. A1c is down to 5.8%, discontinue glipizide, continue XigDuo for now.

## 2018-03-28 NOTE — Progress Notes (Signed)
Subjective:    CC: Multiple medical issues  HPI: Hypertension: Controlled.  Diastolic dysfunction: Responded well to low-dose furosemide he did have a clear catheterization and an otherwise normal echocardiogram with the exception of impaired diastolic function.  Diabetes mellitus type 2: Historically has been on glipizide and XigDuo, hemoglobin A1c today =5.8%.  Hyperlipidemia: Stable on current medications.  Shortness of breath: Normal cardiac catheterization, echocardiogram with the exception of mild diastolic dysfunction, normal pulmonary function testing with the exception of minimal post bronchial dilator improvement.  Pulmonology started him on albuterol and he reports complete resolution of all of his symptoms.  I reviewed the past medical history, family history, social history, surgical history, and allergies today and no changes were needed.  Please see the problem list section below in epic for further details.  Past Medical History: Past Medical History:  Diagnosis Date  . Acanthosis 08/24/2014  . ADHD (attention deficit hyperactivity disorder)   . Annual physical exam 04/28/2014  . Attention deficit hyperactivity disorder (ADHD) 02/04/2008   Overview:  ADHD, Combined Type  ICD-10 cut over   Last Assessment & Plan:  Relevant Hx: Course: Daily Update: Today's Plan: restart Strattera  . Callus of foot 08/07/2017  . Chest pain 10/30/2017  . Chronic myofascial pain 03/23/2017  . Chronic pain disorder 03/23/2017  . Dental crown present   . Diabetes mellitus without complication (HCC)   . Diabetes mellitus, type 2 (HCC) 04/29/2014  . Hyperlipidemia   . Hypertriglyceridemia 11/19/2012  . Insomnia 03/06/2014  . Low back pain 08/19/2015  . Migraine headache 05/19/2013  . Obesity (BMI 30-39.9) 03/28/2013  . Patellofemoral syndrome, bilateral 08/07/2017  . Pilonidal cyst 03/2013  . Pilonidal disease s/p I&D 03/10/2013 11/19/2012  . Restrictive pattern present on pulmonary function testing  10/30/2017   Lung function test 11/22/2017 FVC 73% predicted FEV1 76% predicted, FEV1/FVC 104% predicted, no significant change postbronchodilator.  . Rhinitis 08/07/2017  . 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 Thoracic spondylosis without myelopathy 03/23/2017   Past Surgical History: Past Surgical History:  Procedure Laterality Date  . LEFT HEART CATH AND CORONARY ANGIOGRAPHY N/A 12/24/2017   Procedure: LEFT HEART CATH AND CORONARY ANGIOGRAPHY;  Surgeon: Yvonne Kendall, MD;  Location: [facility];  Service: Cardiovascular;  Laterality: N/A;  . NO PAST SURGERIES    . PILONIDAL CYST EXCISION N/A 03/10/2013   Procedure: CYST EXCISION PILONIDAL SIMPLE I and D ;  Surgeon: Robyne Askew, MD;  Location: [facility];  Service: General;  Laterality: N/A;   Social History: Social History   Socioeconomic History  . Marital status: Single    Spouse name: Not on file  . Number of children: Not on file  . Years of education: Not on file  . Highest education level: Not on file  Occupational History  . Not on file  Social Needs  . Financial resource strain: Not on file  . Food insecurity:    Worry: Not on file    Inability: Not on file  . Transportation needs:    Medical: Not on file    Non-medical: Not on file  Tobacco Use  . Smoking status: Never Smoker  . Smokeless tobacco: Never Used  Substance and Sexual Activity  . Alcohol use: No  . Drug use: No  . Sexual activity: Never  Lifestyle  . Physical activity:    Days per week: Not on file    Minutes per session: Not on file  . Stress: Not on  file  Relationships  . Social connections:    Talks on phone: Not on file    Gets together: Not on file    Attends religious service: Not on file    Active member of club or organization: Not on file    Attends meetings of clubs or organizations: Not on file    Relationship status: Not on file  Other Topics Concern  . Not on file  Social History Narrative  . Not on  file   Family History: Family History  Problem Relation Age of Onset  . Hyperlipidemia Mother   . Heart disease Mother   . Diabetes Mother   . Hypertension Mother   . Diabetes Maternal Grandmother   . Hyperlipidemia Maternal Grandmother   . Hyperlipidemia Maternal Grandfather    Allergies: No Known Allergies Medications: See med rec.  Review of Systems: No fevers, chills, night sweats, weight loss, chest pain, or shortness of breath.   Objective:    General: Well Developed, well nourished, and in no acute distress.  Neuro: Alert and oriented x3, extra-ocular muscles intact, sensation grossly intact.  HEENT: Normocephalic, atraumatic, pupils equal round reactive to light, neck supple, no masses, no lymphadenopathy, thyroid nonpalpable.  Skin: Warm and dry, no rashes. Cardiac: Regular rate and rhythm, no murmurs rubs or gallops, no lower extremity edema.  Respiratory: Clear to auscultation bilaterally. Not using accessory muscles, speaking in full sentences.  Impression and Recommendations:    Intermittent shortness of breath Shortness of breath is likely supratentorial and more related to deconditioning. He did have slight improvement in function with albuterol, albuterol at home has provided some good relief in symptoms. I do not think he needs any further evaluation.  Hyperlipidemia Lipids are well controlled.  Diabetes mellitus, type 2 Rechecking hemoglobin A1c, continues with glipizide, XigDuo. Due for urine microalbumin, flu shot. A1c is down to 5.8%, discontinue glipizide, continue XigDuo for now.  Diastolic dysfunction Did well with a low-dose of furosemide, adding 10 mg. ___________________________________________ Derrick Carter. Benjamin Stain, M.D., ABFM., CAQSM. Primary Care and Sports Medicine [facility]  Adjunct Instructor of Family Medicine  [employer]

## 2018-03-28 NOTE — Assessment & Plan Note (Signed)
Lipids are well controlled. ?

## 2018-03-28 NOTE — Assessment & Plan Note (Addendum)
Shortness of breath is likely supratentorial and more related to deconditioning. He did have slight improvement in function with albuterol, albuterol at home has provided some good relief in symptoms. I do not think he needs any further evaluation.

## 2018-04-15 ENCOUNTER — Ambulatory Visit: Payer: BC Managed Care – PPO | Admitting: Cardiology

## 2018-04-15 ENCOUNTER — Encounter: Payer: Self-pay | Admitting: Cardiology

## 2018-04-15 ENCOUNTER — Encounter: Payer: Self-pay | Admitting: *Deleted

## 2018-04-15 VITALS — BP 116/90 | HR 66 | Ht 71.0 in | Wt 218.0 lb

## 2018-04-15 DIAGNOSIS — R0602 Shortness of breath: Secondary | ICD-10-CM

## 2018-04-15 DIAGNOSIS — E119 Type 2 diabetes mellitus without complications: Secondary | ICD-10-CM

## 2018-04-15 DIAGNOSIS — J45909 Unspecified asthma, uncomplicated: Secondary | ICD-10-CM | POA: Insufficient documentation

## 2018-04-15 DIAGNOSIS — J4520 Mild intermittent asthma, uncomplicated: Secondary | ICD-10-CM | POA: Diagnosis not present

## 2018-04-15 NOTE — Progress Notes (Signed)
Cardiology Office Note:    Date:  04/15/2018   ID:  Derrick Carter, DOB 03/09/1991, MRN [id number]  PCP:  Monica Becton, MD  Cardiologist:  Norman Herrlich, MD    Referring MD: Monica Becton,*    ASSESSMENT:    1. SOB (shortness of breath) on exertion   2. Mild intermittent asthma without complication   3. Type 2 diabetes mellitus without complication, without long-term current use of insulin (HCC)    PLAN:    In order of problems listed above:  1. Resolved with his bronchodilator continue the same.  In retrospect I do not think he ever had diastolic heart failure and asked him to stop his diuretic especially taking SGLT2 agent for diabetes which is cardioprotective and has a distal diuretic effect. 2. Improved continue his bronchodilator 3. Very well controlled A1c is normal continue combination SGLT2 Glucophage   Next appointment: As needed   Medication Adjustments/Labs and Tests Ordered: Current medicines are reviewed at length with the patient today.  Concerns regarding medicines are outlined above.  No orders of the defined types were placed in this encounter.  No orders of the defined types were placed in this encounter.   Chief Complaint  Patient presents with  . Follow-up  . Shortness of Breath    History of Present Illness:    Derrick Carter is a 27 y.o. male with a hx of T2 DM, hyperlipidemia, chest pain and abnormal MPI  last seen 12/13/17. Left heart cath 12/24/17 showed normal coronary angiography and elevated LVEDP 20-25 mm Hg  last seen by me 01/02/18. He has been seen by pulmonary and treated for asthma. He is on  dapagliflozin - metformin HCl extended-release? for his T2 DM.  Compliance with diet, lifestyle and medications: Yes  He is pleased with the quality of his life shortness of breath is resolved with his bronchodilator and he said no further wheezing.  Takes low-dose loop diuretic I do not believe he ever had heart failure and  Avastin to discontinue and reassured him that his choice of medications for type 2 diabetes has a diuretic effect and is cardioprotective for heart failure.  He is reassured.  No edema orthopnea chest pain. Past Medical History:  Diagnosis Date  . Acanthosis 08/24/2014  . ADHD (attention deficit hyperactivity disorder)   . Annual physical exam 04/28/2014  . Attention deficit hyperactivity disorder (ADHD) 02/04/2008   Overview:  ADHD, Combined Type  ICD-10 cut over   Last Assessment & Plan:  Relevant Hx: Course: Daily Update: Today's Plan: restart Strattera  . Callus of foot 08/07/2017  . Chest pain 10/30/2017  . Chronic myofascial pain 03/23/2017  . Chronic pain disorder 03/23/2017  . Dental crown present   . Diabetes mellitus without complication (HCC)   . Diabetes mellitus, type 2 (HCC) 04/29/2014  . Hyperlipidemia   . Hypertriglyceridemia 11/19/2012  . Insomnia 03/06/2014  . Low back pain 08/19/2015  . Migraine headache 05/19/2013  . Obesity (BMI 30-39.9) 03/28/2013  . Patellofemoral syndrome, bilateral 08/07/2017  . Pilonidal cyst 03/2013  . Pilonidal disease s/p I&D 03/10/2013 11/19/2012  . 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  . Rhinitis 08/07/2017  . 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 Thoracic spondylosis without myelopathy 03/23/2017    Past Surgical History:  Procedure Laterality Date  . LEFT HEART CATH AND CORONARY ANGIOGRAPHY N/A  12/24/2017   Procedure: LEFT HEART CATH AND CORONARY ANGIOGRAPHY;  Surgeon: Yvonne Kendall, MD;  Location: [facility];  Service: Cardiovascular;  Laterality: N/A;  . NO PAST SURGERIES    . PILONIDAL CYST EXCISION N/A 03/10/2013   Procedure: CYST EXCISION PILONIDAL SIMPLE I and D ;  Surgeon: Caleen Essex III, MD;  Location: [facility];  Service: General;  Laterality: N/A;     Current Medications: Current Meds  Medication Sig  . albuterol (PROVENTIL HFA;VENTOLIN HFA) 108 (90 Base) MCG/ACT inhaler Inhale 2 puffs into the lungs every 6 (six) hours as needed for wheezing or shortness of breath.  Marland Kitchen atorvastatin (LIPITOR) 40 MG tablet TAKE 1 TABLET BY MOUTH DAILY  . diphenhydrAMINE (BENADRYL) 50 MG tablet One tab 30 minutes before desired sleep time  . fenofibrate 160 MG tablet Take 1 tablet (160 mg total) by mouth daily.  Marland Kitchen loratadine (CLARITIN) 10 MG tablet TAKE 1 TABLET BY MOUTH EVERY DAY  . XIGDUO XR 04-999 MG TB24 TAKE 1 TABLET BY MOUTH DAILY.  . [DISCONTINUED] furosemide (LASIX) 20 MG tablet Take 0.5 tablets (10 mg total) by mouth daily.     Allergies:   Patient has no known allergies.   Social History   Socioeconomic History  . Marital status: Single    Spouse name: Not on file  . Number of children: Not on file  . Years of education: Not on file  . Highest education level: Not on file  Occupational History  . Not on file  Social Needs  . Financial resource strain: Not on file  . Food insecurity:    Worry: Not on file    Inability: Not on file  . Transportation needs:    Medical: Not on file    Non-medical: Not on file  Tobacco Use  . Smoking status: Never Smoker  . Smokeless tobacco: Never Used  Substance and Sexual Activity  . Alcohol use: No  . Drug use: No  . Sexual activity: Never  Lifestyle  . Physical activity:    Days per week: Not on file    Minutes per session: Not on file  . Stress: Not on file  Relationships  . Social connections:    Talks on phone: Not on file    Gets together: Not on file    Attends religious service: Not on file    Active member of club or organization: Not on file    Attends meetings of clubs or organizations: Not on file    Relationship status: Not on file  Other Topics Concern  . Not on file  Social History Narrative  . Not on file     Family History: The patient's family history  includes Diabetes in his maternal grandmother and mother; Heart disease in his mother; Hyperlipidemia in his maternal grandfather, maternal grandmother, and mother; Hypertension in his mother. ROS:   Please see the history of present illness.    All other systems reviewed and are negative.  EKGs/Labs/Other Studies Reviewed:    The following studies were reviewed today:  Recent Labs: 10/30/2017: ALT 21; TSH 3.92 12/13/2017: Hemoglobin 15.9; Platelets 322 01/02/2018: BUN 19; Creatinine, Ser 0.97; NT-Pro BNP 25; Potassium 4.0; Sodium 139  Recent Lipid Panel    Component Value Date/Time   CHOL 149 10/30/2017 1118   TRIG 99 10/30/2017 1118   HDL 38 (L) 10/30/2017 1118   CHOLHDL 3.9 10/30/2017 1118   VLDL 39 (H) 08/05/2015 1145   LDLCALC 92 10/30/2017  1118   LDLDIRECT 82 05/18/2015 1158    Physical Exam:    VS:  BP 116/90 (BP Location: Left Arm, Patient Position: Sitting, Cuff Size: Normal)   Pulse 66   Ht 5\' 11"  (1.803 m)   Wt 218 lb (98.9 kg)   SpO2 97%   BMI 30.40 kg/m     Wt Readings from Last 3 Encounters:  04/15/18 218 lb (98.9 kg)  03/28/18 219 lb (99.3 kg)  03/05/18 218 lb (98.9 kg)     GEN:  Well nourished, well developed in no acute distress HEENT: Normal NECK: No JVD; No carotid bruits LYMPHATICS: No lymphadenopathy CARDIAC: RRR, no murmurs, rubs, gallops RESPIRATORY:  Clear to auscultation without rales, wheezing or rhonchi  ABDOMEN: Soft, non-tender, non-distended MUSCULOSKELETAL:  No edema; No deformity  SKIN: Warm and dry NEUROLOGIC:  Alert and oriented x 3 PSYCHIATRIC:  Normal affect    Signed, Norman Herrlich, MD  04/15/2018 10:44 AM    [facility]

## 2018-04-15 NOTE — Patient Instructions (Signed)
Medication Instructions:  Your physician has recommended you make the following change in your medication:   STOP furosemide (lasix)   If you need a refill on your cardiac medications before your next appointment, please call your pharmacy.   Lab work: None  Testing/Procedures: None  Follow-Up: Your physician recommends that you schedule a follow-up appointment as needed if symptoms worsen or fail to improve.

## 2018-05-07 ENCOUNTER — Ambulatory Visit: Payer: BC Managed Care – PPO | Admitting: Pulmonary Disease

## 2018-05-07 ENCOUNTER — Encounter: Payer: Self-pay | Admitting: Pulmonary Disease

## 2018-05-07 VITALS — BP 128/86 | HR 88 | Ht 71.0 in | Wt 211.0 lb

## 2018-05-07 DIAGNOSIS — J4530 Mild persistent asthma, uncomplicated: Secondary | ICD-10-CM

## 2018-05-07 MED ORDER — FLUTICASONE FUROATE 100 MCG/ACT IN AEPB: 1.0000 | INHALATION_SPRAY | Freq: Every day | RESPIRATORY_TRACT | 5 refills | Status: DC

## 2018-05-07 MED ORDER — FLUTICASONE FUROATE 100 MCG/ACT IN AEPB: 1.0000 | INHALATION_SPRAY | Freq: Every day | RESPIRATORY_TRACT | 0 refills | Status: DC

## 2018-05-07 NOTE — Patient Instructions (Signed)
Mild persistent asthma: Start taking Arnuity 1 puff daily no matter how you feel Use albuterol as needed for chest tightness wheezing or shortness of breath Call us if the Arnuity is excessively expensive, its likely that your insurance may cover an alternative if Arnuity is not covered I am glad that you have had a flu shot Practice good hand hygiene Stay active Consider having an allergy evaluation  We will see you back in 3 months or sooner if needed

## 2018-05-07 NOTE — Progress Notes (Signed)
Synopsis: Referred in Aug 2019 for dyspnea; Cardiac cath normal, lung function testing with minor improvement in airflow in small airways after bronchodilator.   Subjective:   PATIENT ID: Derrick Carter GENDER: male DOB: 10/06/1990, MRN: [id number]   HPI  Chief Complaint  Patient presents with  . Follow-up    6 wk f/u for SOB. States his breathing has been a lot better since his last visit.    Derrick Carter says that his shortness of breath has improved significantly since last visit.  He says that albuterol has helped quite a bit.  He uses it approximately twice a day.  He typically uses it prior to going to work in the morning and then again when he comes home from work.  He tells me that he wonders if he may have a dust allergy because he says that sometimes his symptoms are worse when he is at work where there is a significant amount of dust.  Claritin helps with this.  He had a flu shot this year.  Past Medical History:  Diagnosis Date  . Acanthosis 08/24/2014  . ADHD (attention deficit hyperactivity disorder)   . Annual physical exam 04/28/2014  . Attention deficit hyperactivity disorder (ADHD) 02/04/2008   Overview:  ADHD, Combined Type  ICD-10 cut over   Last Assessment & Plan:  Relevant Hx: Course: Daily Update: Today's Plan: restart Strattera  . Callus of foot 08/07/2017  . Chest pain 10/30/2017  . Chronic myofascial pain 03/23/2017  . Chronic pain disorder 03/23/2017  . Dental crown present   . Diabetes mellitus without complication (HCC)   . Diabetes mellitus, type 2 (HCC) 04/29/2014  . Hyperlipidemia   . Hypertriglyceridemia 11/19/2012  . Insomnia 03/06/2014  . Low back pain 08/19/2015  . Migraine headache 05/19/2013  . Obesity (BMI 30-39.9) 03/28/2013  . Patellofemoral syndrome, bilateral 08/07/2017  . Pilonidal cyst 03/2013  . Pilonidal disease s/p I&D 03/10/2013 11/19/2012  . Restrictive pattern present on pulmonary function testing 10/30/2017   Lung function test 11/22/2017 FVC 73%  predicted FEV1 76% predicted, FEV1/FVC 104% predicted, no significant change postbronchodilator.  . Rhinitis 08/07/2017  . 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 Thoracic spondylosis without myelopathy 03/23/2017     Review of Systems  Constitutional: Negative.  Negative for chills, fever, malaise/fatigue and weight loss.  HENT: Positive for congestion. Negative for nosebleeds, sinus pain and sore throat.   Eyes: Negative.  Negative for photophobia, pain and discharge.  Respiratory: Positive for cough, shortness of breath and wheezing. Negative for hemoptysis and sputum production.   Cardiovascular: Negative.  Negative for chest pain, palpitations, orthopnea and leg swelling.  Gastrointestinal: Negative.  Negative for abdominal pain, constipation, diarrhea, nausea and vomiting.  Genitourinary: Negative.  Negative for dysuria, frequency, hematuria and urgency.  Musculoskeletal: Negative.  Negative for back pain, joint pain, myalgias and neck pain.  Skin: Negative.  Negative for itching and rash.  Neurological: Negative.  Negative for tingling, tremors, sensory change, speech change, focal weakness, seizures, weakness and headaches.  Endo/Heme/Allergies: Negative.   Psychiatric/Behavioral: Negative.  Negative for memory loss, substance abuse and suicidal ideas. The patient is not nervous/anxious.       Objective:  Physical Exam   Vitals:   05/07/18 0945  BP: 128/86  Pulse: 88  SpO2: 97%  Weight: 211 lb (95.7 kg)  Height: 5\' 11"  (1.803 m)   Body mass index is 29.43 kg/m.   RA  Gen: well appearing HENT: OP clear, TM's clear, neck supple PULM:  CTA B, normal percussion CV: RRR, no mgr, trace edema GI: BS+, soft, nontender Derm: no cyanosis or rash Psyche: normal mood and affect    CBC    Component Value Date/Time   WBC 8.5 12/13/2017 1140   WBC 12.8 (H) 10/30/2017 1118   RBC 5.22 12/13/2017 1140   RBC 5.40 10/30/2017 1118   HGB 15.9 12/13/2017 1140   HCT 48.7  12/13/2017 1140   PLT 322 12/13/2017 1140   MCV 93 12/13/2017 1140   MCH 30.5 12/13/2017 1140   MCH 30.7 10/30/2017 1118   MCHC 32.6 12/13/2017 1140   MCHC 33.9 10/30/2017 1118   RDW 12.8 12/13/2017 1140   LYMPHSABS 3.0 08/19/2015 1155   MONOABS 0.8 08/19/2015 1155   EOSABS 0.1 08/19/2015 1155   BASOSABS 0.0 08/19/2015 1155     Chest imaging: May 2019 high-resolution images of his chest independently reviewed showing no pulmonary parenchymal abnormality, perhaps low lung volumes but otherwise normal in appearance.  No airway abnormality, no mediastinal abnormality.  PFT: May 2019 spirometry test from primary care: Ratio normal, forced vital capacity 4.1 L 73% predicted Aug 2019 Ratio 85% FEV1 3.81 L (90% pred), FVC 4.48L (88% pred), 18% change in small airways with bronchodilator; TLC 6.84L (108% pred), DLCO 31.10mL 112% pred  Labs:  Path:  Echo: July 2019 transthoracic echocardiogram showed an LVEF of 60 to 65% and otherwise normal parameters  Heart Catheterization: June 2019 left heart catheterization showed no evidence of coronary artery disease, normal LVEF with mildly elevated left ventricular end-diastolic pressure      Assessment & Plan:   Mild persistent allergic asthma  Discussion: Derrick Carter has mild persistent asthma.  He gets significant symptom relief with albuterol but he is using it quite frequently so he needs a controller medicine.  We talked about the difference between controller and rescue medicines at length today.  His immunizations are all up-to-date.  We talked about the importance of practicing good hand hygiene and staying active.  Plan: Mild persistent asthma: Start taking Arnuity 1 puff daily no matter how you feel Use albuterol as needed for chest tightness wheezing or shortness of breath Call us if the Arnuity is excessively expensive, its likely that your insurance may cover an alternative if Arnuity is not covered I am glad that you have had a  flu shot Practice good hand hygiene Stay active Consider having an allergy evaluation  We will see you back in 3 months or sooner if needed     Current Outpatient Medications:  .  albuterol (PROVENTIL HFA;VENTOLIN HFA) 108 (90 Base) MCG/ACT inhaler, Inhale 2 puffs into the lungs every 6 (six) hours as needed for wheezing or shortness of breath., Disp: 1 Inhaler, Rfl: 2 .  atorvastatin (LIPITOR) 40 MG tablet, TAKE 1 TABLET BY MOUTH DAILY, Disp: 90 tablet, Rfl: 3 .  diphenhydrAMINE (BENADRYL) 50 MG tablet, One tab 30 minutes before desired sleep time, Disp: 30 tablet, Rfl: 11 .  fenofibrate 160 MG tablet, Take 1 tablet (160 mg total) by mouth daily., Disp: 90 tablet, Rfl: 3 .  loratadine (CLARITIN) 10 MG tablet, TAKE 1 TABLET BY MOUTH EVERY DAY, Disp: 90 tablet, Rfl: 0 .  XIGDUO XR 04-999 MG TB24, TAKE 1 TABLET BY MOUTH DAILY., Disp: 30 tablet, Rfl: 11 .  Fluticasone Furoate (ARNUITY ELLIPTA) 100 MCG/ACT AEPB, Inhale 1 puff into the lungs daily., Disp: 30 each, Rfl: 5 .  Fluticasone Furoate (ARNUITY ELLIPTA) 100 MCG/ACT AEPB, Inhale 1 puff into the lungs daily.,  Disp: 30 each, Rfl: 0

## 2018-06-06 ENCOUNTER — Encounter: Payer: Self-pay | Admitting: Sports Medicine

## 2018-06-06 ENCOUNTER — Ambulatory Visit: Payer: BC Managed Care – PPO | Admitting: Sports Medicine

## 2018-06-06 DIAGNOSIS — L649 Androgenic alopecia, unspecified: Secondary | ICD-10-CM

## 2018-06-06 DIAGNOSIS — L0232 Furuncle of buttock: Secondary | ICD-10-CM

## 2018-06-06 MED ORDER — FINASTERIDE 1 MG PO TABS: 1.0000 mg | ORAL_TABLET | Freq: Every day | ORAL | 3 refills | Status: DC

## 2018-06-06 MED ORDER — MINOXIDIL 5 % EX FOAM: 1.0000 "application " | Freq: Two times a day (BID) | CUTANEOUS | 11 refills | Status: DC

## 2018-06-06 MED ORDER — SULFAMETHOXAZOLE-TRIMETHOPRIM 800-160 MG PO TABS: 1.0000 | ORAL_TABLET | Freq: Two times a day (BID) | ORAL | 0 refills | Status: AC

## 2018-06-06 NOTE — Assessment & Plan Note (Signed)
Adding Rogaine and finasteride.

## 2018-06-06 NOTE — Progress Notes (Signed)
Subjective:    CC: Sore on buttock  HPI: Tremar is a pleasant 27 year old male, 6 months ago we treated him for a furuncle on his left buttock, he responded well to doxycycline.  More recently he is noticed a recurrence, erythematous, minimally tender without drainage.  Moderate, persistent.  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  I reviewed the past medical history, family history, social history, surgical history, and allergies today and no changes were needed.  Please see the problem list section below in epic for further details.  Past Medical History: Past Medical History:  Diagnosis Date  . Acanthosis 08/24/2014  . ADHD (attention deficit hyperactivity disorder)   . Annual physical exam 04/28/2014  . Attention deficit hyperactivity disorder (ADHD) 02/04/2008   Overview:  ADHD, Combined Type  ICD-10 cut over   Last Assessment & Plan:  Relevant Hx: Course: Daily Update: Today's Plan: restart Strattera  . Callus of foot 08/07/2017  . Chest pain 10/30/2017  . Chronic myofascial pain 03/23/2017  . Chronic pain disorder 03/23/2017  . Dental crown present   . Diabetes mellitus without complication (HCC)   . Diabetes mellitus, type 2 (HCC) 04/29/2014  . Hyperlipidemia   . Hypertriglyceridemia 11/19/2012  . Insomnia 03/06/2014  . Low back pain 08/19/2015  . Migraine headache 05/19/2013  . Obesity (BMI 30-39.9) 03/28/2013  . Patellofemoral syndrome, bilateral 08/07/2017  . Pilonidal cyst 03/2013  . Pilonidal disease s/p I&D 03/10/2013 11/19/2012  . 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  . Rhinitis 08/07/2017  . 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 Thoracic spondylosis without myelopathy 03/23/2017   Past Surgical History: Past Surgical History:  Procedure Laterality Date  . LEFT HEART CATH  AND CORONARY ANGIOGRAPHY N/A 12/24/2017   Procedure: LEFT HEART CATH AND CORONARY ANGIOGRAPHY;  Surgeon: Yvonne Kendall, MD;  [facility]: [facility];  Service: Cardiovascular;  Laterality: N/A;  . NO PAST SURGERIES    . PILONIDAL CYST EXCISION N/A 03/10/2013   Procedure: CYST EXCISION PILONIDAL SIMPLE I and D ;  Surgeon: Robyne Askew, MD;  Location: [facility];  Service: General;  Laterality: N/A;   Social History: Social History   Socioeconomic History  . Marital status: Single    Spouse name: Not on file  . Number of children: Not on file  . Years of education: Not on file  . Highest education level: Not on file  Occupational History  . Not on file  Social Needs  . Financial resource strain: Not on file  . Food insecurity:    Worry: Not on file    Inability: Not on file  . Transportation needs:    Medical: Not on file    Non-medical: Not on file  Tobacco Use  . Smoking status: Never Smoker  . Smokeless tobacco: Never Used  Substance and Sexual Activity  . Alcohol use: No  . Drug use: No  . Sexual activity: Never  Lifestyle  . Physical activity:    Days per week: Not on file    Minutes per session: Not on file  . Stress: Not on file  Relationships  . Social connections:    Talks on phone: Not on file    Gets together: Not on file    Attends religious service: Not on file    Active member of club  or organization: Not on file    Attends meetings of clubs or organizations: Not on file    Relationship status: Not on file  Other Topics Concern  . Not on file  Social History Narrative  . Not on file   Family History: Family History  Problem Relation Age of Onset  . Hyperlipidemia Mother   . Heart disease Mother   . Diabetes Mother   . Hypertension Mother   . Diabetes Maternal Grandmother   . Hyperlipidemia Maternal Grandmother   . Hyperlipidemia Maternal Grandfather    Allergies: No Known Allergies Medications: See med rec.  Review of  Systems: No fevers, chills, night sweats, weight loss, chest pain, or shortness of breath.   Objective:    General: Well Developed, well nourished, and in no acute distress.  Neuro: Alert and oriented x3, extra-ocular muscles intact, sensation grossly intact.  HEENT: Normocephalic, atraumatic, pupils equal round reactive to light, neck supple, no masses, no lymphadenopathy, thyroid nonpalpable.  Mild male pattern balding Skin: Warm and dry, no rashes.  There is a small furuncle on his left buttock without drainage, no induration. Cardiac: Regular rate and rhythm, no murmurs rubs or gallops, no lower extremity edema.  Respiratory: Clear to auscultation bilaterally. Not using accessory muscles, speaking in full sentences.  Impression and Recommendations:    Furuncle of buttock Slight recurrence, we treated this about 6 months ago. Switching to Septra for 14 days. If persistent nodule after 14 days I will do a surgical excision.  Male pattern baldness Adding Rogaine and finasteride. ___________________________________________ Ihor Austinhomas J. Benjamin Stainhekkekandam, M.D., ABFM., CAQSM. Primary Care and Sports Medicine [facility]  Adjunct Professor of Family Medicine  [employer]

## 2018-06-06 NOTE — Assessment & Plan Note (Signed)
Slight recurrence, we treated this about 6 months ago. Switching to Septra for 14 days. If persistent nodule after 14 days I will do a surgical excision.

## 2018-06-11 HISTORY — PX: KNEE ARTHROSCOPY W/ MENISCAL REPAIR: SHX1877

## 2018-06-20 ENCOUNTER — Encounter: Payer: Self-pay | Admitting: Adult Health

## 2018-06-20 ENCOUNTER — Ambulatory Visit: Payer: BC Managed Care – PPO | Admitting: Adult Health

## 2018-06-20 DIAGNOSIS — J4520 Mild intermittent asthma, uncomplicated: Secondary | ICD-10-CM | POA: Diagnosis not present

## 2018-06-20 DIAGNOSIS — J310 Chronic rhinitis: Secondary | ICD-10-CM | POA: Diagnosis not present

## 2018-06-20 MED ORDER — MOMETASONE FURO-FORMOTEROL FUM 100-5 MCG/ACT IN AERO: 2.0000 | INHALATION_SPRAY | RESPIRATORY_TRACT | 0 refills | Status: DC

## 2018-06-20 NOTE — Assessment & Plan Note (Signed)
Trigger prevention   Plan  Patient Instructions  Stop Arnuity .  Begin Dulera 2 puffs Twice daily , brush/rinse and after use.  Change Claritin to Zyrtec 10mg  At bedtime   Begin Flonase Nasal 2 puffs daily .  Delsym 2 tsp .Twice daily  As needed  Cough .  Follow up with Dr. Kendrick FriesMcQuaid in 6-8 weeks and As needed  [facility]- [place] office .

## 2018-06-20 NOTE — Assessment & Plan Note (Signed)
Mild persistent asthma with no perceived benefit from Arnuity and possibly side effect of cough.  It is possible the DPI was contributing to upper airway irritation.  He continues to have symptom burden.  We will change to MDI device.  We will switch to ICS /laba combination.  Asthma action plan discussed.  Advised on trigger prevention.  If continues to have symptoms will need to check labs with an allergy profile IgE and CBC with differential.  Plan  Patient Instructions  Stop Arnuity .  Begin Dulera 2 puffs Twice daily , brush/rinse and after use.  Change Claritin to Zyrtec 10mg  At bedtime   Begin Flonase Nasal 2 puffs daily .  Delsym 2 tsp .Twice daily  As needed  Cough .  Follow up with Dr. Kendrick FriesMcQuaid in 6-8 weeks and As needed  [facility]- [place] office .

## 2018-06-20 NOTE — Patient Instructions (Addendum)
Stop Arnuity .  Begin Dulera 2 puffs Twice daily , brush/rinse and after use.  Change Claritin to Zyrtec 10mg  At bedtime   Begin Flonase Nasal 2 puffs daily .  Delsym 2 tsp .Twice daily  As needed  Cough .  Follow up with Dr. Kendrick FriesMcQuaid in 6-8 weeks and As needed  [facility]- [place] office .

## 2018-06-20 NOTE — Progress Notes (Signed)
@Patient  ID: Derrick Carter, male    DOB: 05/02/1991, 27 y.o.   MRN: [id number]  Chief Complaint  Patient presents with  . Follow-up    Asthma    Referring provider: Silverio Decamp  HPI: 27 year old male never smoker seen in August for a pulmonary consult for dyspnea.  Thought to have a component of asthma  TEST/EVENTS :  May 2019 high-resolution images of his chest independently reviewed showing no pulmonary parenchymal abnormality, perhaps low lung volumes but otherwise normal in appearance.  No airway abnormality, no mediastinal abnormality.  PFT: May 2019 spirometry test from primary care: Ratio normal, forced vital capacity 4.1 L 73% predicted Aug 2019 Ratio 85% FEV1 3.81 L (90% pred), FVC 4.48L (88% pred), 18% change in small airways with bronchodilator; TLC 6.84L (108% pred), DLCO 31.34m 112% pred  Labs:  Path:  Echo: July 2019 transthoracic echocardiogram showed an LVEF of 60 to 65% and otherwise normal parameters  Heart Catheterization: June 2019 left heart catheterization showed no evidence of coronary artery disease, normal LVEF with mildly elevated left ventricular end-diastolic pressure  116/96/7893Follow up : Asthma  Patient returns for a 6-week follow-up.  Last visit patient was having persistent symptoms with cough and intermittent wheezing.  He was started on Arnuity.  Patient says he has not had significant improvement in symptoms.  He continues to have intermittent wheezing and uses his albuterol 2-3 times a day.  We discussed the potential side effects of overuse of albuterol.  Patient says that Arnuity seems to make him cough worse.  Patient's had an extensive work-up for shortness of breath.  With cardiac work-up again unrevealing.  Pulmonary function test showed some small airway reversibility and symptoms seem to be consistent with asthma.  Patient does get relief with albuterol.  He denies any chest pain orthopnea PND or increased leg  swelling.  Patient says he does have an occasional sinus drainage and stuffy nose. Accompanied by his mother  No Known Allergies  Immunization History  Administered Date(s) Administered  . DTaP 07/13/1992, 09/26/1992, 11/12/1992, 11/25/1993, 07/15/1996  . HPV Quadrivalent 04/13/2010, 10/21/2010, 09/06/2011  . Hepatitis A, Ped/Adol-2 Dose 01/24/2006, 08/21/2006  . Hepatitis B, ped/adol 108-02-1991 07/13/1992, 11/12/1992  . HiB (PRP-OMP) 07/13/1992, 09/21/1992, 11/12/1992, 08/15/1993  . IPV 07/13/1992, 09/21/1992, 11/25/1993, 07/15/1996  . Influenza Nasal 03/31/2011  . Influenza,Quad,Nasal, Live 04/16/2012  . Influenza,inj,Quad PF,6+ Mos 05/07/2013, 04/28/2014, 04/01/2015, 05/01/2016, 03/28/2018  . Influenza-Unspecified 05/20/2008, 04/22/2009, 04/13/2010  . MMR 08/15/1993, 07/15/1996  . Meningococcal Conjugate 01/24/2006, 04/13/2010  . Pneumococcal Polysaccharide-23 05/05/2014  . Td 05/19/2004, 02/11/2008  . Tdap 02/11/2008, 11/16/2015  . Varicella 05/12/1994    Past Medical History:  Diagnosis Date  . Acanthosis 08/24/2014  . ADHD (attention deficit hyperactivity disorder)   . Annual physical exam 04/28/2014  . Attention deficit hyperactivity disorder (ADHD) 02/04/2008   Overview:  ADHD, Combined Type  ICD-10 cut over   Last Assessment & Plan:  Relevant Hx: Course: Daily Update: Today's Plan: restart Strattera  . Callus of foot 08/07/2017  . Chest pain 10/30/2017  . Chronic myofascial pain 03/23/2017  . Chronic pain disorder 03/23/2017  . Dental crown present   . Diabetes mellitus [place] complication [place]   . Diabetes mellitus, type 2 ([place] 04/29/2014  . Hyperlipidemia   . Hypertriglyceridemia 11/19/2012  . Insomnia 03/06/2014  . Low back pain 08/19/2015  . Migraine headache 05/19/2013  . Obesity (BMI 30-39.9) 03/28/2013  . Patellofemoral syndrome, bilateral 08/07/2017  . Pilonidal cyst 03/2013  .  Pilonidal disease s/p I&D 03/10/2013 11/19/2012  . Restrictive pattern present on  pulmonary function testing 10/30/2017   Lung function test 11/22/2017 FVC 73% predicted FEV1 76% predicted, FEV1/FVC 104% predicted, no significant change postbronchodilator.  . Rhinitis 08/07/2017  . 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 Thoracic spondylosis without myelopathy 03/23/2017    Tobacco History: Social History   Tobacco Use  Smoking Status Never Smoker  Smokeless Tobacco Never Used   Counseling given: Not Answered   Outpatient Medications Prior to Visit  Medication Sig Dispense Refill  . albuterol (PROVENTIL HFA;VENTOLIN HFA) 108 (90 Base) MCG/ACT inhaler Inhale 2 puffs into the lungs every 6 (six) hours as needed for wheezing or shortness of breath. 1 Inhaler 2  . atorvastatin (LIPITOR) 40 MG tablet TAKE 1 TABLET BY MOUTH DAILY 90 tablet 3  . diphenhydrAMINE (BENADRYL) 50 MG tablet One tab 30 minutes before desired sleep time 30 tablet 11  . fenofibrate 160 MG tablet Take 1 tablet (160 mg total) by mouth daily. 90 tablet 3  . finasteride (PROPECIA) 1 MG tablet Take 1 tablet (1 mg total) by mouth daily. 90 tablet 3  . loratadine (CLARITIN) 10 MG tablet TAKE 1 TABLET BY MOUTH EVERY DAY 90 tablet 0  . Minoxidil 5 % FOAM Apply 1 application topically 2 (two) times daily. Apply twice daily for up to 4 months 1 Can 11  . sulfamethoxazole-trimethoprim (BACTRIM DS,SEPTRA DS) 800-160 MG tablet Take 1 tablet by mouth 2 (two) times daily for 14 days. 28 tablet 0  . XIGDUO XR 04-999 MG TB24 TAKE 1 TABLET BY MOUTH DAILY. 30 tablet 11  . Fluticasone Furoate (ARNUITY ELLIPTA) 100 MCG/ACT AEPB Inhale 1 puff into the lungs daily. 30 each 5   No facility-administered medications prior to visit.      Review of Systems  Constitutional:   No  weight loss, night sweats,  Fevers, chills,  +fatigue, or  lassitude.  HEENT:   No headaches,  Difficulty swallowing,  Tooth/dental problems, or  Sore throat,                No sneezing, itching, ear ache,  +nasal congestion, post nasal drip,    CV:  No chest pain,  Orthopnea, PND, swelling in lower extremities, anasarca, dizziness, palpitations, syncope.   GI  No heartburn, indigestion, abdominal pain, nausea, vomiting, diarrhea, change in bowel habits, loss of appetite, bloody stools.   Resp:   No chest wall deformity  Skin: no rash or lesions.  GU: no dysuria, change in color of urine, no urgency or frequency.  No flank pain, no hematuria   MS:  No joint pain or swelling.  No decreased range of motion.  No back pain.    Physical Exam  BP 135/88 (BP Location: Left Arm, Cuff Size: Normal)   Pulse 83   Ht 5' 11"  (1.803 m)   Wt 207 lb (93.9 kg)   SpO2 97%   BMI 28.87 kg/m   GEN: A/Ox3; pleasant , NAD   HEENT:  [place]/AT,  EACs-clear, TMs-wnl, NOSE-clear drainage tHROAT-clear, no lesions, no postnasal drip or exudate noted.   NECK:  Supple w/ fair ROM; no JVD; normal carotid impulses w/o bruits; no thyromegaly or nodules palpated; no lymphadenopathy.    RESP  Clear  P & A; w/o, wheezes/ rales/ or rhonchi. no accessory muscle use, no dullness to percussion  CARD:  RRR, no m/r/g, no peripheral edema, pulses intact, no cyanosis or clubbing.  GI:   Soft &  nt; nml bowel sounds; no organomegaly or masses detected.   Musco: Warm bil, no deformities or joint swelling noted.   Neuro: alert, no focal deficits noted.    Skin: Warm, no lesions or rashes    Lab Results:  CBC    Component Value Date/Time   WBC 8.5 12/13/2017 1140   WBC 12.8 (H) 10/30/2017 1118   RBC 5.22 12/13/2017 1140   RBC 5.40 10/30/2017 1118   HGB 15.9 12/13/2017 1140   HCT 48.7 12/13/2017 1140   PLT 322 12/13/2017 1140   MCV 93 12/13/2017 1140   MCH 30.5 12/13/2017 1140   MCH 30.7 10/30/2017 1118   MCHC 32.6 12/13/2017 1140   MCHC 33.9 10/30/2017 1118   RDW 12.8 12/13/2017 1140   LYMPHSABS 3.0 08/19/2015 1155   MONOABS 0.8 08/19/2015 1155   EOSABS 0.1 08/19/2015 1155   BASOSABS 0.0 08/19/2015 1155    BMET    Component Value  Date/Time   NA 139 01/02/2018 1113   K 4.0 01/02/2018 1113   CL 99 01/02/2018 1113   CO2 24 01/02/2018 1113   GLUCOSE 100 (H) 01/02/2018 1113   GLUCOSE 121 (H) 10/30/2017 1118   BUN 19 01/02/2018 1113   CREATININE 0.97 01/02/2018 1113   CREATININE 1.01 10/30/2017 1118   CALCIUM 9.8 01/02/2018 1113   GFRNONAA 108 01/02/2018 1113   GFRAA 125 01/02/2018 1113    BNP No results found for: BNP  ProBNP    Component Value Date/Time   PROBNP 25 01/02/2018 1113    Imaging: No results found.    PFT Results Latest Ref Rng & Units 02/05/2018  FVC-Pre L 4.56  FVC-Predicted Pre % 90  FVC-Post L 4.48  FVC-Predicted Post % 88  Pre FEV1/FVC % % 81  Post FEV1/FCV % % 85  FEV1-Pre L 3.68  FEV1-Predicted Pre % 87  FEV1-Post L 3.81  DLCO UNC% % 112  DLCO COR %Predicted % 120  TLC L 6.84  TLC % Predicted % 108  RV % Predicted % 126    No results found for: NITRICOXIDE None     Assessment & Plan:   Asthma Mild persistent asthma with no perceived benefit from Russell and possibly side effect of cough.  It is possible the DPI was contributing to upper airway irritation.  He continues to have symptom burden.  We will change to MDI device.  We will switch to ICS /laba combination.  Asthma action plan discussed.  Advised on trigger prevention.  If continues to have symptoms will need to check labs with an allergy profile IgE and CBC with differential.  Plan  Patient Instructions  Stop Arnuity .  Begin Dulera 2 puffs Twice daily , brush/rinse and after use.  Change Claritin to Zyrtec 8m At bedtime   Begin Flonase Nasal 2 puffs daily .  Delsym 2 tsp .Twice daily  As needed  Cough .  Follow up with Dr. MLake Bellsin 6-8 weeks and As needed  [facility]          Rhinitis Trigger prevention   Plan  Patient Instructions  Stop Arnuity .  Begin Dulera 2 puffs Twice daily , brush/rinse and after use.  Change Claritin to Zyrtec 180mAt bedtime   Begin Flonase Nasal 2  puffs daily .  Delsym 2 tsp .Twice daily  As needed  Cough .  Follow up with Dr. McLake Bellsn 6-8 weeks and As needed  [facility]  Rexene Edison, NP 06/20/2018

## 2018-06-21 ENCOUNTER — Other Ambulatory Visit: Payer: Self-pay | Admitting: Sports Medicine

## 2018-06-21 NOTE — Progress Notes (Signed)
Reviewed, agree 

## 2018-06-24 ENCOUNTER — Telehealth: Payer: Self-pay | Admitting: Pulmonary Disease

## 2018-06-24 NOTE — Telephone Encounter (Signed)
PA request received from: patient Drug requested: Dulera 100 CMM Key: [id number] PA request has been sent to plan, and a determination is expected within 1 day.   Preferred medications are Advair, Breo, and Symbicort per pharmacist.     Spoke with pt, aware of PA status.   Will continue to follow up on.

## 2018-06-25 NOTE — Telephone Encounter (Signed)
Received a determination letter from [facility]. Dulera 100-675mcg has been denied due to him not trying/failing the following: Advair Diskus, Advair HFA, Breo or Symbicort.   BQ, would you to appeal this decision or switch him to one of the mentioned above alternatives? Please advise. Thanks!

## 2018-06-27 NOTE — Telephone Encounter (Signed)
Pt's Dulera 100-685mcg is denied due to him not trying/failing the following: Advair Diskus, Advair HFA, Breo or Symbicort.  BQ is out of the office until 07/09/2018. Sending message to AOD- Waynetta SandyBeth W to advise  Does an appeal need to be completed or can the patient be switched to one of the above inhalers? Beth, please advise. Thank you.

## 2018-06-27 NOTE — Telephone Encounter (Signed)
Change to Symbicort 80 - two puffs twice a day

## 2018-06-28 NOTE — Telephone Encounter (Signed)
lmom X1 

## 2018-07-01 MED ORDER — BUDESONIDE-FORMOTEROL FUMARATE 80-4.5 MCG/ACT IN AERO: 2.0000 | INHALATION_SPRAY | Freq: Two times a day (BID) | RESPIRATORY_TRACT | 5 refills | Status: DC

## 2018-07-01 NOTE — Telephone Encounter (Signed)
Called and left message for Patient to call back about his inhaler change.

## 2018-07-01 NOTE — Telephone Encounter (Signed)
Pt is calling back [phone]

## 2018-07-01 NOTE — Telephone Encounter (Signed)
Spoke with pt, aware of recs.  rx sent to preferred pharmacy.  Nothing further needed.  

## 2018-07-04 ENCOUNTER — Ambulatory Visit: Payer: BC Managed Care – PPO | Admitting: Sports Medicine

## 2018-07-04 ENCOUNTER — Encounter: Payer: Self-pay | Admitting: Sports Medicine

## 2018-07-04 DIAGNOSIS — L649 Androgenic alopecia, unspecified: Secondary | ICD-10-CM | POA: Diagnosis not present

## 2018-07-04 DIAGNOSIS — L0232 Furuncle of buttock: Secondary | ICD-10-CM | POA: Diagnosis not present

## 2018-07-04 DIAGNOSIS — E782 Mixed hyperlipidemia: Secondary | ICD-10-CM

## 2018-07-04 DIAGNOSIS — E119 Type 2 diabetes mellitus without complications: Secondary | ICD-10-CM | POA: Diagnosis not present

## 2018-07-04 MED ORDER — MINOXIDIL 2 % EX SOLN: Freq: Two times a day (BID) | CUTANEOUS | 11 refills | Status: DC

## 2018-07-04 NOTE — Progress Notes (Addendum)
Subjective:    CC: Follow-up  HPI: Buttock furuncle: Resolved now with doxycycline.  Hypertension: Controlled.  Hyperlipidemia: Controlled, due to recheck lipids.  Diabetes mellitus type 2: Due to check A1c.  Well controlled historically.  Male pattern baldness: Was not able to tolerate full dose minoxidil.  I reviewed the past medical history, family history, social history, surgical history, and allergies today and no changes were needed.  Please see the problem list section below in epic for further details.  Past Medical History: Past Medical History:  Diagnosis Date  . Acanthosis 08/24/2014  . ADHD (attention deficit hyperactivity disorder)   . Annual physical exam 04/28/2014  . Attention deficit hyperactivity disorder (ADHD) 02/04/2008   Overview:  ADHD, Combined Type  ICD-10 cut over   Last Assessment & Plan:  Relevant Hx: Course: Daily Update: Today's Plan: restart Strattera  . Callus of foot 08/07/2017  . Chest pain 10/30/2017  . Chronic myofascial pain 03/23/2017  . Chronic pain disorder 03/23/2017  . Dental crown present   . Diabetes mellitus without complication (HCC)   . Diabetes mellitus, type 2 (HCC) 04/29/2014  . Hyperlipidemia   . Hypertriglyceridemia 11/19/2012  . Insomnia 03/06/2014  . Low back pain 08/19/2015  . Migraine headache 05/19/2013  . Obesity (BMI 30-39.9) 03/28/2013  . Patellofemoral syndrome, bilateral 08/07/2017  . Pilonidal cyst 03/2013  . Pilonidal disease s/p I&D 03/10/2013 11/19/2012  . 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  . Rhinitis 08/07/2017  . 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 Thoracic spondylosis without myelopathy 03/23/2017   Past Surgical History: Past Surgical History:  Procedure Laterality Date  . LEFT HEART CATH AND CORONARY ANGIOGRAPHY N/A 12/24/2017   Procedure: LEFT  HEART CATH AND CORONARY ANGIOGRAPHY;  Surgeon: Yvonne KendallEnd, Christopher, MD;  Location: [facility];  Service: Cardiovascular;  Laterality: N/A;  . NO PAST SURGERIES    . PILONIDAL CYST EXCISION N/A 03/10/2013   Procedure: CYST EXCISION PILONIDAL SIMPLE I and D ;  Surgeon: Robyne AskewPaul S Toth III, MD;  Location: [facility];  Service: General;  Laterality: N/A;   Social History: Social History   Socioeconomic History  . Marital status: Single    Spouse name: Not on file  . Number of children: Not on file  . Years of education: Not on file  . Highest education level: Not on file  Occupational History  . Not on file  Social Needs  . Financial resource strain: Not on file  . Food insecurity:    Worry: Not on file    Inability: Not on file  . Transportation needs:    Medical: Not on file    Non-medical: Not on file  Tobacco Use  . Smoking status: Never Smoker  . Smokeless tobacco: Never Used  Substance and Sexual Activity  . Alcohol use: No  . Drug use: No  . Sexual activity: Never  Lifestyle  . Physical activity:    Days per week: Not on file    Minutes per session: Not on file  . Stress: Not on file  Relationships  . Social connections:    Talks on phone: Not on file    Gets together: Not on file    Attends religious service: Not on file    Active member of club or organization: Not on file    Attends meetings of clubs or organizations: Not  on file    Relationship status: Not on file  Other Topics Concern  . Not on file  Social History Narrative  . Not on file   Family History: Family History  Problem Relation Age of Onset  . Hyperlipidemia Mother   . Heart disease Mother   . Diabetes Mother   . Hypertension Mother   . Diabetes Maternal Grandmother   . Hyperlipidemia Maternal Grandmother   . Hyperlipidemia Maternal Grandfather    Allergies: No Known Allergies Medications: See med rec.  Review of Systems: No fevers, chills, night sweats, weight loss,  chest pain, or shortness of breath.   Objective:    General: Well Developed, well nourished, and in no acute distress.  Neuro: Alert and oriented x3, extra-ocular muscles intact, sensation grossly intact.  HEENT: Normocephalic, atraumatic, pupils equal round reactive to light, neck supple, no masses, no lymphadenopathy, thyroid nonpalpable.  Skin: Warm and dry, no rashes. Cardiac: Regular rate and rhythm, no murmurs rubs or gallops, no lower extremity edema.  Respiratory: Clear to auscultation bilaterally. Not using accessory muscles, speaking in full sentences.  Impression and Recommendations:    Furuncle of buttock Completely resolved with antibiotics.  Hyperlipidemia Rechecking lipids.  Diabetes mellitus, type 2 Rechecking hemoglobin A1c.  Hemoglobin A1c is bad.  Adding Ozempic.  Male pattern baldness Doing well with finasteride, he did get some headache with Rogaine, switching to a lower potency. ___________________________________________ Ihor Austin. Benjamin Stain, M.D., ABFM., CAQSM. Primary Care and Sports Medicine [facility]  Adjunct Professor of Family Medicine  [employer]

## 2018-07-04 NOTE — Assessment & Plan Note (Addendum)
Rechecking hemoglobin A1c.  Hemoglobin A1c is bad.  Adding Ozempic.

## 2018-07-04 NOTE — Assessment & Plan Note (Signed)
Rechecking lipids. 

## 2018-07-04 NOTE — Assessment & Plan Note (Signed)
Doing well with finasteride, he did get some headache with Rogaine, switching to a lower potency.

## 2018-07-04 NOTE — Assessment & Plan Note (Signed)
Completely resolved with antibiotics.

## 2018-07-05 LAB — COMPREHENSIVE METABOLIC PANEL
AG Ratio: 1.5 (calc) (ref 1.0–2.5)
ALT: 48 U/L — ABNORMAL HIGH (ref 9–46)
AST: 30 U/L (ref 10–40)
Albumin: 4.7 g/dL (ref 3.6–5.1)
Alkaline phosphatase (APISO): 48 U/L (ref 40–115)
CO2: 26 mmol/L (ref 20–32)
Calcium: 10.3 mg/dL (ref 8.6–10.3)
Chloride: 101 mmol/L (ref 98–110)
Creat: 0.96 mg/dL (ref 0.60–1.35)
Globulin: 3.2 g/dL (calc) (ref 1.9–3.7)
Glucose, Bld: 223 mg/dL — ABNORMAL HIGH (ref 65–99)
Potassium: 4.1 mmol/L (ref 3.5–5.3)
Sodium: 138 mmol/L (ref 135–146)
Total Bilirubin: 0.8 mg/dL (ref 0.2–1.2)
Total Protein: 7.9 g/dL (ref 6.1–8.1)

## 2018-07-05 LAB — TSH: TSH: 2.99 mIU/L (ref 0.40–4.50)

## 2018-07-05 LAB — LIPID PANEL W/REFLEX DIRECT LDL
Cholesterol: 214 mg/dL — ABNORMAL HIGH (ref <200)
HDL: 36 mg/dL — ABNORMAL LOW (ref >40)
LDL Cholesterol (Calc): 145 mg/dL — ABNORMAL HIGH
Non-HDL Cholesterol (Calc): 178 mg/dL (calc) — ABNORMAL HIGH (ref <130)
Total CHOL/HDL Ratio: 5.9 (calc) — ABNORMAL HIGH (ref <5.0)
Triglycerides: 194 mg/dL — ABNORMAL HIGH (ref <150)

## 2018-07-05 LAB — CBC
HCT: 48.3 % (ref 38.5–50.0)
Hemoglobin: 16.9 g/dL (ref 13.2–17.1)
MCH: 31.7 pg (ref 27.0–33.0)
MCHC: 35 g/dL (ref 32.0–36.0)
MCV: 90.6 fL (ref 80.0–100.0)
MPV: 10.6 fL (ref 7.5–12.5)
Platelets: 356 10*3/uL (ref 140–400)
RBC: 5.33 Million/uL (ref 4.20–5.80)
RDW: 12.1 % (ref 11.0–15.0)
WBC: 9.5 Thousand/uL (ref 3.8–10.8)

## 2018-07-05 LAB — COMPREHENSIVE METABOLIC PANEL WITH GFR: BUN: 16 mg/dL (ref 7–25)

## 2018-07-05 LAB — HEMOGLOBIN A1C
Hgb A1c MFr Bld: 10.3 % of total Hgb — ABNORMAL HIGH (ref <5.7)
Mean Plasma Glucose: 249 (calc)
eAG (mmol/L): 13.8 (calc)

## 2018-07-05 MED ORDER — SEMAGLUTIDE (1 MG/DOSE) 2 MG/1.5ML ~~LOC~~ SOPN: 1.0000 mg | PEN_INJECTOR | SUBCUTANEOUS | 11 refills | Status: DC

## 2018-07-05 MED ORDER — SEMAGLUTIDE(0.25 OR 0.5MG/DOS) 2 MG/1.5ML ~~LOC~~ SOPN: 0.5000 mg | PEN_INJECTOR | SUBCUTANEOUS | 11 refills | Status: DC

## 2018-07-05 NOTE — Addendum Note (Signed)
Addended by: Monica BectonHEKKEKANDAM, Manasvini Whatley J on: 07/05/2018 09:20 AM   Modules accepted: Orders

## 2018-07-16 ENCOUNTER — Other Ambulatory Visit (HOSPITAL_COMMUNITY): Payer: Self-pay | Admitting: Internal Medicine

## 2018-07-16 NOTE — Telephone Encounter (Signed)
Refill Request.  

## 2018-08-06 ENCOUNTER — Encounter: Payer: Self-pay | Admitting: Pulmonary Disease

## 2018-08-06 ENCOUNTER — Telehealth: Payer: Self-pay | Admitting: Pulmonary Disease

## 2018-08-06 ENCOUNTER — Ambulatory Visit: Payer: BC Managed Care – PPO | Admitting: Sports Medicine

## 2018-08-06 ENCOUNTER — Ambulatory Visit: Payer: BC Managed Care – PPO | Admitting: Pulmonary Disease

## 2018-08-06 ENCOUNTER — Encounter: Payer: Self-pay | Admitting: Sports Medicine

## 2018-08-06 VITALS — BP 126/68 | HR 102 | Ht 71.0 in | Wt 206.0 lb

## 2018-08-06 DIAGNOSIS — J0101 Acute recurrent maxillary sinusitis: Secondary | ICD-10-CM | POA: Diagnosis not present

## 2018-08-06 DIAGNOSIS — J4530 Mild persistent asthma, uncomplicated: Secondary | ICD-10-CM | POA: Diagnosis not present

## 2018-08-06 DIAGNOSIS — J4520 Mild intermittent asthma, uncomplicated: Secondary | ICD-10-CM

## 2018-08-06 MED ORDER — AMOXICILLIN-POT CLAVULANATE 875-125 MG PO TABS: 1.0000 | ORAL_TABLET | Freq: Two times a day (BID) | ORAL | 0 refills | Status: AC

## 2018-08-06 MED ORDER — ALBUTEROL SULFATE (2.5 MG/3ML) 0.083% IN NEBU: 2.5000 mg | INHALATION_SOLUTION | Freq: Four times a day (QID) | RESPIRATORY_TRACT | 5 refills | Status: DC | PRN

## 2018-08-06 MED ORDER — MOMETASONE FURO-FORMOTEROL FUM 100-5 MCG/ACT IN AERO: 2.0000 | INHALATION_SPRAY | Freq: Two times a day (BID) | RESPIRATORY_TRACT | 5 refills | Status: DC

## 2018-08-06 MED ORDER — FLUTICASONE PROPIONATE 50 MCG/ACT NA SUSP: NASAL | 3 refills | Status: DC

## 2018-08-06 MED ORDER — TRAMADOL HCL 50 MG PO TABS: 50.0000 mg | ORAL_TABLET | Freq: Three times a day (TID) | ORAL | 0 refills | Status: DC | PRN

## 2018-08-06 NOTE — Patient Instructions (Signed)
Moderate persistent asthma with worsening symptoms: Use the albuterol nebulizer every 4-6 hours as needed for shortness of breath, we will prescribe this today Start taking Dulera 2 puffs twice a day, we will write an appeal letter to your insurance company Stop Symbicort Practice good hand hygiene Stay active  We will see you back in 4 to 6 months or sooner if needed

## 2018-08-06 NOTE — Progress Notes (Signed)
Synopsis: Referred in Aug 2019 for dyspnea; Cardiac cath normal, lung function testing with minor improvement in airflow in small airways after bronchodilator.   Subjective:   PATIENT ID: Derrick Carter GENDER: male DOB: Dec 28, 1990, MRN: [id number]   HPI  Chief Complaint  Patient presents with  . Follow-up    pt not doing well on symbicort- wants to switch back to dulera.  using albuterol 2-3X daily.     Derrick Carter returns to see me today because he says his asthma symptoms have worsened in the last few weeks.  His insurance company no longer paid for [facility] and he says ever since that he switched to Symbicort he feels as if he is not taking any medicine at all.  Specifically what he means is that he has had more chest tightness shortness of breath recently.  He says he is having to use albuterol more frequently and even the effectiveness of this has been decreased.  He has not been sick with bronchitis or pneumonia.  He is not started smoking.  Past Medical History:  Diagnosis Date  . Acanthosis 08/24/2014  . ADHD (attention deficit hyperactivity disorder)   . Annual physical exam 04/28/2014  . Attention deficit hyperactivity disorder (ADHD) 02/04/2008   Overview:  ADHD, Combined Type  ICD-10 cut over   Last Assessment & Plan:  Relevant Hx: Course: Daily Update: Today's Plan: restart Strattera  . Callus of foot 08/07/2017  . Chest pain 10/30/2017  . Chronic myofascial pain 03/23/2017  . Chronic pain disorder 03/23/2017  . Dental crown present   . Diabetes mellitus without complication (HCC)   . Diabetes mellitus, type 2 (HCC) 04/29/2014  . Hyperlipidemia   . Hypertriglyceridemia 11/19/2012  . Insomnia 03/06/2014  . Low back pain 08/19/2015  . Migraine headache 05/19/2013  . Obesity (BMI 30-39.9) 03/28/2013  . Patellofemoral syndrome, bilateral 08/07/2017  . Pilonidal cyst 03/2013  . Pilonidal disease s/p I&D 03/10/2013 11/19/2012  . Restrictive pattern present on pulmonary function testing  10/30/2017   Lung function test 11/22/2017 FVC 73% predicted FEV1 76% predicted, FEV1/FVC 104% predicted, no significant change postbronchodilator.  . Rhinitis 08/07/2017  . 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 Thoracic spondylosis without myelopathy 03/23/2017     Review of Systems  Constitutional: Negative.  Negative for chills, fever, malaise/fatigue and weight loss.  HENT: Positive for congestion. Negative for nosebleeds, sinus pain and sore throat.   Eyes: Negative.  Negative for photophobia, pain and discharge.  Respiratory: Positive for cough, shortness of breath and wheezing. Negative for hemoptysis and sputum production.   Cardiovascular: Negative.  Negative for chest pain, palpitations, orthopnea and leg swelling.  Gastrointestinal: Negative.  Negative for abdominal pain, constipation, diarrhea, nausea and vomiting.  Genitourinary: Negative.  Negative for dysuria, frequency, hematuria and urgency.  Musculoskeletal: Negative.  Negative for back pain, joint pain, myalgias and neck pain.  Skin: Negative.  Negative for itching and rash.  Neurological: Negative.  Negative for tingling, tremors, sensory change, speech change, focal weakness, seizures, weakness and headaches.  Endo/Heme/Allergies: Negative.   Psychiatric/Behavioral: Negative.  Negative for memory loss, substance abuse and suicidal ideas. The patient is not nervous/anxious.       Objective:  Physical Exam   Vitals:   08/06/18 1459  BP: 126/68  Pulse: (!) 102  SpO2: 97%  Weight: 206 lb (93.4 kg)  Height: 5\' 11"  (1.803 m)   Body mass index is 28.73 kg/m.   RA  Gen: well appearing HENT: OP clear, TM's clear,  neck supple PULM: CTA B, normal percussion CV: RRR, no mgr, trace edema GI: BS+, soft, nontender Derm: no cyanosis or rash Psyche: normal mood and affect    CBC    Component Value Date/Time   WBC 9.5 07/04/2018 1342   RBC 5.33 07/04/2018 1342   HGB 16.9 07/04/2018 1342   HGB 15.9 12/13/2017  1140   HCT 48.3 07/04/2018 1342   HCT 48.7 12/13/2017 1140   PLT 356 07/04/2018 1342   PLT 322 12/13/2017 1140   MCV 90.6 07/04/2018 1342   MCV 93 12/13/2017 1140   MCH 31.7 07/04/2018 1342   MCHC 35.0 07/04/2018 1342   RDW 12.1 07/04/2018 1342   RDW 12.8 12/13/2017 1140   LYMPHSABS 3.0 08/19/2015 1155   MONOABS 0.8 08/19/2015 1155   EOSABS 0.1 08/19/2015 1155   BASOSABS 0.0 08/19/2015 1155     Chest imaging: May 2019 high-resolution images of his chest independently reviewed showing no pulmonary parenchymal abnormality, perhaps low lung volumes but otherwise normal in appearance.  No airway abnormality, no mediastinal abnormality.  PFT: May 2019 spirometry test from primary care: Ratio normal, forced vital capacity 4.1 L 73% predicted Aug 2019 Ratio 85% FEV1 3.81 L (90% pred), FVC 4.48L (88% pred), 18% change in small airways with bronchodilator; TLC 6.84L (108% pred), DLCO 31.508mL 112% pred  Labs:  Path:  Echo: July 2019 transthoracic echocardiogram showed an LVEF of 60 to 65% and otherwise normal parameters  Heart Catheterization: June 2019 left heart catheterization showed no evidence of coronary artery disease, normal LVEF with mildly elevated left ventricular end-diastolic pressure      Assessment & Plan:   Mild intermittent asthma, unspecified whether complicated - Plan: Ambulatory Referral for DME  Mild persistent allergic asthma  Discussion: [name]'s asthma control has worsened since he was switched to Symbicort.  He has been taking the medicine regularly for a month now and using it appropriately but still has worsening asthma control.  I feel that [facility] as this provided better control for his moderate persistent asthma.  Plan: Moderate persistent asthma with worsening symptoms: Use the albuterol nebulizer every 4-6 hours as needed for shortness of breath, we will prescribe this today Start taking Dulera 2 puffs twice a day, we  will write an appeal letter to your insurance company Stop Symbicort Practice good hand hygiene Stay active  We will see you back in 4 to 6 months or sooner if needed     Current Outpatient Medications:  .  albuterol (PROVENTIL HFA;VENTOLIN HFA) 108 (90 Base) MCG/ACT inhaler, Inhale 2 puffs into the lungs every 6 (six) hours as needed for wheezing or shortness of breath., Disp: 1 Inhaler, Rfl: 2 .  amoxicillin-clavulanate (AUGMENTIN) 875-125 MG tablet, Take 1 tablet by mouth 2 (two) times daily for 10 days., Disp: 20 tablet, Rfl: 0 .  atorvastatin (LIPITOR) 40 MG tablet, TAKE 1 TABLET BY MOUTH DAILY, Disp: 90 tablet, Rfl: 3 .  budesonide-formoterol (SYMBICORT) 80-4.5 MCG/ACT inhaler, Inhale 2 puffs into the lungs 2 (two) times daily., Disp: 1 Inhaler, Rfl: 5 .  diphenhydrAMINE (BENADRYL) 50 MG tablet, One tab 30 minutes before desired sleep time, Disp: 30 tablet, Rfl: 11 .  fenofibrate 160 MG tablet, Take 1 tablet (160 mg total) by mouth daily., Disp: 90 tablet, Rfl: 3 .  finasteride (PROPECIA) 1 MG tablet, Take 1 tablet (1 mg total) by mouth daily., Disp: 90 tablet, Rfl: 3 .  fluticasone (FLONASE) 50 MCG/ACT nasal spray, One  spray in each nostril twice a day, use left hand for right nostril, and right hand for left nostril., Disp: 48 g, Rfl: 3 .  loratadine (CLARITIN) 10 MG tablet, TAKE 1 TABLET BY MOUTH EVERY DAY, Disp: 90 tablet, Rfl: 0 .  minoxidil (MINOXIDIL FOR MEN) 2 % external solution, Apply topically 2 (two) times daily., Disp: 60 mL, Rfl: 11 .  Semaglutide, 1 MG/DOSE, (OZEMPIC, 1 MG/DOSE,) 2 MG/1.5ML SOPN, Inject 1 mg into the skin once a week., Disp: 4 pen, Rfl: 11 .  Semaglutide,0.25 or 0.5MG /DOS, (OZEMPIC, 0.25 OR 0.5 MG/DOSE,) 2 MG/1.5ML SOPN, Inject 0.5 mg into the skin once a week. Inject 0.25 mg subcutaneous weekly for 4 weeks then inject 0.5 mg subcutaneous weekly, Disp: 4 pen, Rfl: 11 .  traMADol (ULTRAM) 50 MG tablet, Take 1 tablet (50 mg total) by mouth every 8 (eight)  hours as needed for moderate pain. Maximum 6 tabs per day., Disp: 21 tablet, Rfl: 0 .  XIGDUO XR 04-999 MG TB24, TAKE 1 TABLET BY MOUTH DAILY., Disp: 30 tablet, Rfl: 3 .  albuterol (PROVENTIL) (2.5 MG/3ML) 0.083% nebulizer solution, Take 3 mLs (2.5 mg total) by nebulization every 6 (six) hours as needed for wheezing or shortness of breath., Disp: 360 mL, Rfl: 5 .  mometasone-formoterol (DULERA) 100-5 MCG/ACT AERO, Inhale 2 puffs into the lungs 2 (two) times daily., Disp: 1 Inhaler, Rfl: 5

## 2018-08-06 NOTE — Progress Notes (Signed)
Subjective:    CC: Feeling sick  HPI: For the past week this 28 year old male has had increasing facial pain and pressure with nasal discharge.  Nonpurulent.  Mild cough, no fevers, chills, muscle aches, body aches.  No shortness of breath, no chest pain.  Was on moderate, worsening.  Radiation of the facial pain to both ears.  He also has some increasing pain in his ankles and would like short course of tramadol.  I reviewed the past medical history, family history, social history, surgical history, and allergies today and no changes were needed.  Please see the problem list section below in epic for further details.  Past Medical History: Past Medical History:  Diagnosis Date  . Acanthosis 08/24/2014  . ADHD (attention deficit hyperactivity disorder)   . Annual physical exam 04/28/2014  . Attention deficit hyperactivity disorder (ADHD) 02/04/2008   Overview:  ADHD, Combined Type  ICD-10 cut over   Last Assessment & Plan:  Relevant Hx: Course: Daily Update: Today's Plan: restart Strattera  . Callus of foot 08/07/2017  . Chest pain 10/30/2017  . Chronic myofascial pain 03/23/2017  . Chronic pain disorder 03/23/2017  . Dental crown present   . Diabetes mellitus without complication (HCC)   . Diabetes mellitus, type 2 (HCC) 04/29/2014  . Hyperlipidemia   . Hypertriglyceridemia 11/19/2012  . Insomnia 03/06/2014  . Low back pain 08/19/2015  . Migraine headache 05/19/2013  . Obesity (BMI 30-39.9) 03/28/2013  . Patellofemoral syndrome, bilateral 08/07/2017  . Pilonidal cyst 03/2013  . Pilonidal disease s/p I&D 03/10/2013 11/19/2012  . 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  . Rhinitis 08/07/2017  . 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 Thoracic spondylosis without myelopathy 03/23/2017   Past Surgical History: Past Surgical History:    Procedure Laterality Date  . LEFT HEART CATH AND CORONARY ANGIOGRAPHY N/A 12/24/2017   Procedure: LEFT HEART CATH AND CORONARY ANGIOGRAPHY;  Surgeon: Yvonne KendallEnd, Christopher, MD;  Location: [facility];  Service: Cardiovascular;  Laterality: N/A;  . NO PAST SURGERIES    . PILONIDAL CYST EXCISION N/A 03/10/2013   Procedure: CYST EXCISION PILONIDAL SIMPLE I and D ;  Surgeon: Robyne AskewPaul S Toth III, MD;  Location: [facility];  Service: General;  Laterality: N/A;   Social History: Social History   Socioeconomic History  . Marital status: Single    Spouse name: Not on file  . Number of children: Not on file  . Years of education: Not on file  . Highest education level: Not on file  Occupational History  . Not on file  Social Needs  . Financial resource strain: Not on file  . Food insecurity:    Worry: Not on file    Inability: Not on file  . Transportation needs:    Medical: Not on file    Non-medical: Not on file  Tobacco Use  . Smoking status: Never Smoker  . Smokeless tobacco: Never Used  Substance and Sexual Activity  . Alcohol use: No  . Drug use: No  . Sexual activity: Never  Lifestyle  . Physical activity:    Days per week: Not on file    Minutes per session: Not on file  . Stress: Not on file  Relationships  . Social connections:    Talks on phone: Not on file    Gets together: Not on file  Attends religious service: Not on file    Active member of club or organization: Not on file    Attends meetings of clubs or organizations: Not on file    Relationship status: Not on file  Other Topics Concern  . Not on file  Social History Narrative  . Not on file   Family History: Family History  Problem Relation Age of Onset  . Hyperlipidemia Mother   . Heart disease Mother   . Diabetes Mother   . Hypertension Mother   . Diabetes Maternal Grandmother   . Hyperlipidemia Maternal Grandmother   . Hyperlipidemia Maternal Grandfather    Allergies: No Known  Allergies Medications: See med rec.  Review of Systems: No fevers, chills, night sweats, weight loss, chest pain, or shortness of breath.   Objective:    General: Well Developed, well nourished, and in no acute distress.  Neuro: Alert and oriented x3, extra-ocular muscles intact, sensation grossly intact.  HEENT: Normocephalic, atraumatic, pupils equal round reactive to light, neck supple, no masses, no lymphadenopathy, thyroid nonpalpable.  Oropharynx, nasopharynx, ear canals unremarkable.  Normal tenderness over the maxillary sinuses. Skin: Warm and dry, no rashes. Cardiac: Regular rate and rhythm, no murmurs rubs or gallops, no lower extremity edema.  Respiratory: Clear to auscultation bilaterally. Not using accessory muscles, speaking in full sentences.  Impression and Recommendations:    Acute maxillary sinusitis Augmentin, Flonase. Return as needed.  ___________________________________________ Ihor Austinhomas J. Benjamin Stainhekkekandam, M.D., ABFM., CAQSM. Primary Care and Sports Medicine [facility]  Adjunct Professor of Family Medicine  [employer]

## 2018-08-06 NOTE — Telephone Encounter (Signed)
PA initiated per today's OV note.  Drug requested: Dulera 100 CMM Key: [id number]  PA request has been sent to plan, and a determination is expected within 3 days.   Routing to myself for follow-up.

## 2018-08-06 NOTE — Assessment & Plan Note (Signed)
Augmentin, Flonase. Return as needed. 

## 2018-08-25 ENCOUNTER — Other Ambulatory Visit: Payer: Self-pay | Admitting: Sports Medicine

## 2018-09-03 ENCOUNTER — Other Ambulatory Visit: Payer: Self-pay | Admitting: Sports Medicine

## 2018-09-03 ENCOUNTER — Ambulatory Visit: Payer: BC Managed Care – PPO | Admitting: Sports Medicine

## 2018-09-03 ENCOUNTER — Encounter: Payer: Self-pay | Admitting: Sports Medicine

## 2018-09-03 ENCOUNTER — Telehealth: Payer: Self-pay | Admitting: Pulmonary Disease

## 2018-09-03 DIAGNOSIS — S83207D Unspecified tear of unspecified meniscus, current injury, left knee, subsequent encounter: Secondary | ICD-10-CM

## 2018-09-03 DIAGNOSIS — E119 Type 2 diabetes mellitus without complications: Secondary | ICD-10-CM | POA: Diagnosis not present

## 2018-09-03 DIAGNOSIS — E669 Obesity, unspecified: Secondary | ICD-10-CM

## 2018-09-03 MED ORDER — TRAMADOL HCL 50 MG PO TABS: 50.0000 mg | ORAL_TABLET | Freq: Every day | ORAL | 0 refills | Status: DC | PRN

## 2018-09-03 NOTE — Telephone Encounter (Signed)
Key # [id number] listed with covermymeds as  "Cancelled case state policy.Marland KitchenMarland KitchenCyndi Lennert."  PA was entered under [employer].  Called [facility] on hold over 10 minutes waiting for pharmacy to pick up to confirm if medication was approved by insurance.  Had to end call and tried again. Spoke to pharmacy who stated they did not get any notification from [facility] and the prescription still shows cost of $314.87.  Called the patient and he stated he had to pay the cost of the prescription while he was on the phone with his insurance company. Directed the patient to the dulera website to access prescription savings cards. Also advised the patient to contact his insurance company and ask for a copy of the formulary list and contact our office with the alternatives so we can forward a message to doctor to see if one of them will be appropriate for him to use and prescription can be sent.  Patient voiced understanding. Nothing further needed at this time.

## 2018-09-03 NOTE — Assessment & Plan Note (Signed)
Overall doing well with 1 tramadol daily, I will call in 3 months worth.

## 2018-09-03 NOTE — Assessment & Plan Note (Signed)
We tried to get him to take Ozempic but he never did it.  His A1c was 10, 2 months ago. He will return in 1 month for a hemoglobin A1c and if still elevated I will push him to do the Ozempic.

## 2018-09-03 NOTE — Telephone Encounter (Signed)
Yes, I had meant to do that.  I sent it in now.

## 2018-09-03 NOTE — Telephone Encounter (Signed)
Routing to PCP for review. Do see where he was seen today but no new Rx sent.

## 2018-09-03 NOTE — Telephone Encounter (Signed)
Pt called checking status of Tramadol refill. Confirmed script was last written 08/06/18 & today is 09/03/18 so it may be tomorrow before it can be refilled. Pt will check his pharmacy tomorrow.

## 2018-09-03 NOTE — Progress Notes (Signed)
Subjective:    CC: Multiple issues  HPI: Chronic pain: Stable on tramadol.  Diabetes mellitus type 2: A1c was 10 approximately 2 months ago.  I started Ozempic but he never used it.  His mother felt as though he did not need it.  Weight gain: Looking back in his chart he is actually losing weight, agreeable to visit the nutritionist.  If insufficient improvement by the follow-up visit they do agree to take Ozempic.  I reviewed the past medical history, family history, social history, surgical history, and allergies today and no changes were needed.  Please see the problem list section below in epic for further details.  Past Medical History: Past Medical History:  Diagnosis Date  . Acanthosis 08/24/2014  . ADHD (attention deficit hyperactivity disorder)   . Annual physical exam 04/28/2014  . Attention deficit hyperactivity disorder (ADHD) 02/04/2008   Overview:  ADHD, Combined Type  ICD-10 cut over   Last Assessment & Plan:  Relevant Hx: Course: Daily Update: Today's Plan: restart Strattera  . Callus of foot 08/07/2017  . Chest pain 10/30/2017  . Chronic myofascial pain 03/23/2017  . Chronic pain disorder 03/23/2017  . Dental crown present   . Diabetes mellitus without complication (HCC)   . Diabetes mellitus, type 2 (HCC) 04/29/2014  . Hyperlipidemia   . Hypertriglyceridemia 11/19/2012  . Insomnia 03/06/2014  . Low back pain 08/19/2015  . Migraine headache 05/19/2013  . Obesity (BMI 30-39.9) 03/28/2013  . Patellofemoral syndrome, bilateral 08/07/2017  . Pilonidal cyst 03/2013  . Pilonidal disease s/p I&D 03/10/2013 11/19/2012  . 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  . Rhinitis 08/07/2017  . 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 Thoracic spondylosis without myelopathy 03/23/2017   Past Surgical History: Past Surgical History:    Procedure Laterality Date  . LEFT HEART CATH AND CORONARY ANGIOGRAPHY N/A 12/24/2017   Procedure: LEFT HEART CATH AND CORONARY ANGIOGRAPHY;  Surgeon: Yvonne Kendall, MD;  Location: [facility];  Service: Cardiovascular;  Laterality: N/A;  . NO PAST SURGERIES    . PILONIDAL CYST EXCISION N/A 03/10/2013   Procedure: CYST EXCISION PILONIDAL SIMPLE I and D ;  Surgeon: Robyne Askew, MD;  Location: [facility];  Service: General;  Laterality: N/A;   Social History: Social History   Socioeconomic History  . Marital status: Single    Spouse name: Not on file  . Number of children: Not on file  . Years of education: Not on file  . Highest education level: Not on file  Occupational History  . Not on file  Social Needs  . Financial resource strain: Not on file  . Food insecurity:    Worry: Not on file    Inability: Not on file  . Transportation needs:    Medical: Not on file    Non-medical: Not on file  Tobacco Use  . Smoking status: Never Smoker  . Smokeless tobacco: Never Used  Substance and Sexual Activity  . Alcohol use: No  . Drug use: No  . Sexual activity: Never  Lifestyle  . Physical activity:    Days per week: Not on file    Minutes per session: Not on file  . Stress: Not on file  Relationships  . Social connections:    Talks on phone: Not on file    Gets together: Not on file  Attends religious service: Not on file    Active member of club or organization: Not on file    Attends meetings of clubs or organizations: Not on file    Relationship status: Not on file  Other Topics Concern  . Not on file  Social History Narrative  . Not on file   Family History: Family History  Problem Relation Age of Onset  . Hyperlipidemia Mother   . Heart disease Mother   . Diabetes Mother   . Hypertension Mother   . Diabetes Maternal Grandmother   . Hyperlipidemia Maternal Grandmother   . Hyperlipidemia Maternal Grandfather    Allergies: No Known  Allergies Medications: See med rec.  Review of Systems: No fevers, chills, night sweats, weight loss, chest pain, or shortness of breath.   Objective:    General: Well Developed, well nourished, and in no acute distress.  Neuro: Alert and oriented x3, extra-ocular muscles intact, sensation grossly intact.  HEENT: Normocephalic, atraumatic, pupils equal round reactive to light, neck supple, no masses, no lymphadenopathy, thyroid nonpalpable.  Skin: Warm and dry, no rashes. Cardiac: Regular rate and rhythm, no murmurs rubs or gallops, no lower extremity edema.  Respiratory: Clear to auscultation bilaterally. Not using accessory muscles, speaking in full sentences.  Impression and Recommendations:    Acute meniscal tear of left knee Overall doing well with 1 tramadol daily, I will call in 3 months worth.  Obesity (BMI 30-39.9) He is losing weight actually, he would like a boost. I think is reasonable to have him talk to a nutritionist as well about dietary strategies. We tried to get him to take Ozempic but he never did it.  His A1c was 10, 2 months ago.  Diabetes mellitus, type 2 We tried to get him to take Ozempic but he never did it.  His A1c was 10, 2 months ago. He will return in 1 month for a hemoglobin A1c and if still elevated I will push him to do the Ozempic. ___________________________________________ Ihor Austin. Benjamin Stain, M.D., ABFM., CAQSM. Primary Care and Sports Medicine [facility]  Adjunct Professor of Family Medicine  [employer]

## 2018-09-03 NOTE — Assessment & Plan Note (Addendum)
He is losing weight actually, he would like a boost. I think is reasonable to have him talk to a nutritionist as well about dietary strategies. We tried to get him to take Ozempic but he never did it.  His A1c was 10, 2 months ago.

## 2018-09-04 NOTE — Telephone Encounter (Signed)
Left pt msg that RX was sent to pharmacy.

## 2018-10-11 ENCOUNTER — Other Ambulatory Visit: Payer: Self-pay | Admitting: Sports Medicine

## 2018-10-24 ENCOUNTER — Ambulatory Visit: Payer: BC Managed Care – PPO | Admitting: Sports Medicine

## 2018-10-25 ENCOUNTER — Ambulatory Visit: Payer: BC Managed Care – PPO | Admitting: Pulmonary Disease

## 2018-10-28 ENCOUNTER — Ambulatory Visit: Payer: BC Managed Care – PPO | Admitting: Sports Medicine

## 2018-11-07 ENCOUNTER — Ambulatory Visit: Payer: BC Managed Care – PPO | Admitting: Pulmonary Disease

## 2018-11-28 ENCOUNTER — Other Ambulatory Visit: Payer: Self-pay | Admitting: Sports Medicine

## 2018-11-28 DIAGNOSIS — E781 Pure hyperglyceridemia: Secondary | ICD-10-CM

## 2018-11-28 MED ORDER — IBUPROFEN 800 MG PO TABS: 800.0000 mg | ORAL_TABLET | Freq: Three times a day (TID) | ORAL | 2 refills | Status: DC | PRN

## 2018-11-28 MED ORDER — FENOFIBRATE 160 MG PO TABS: 160.0000 mg | ORAL_TABLET | Freq: Every day | ORAL | 1 refills | Status: DC

## 2018-11-29 ENCOUNTER — Other Ambulatory Visit: Payer: Self-pay | Admitting: Sports Medicine

## 2018-12-12 ENCOUNTER — Other Ambulatory Visit: Payer: Self-pay | Admitting: Sports Medicine

## 2018-12-12 DIAGNOSIS — I5189 Other ill-defined heart diseases: Secondary | ICD-10-CM

## 2018-12-12 DIAGNOSIS — E781 Pure hyperglyceridemia: Secondary | ICD-10-CM

## 2018-12-19 ENCOUNTER — Encounter: Payer: BC Managed Care – PPO | Admitting: Sports Medicine

## 2018-12-30 ENCOUNTER — Ambulatory Visit: Payer: BC Managed Care – PPO | Admitting: Sports Medicine

## 2018-12-30 ENCOUNTER — Encounter: Payer: Self-pay | Admitting: Sports Medicine

## 2018-12-30 DIAGNOSIS — E119 Type 2 diabetes mellitus without complications: Secondary | ICD-10-CM

## 2018-12-30 DIAGNOSIS — Z0000 Encounter for general adult medical examination without abnormal findings: Secondary | ICD-10-CM | POA: Diagnosis not present

## 2018-12-30 DIAGNOSIS — G8929 Other chronic pain: Secondary | ICD-10-CM

## 2018-12-30 DIAGNOSIS — M545 Low back pain: Secondary | ICD-10-CM | POA: Diagnosis not present

## 2018-12-30 DIAGNOSIS — F5102 Adjustment insomnia: Secondary | ICD-10-CM

## 2018-12-30 MED ORDER — TRAZODONE HCL 50 MG PO TABS: 50.0000 mg | ORAL_TABLET | Freq: Every day | ORAL | 1 refills | Status: DC

## 2018-12-30 MED ORDER — TRAMADOL HCL 50 MG PO TABS: 100.0000 mg | ORAL_TABLET | Freq: Two times a day (BID) | ORAL | 0 refills | Status: DC | PRN

## 2018-12-30 NOTE — Assessment & Plan Note (Signed)
Increasing tramadol to 100 mg twice daily

## 2018-12-30 NOTE — Assessment & Plan Note (Signed)
Derrick Carter does have some depressed mood manifesting as anxiety and insomnia. Starting with trazodone 50 mg in the evening. Return in 1 month for a PHQ and GAD.

## 2018-12-30 NOTE — Progress Notes (Signed)
Subjective:    CC: Several issues  HPI: Anxiety and depression: With significant insomnia, no suicidal or homicidal ideation.  Declines behavioral therapy.  Immunity: Wonders if he needs to be tested for HIV, hepatitis A, B, C.  He did get a full series of hep A and hep B vaccinations.  Chronic pain: Needs tramadol increase.  I reviewed the past medical history, family history, social history, surgical history, and allergies today and no changes were needed.  Please see the problem list section below in epic for further details.  Past Medical History: Past Medical History:  Diagnosis Date  . Acanthosis 08/24/2014  . ADHD (attention deficit hyperactivity disorder)   . Annual physical exam 04/28/2014  . Attention deficit hyperactivity disorder (ADHD) 02/04/2008   Overview:  ADHD, Combined Type  ICD-10 cut over   Last Assessment & Plan:  Relevant Hx: Course: Daily Update: Today's Plan: restart Strattera  . Callus of foot 08/07/2017  . Chest pain 10/30/2017  . Chronic myofascial pain 03/23/2017  . Chronic pain disorder 03/23/2017  . Dental crown present   . Diabetes mellitus without complication (HCC)   . Diabetes mellitus, type 2 (HCC) 04/29/2014  . Hyperlipidemia   . Hypertriglyceridemia 11/19/2012  . Insomnia 03/06/2014  . Low back pain 08/19/2015  . Migraine headache 05/19/2013  . Obesity (BMI 30-39.9) 03/28/2013  . Patellofemoral syndrome, bilateral 08/07/2017  . Pilonidal cyst 03/2013  . Pilonidal disease s/p I&D 03/10/2013 11/19/2012  . 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  . Rhinitis 08/07/2017  . 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 Thoracic spondylosis without myelopathy 03/23/2017   Past Surgical History: Past Surgical History:  Procedure Laterality Date  . LEFT HEART CATH AND CORONARY ANGIOGRAPHY N/A 12/24/2017   Procedure: LEFT HEART CATH AND CORONARY ANGIOGRAPHY;  Surgeon: Yvonne KendallEnd, Christopher, MD;  Location: [facility];  Service: Cardiovascular;  Laterality: N/A;  . NO PAST SURGERIES    . PILONIDAL CYST EXCISION N/A 03/10/2013   Procedure: CYST EXCISION PILONIDAL SIMPLE I and D ;  Surgeon: Robyne AskewPaul S Toth III, MD;  Location: [facility];  Service: General;  Laterality: N/A;   Social History: Social History   Socioeconomic History  . Marital status: Single    Spouse name: Not on file  . Number of children: Not on file  . Years of education: Not on file  . Highest education level: Not on file  Occupational History  . Not on file  Social Needs  . Financial resource strain: Not on file  . Food insecurity    Worry: Not on file    Inability: Not on file  . Transportation needs    Medical: Not on file    Non-medical: Not on file  Tobacco Use  . Smoking status: Never Smoker  . Smokeless tobacco: Never Used  Substance and Sexual Activity  . Alcohol use: No  . Drug use: No  . Sexual activity: Never  Lifestyle  . Physical activity    Days per week: Not on file    Minutes per session: Not on file  . Stress: Not on file  Relationships  . Social Musicianconnections    Talks on phone: Not on file    Gets together: Not on file    Attends religious service: Not on file    Active member of club or organization: Not on file  Attends meetings of clubs or organizations: Not on file    Relationship status: Not on file  Other Topics Concern  . Not on file  Social History Narrative  . Not on file   Family History: Family History  Problem Relation Age of Onset  . Hyperlipidemia Mother   . Heart disease Mother   . Diabetes Mother   . Hypertension Mother   . Diabetes Maternal Grandmother   . Hyperlipidemia Maternal Grandmother   . Hyperlipidemia Maternal Grandfather    Allergies: No Known Allergies Medications: See med rec.  Review of Systems: No fevers, chills, night sweats,  weight loss, chest pain, or shortness of breath.   Objective:    General: Well Developed, well nourished, and in no acute distress.  Neuro: Alert and oriented x3, extra-ocular muscles intact, sensation grossly intact.  HEENT: Normocephalic, atraumatic, pupils equal round reactive to light, neck supple, no masses, no lymphadenopathy, thyroid nonpalpable.  Skin: Warm and dry, no rashes. Cardiac: Regular rate and rhythm, no murmurs rubs or gallops, no lower extremity edema.  Respiratory: Clear to auscultation bilaterally. Not using accessory muscles, speaking in full sentences.  Impression and Recommendations:    Insomnia Derrick Carter does have some depressed mood manifesting as anxiety and insomnia. Starting with trazodone 50 mg in the evening. Return in 1 month for a PHQ and GAD.  Low back pain Increasing tramadol to 100 mg twice daily  I spent 25 minutes with this patient, greater than 50% was face-to-face time counseling regarding the above diagnoses.  ___________________________________________ Gwen Her. Dianah Field, M.D., ABFM., CAQSM. Primary Care and Sports Medicine [facility]  Adjunct Professor of [employer]

## 2018-12-31 LAB — COMPLETE METABOLIC PANEL WITH GFR
AG Ratio: 2 (calc) (ref 1.0–2.5)
ALT: 32 U/L (ref 9–46)
AST: 23 U/L (ref 10–40)
Albumin: 4.9 g/dL (ref 3.6–5.1)
Alkaline phosphatase (APISO): 31 U/L — ABNORMAL LOW (ref 36–130)
BUN: 14 mg/dL (ref 7–25)
CO2: 27 mmol/L (ref 20–32)
Calcium: 10.3 mg/dL (ref 8.6–10.3)
Chloride: 107 mmol/L (ref 98–110)
Creat: 0.89 mg/dL (ref 0.60–1.35)
GFR, Est African American: 137 mL/min/{1.73_m2} (ref >=60)
GFR, Est Non African American: 118 mL/min/{1.73_m2} (ref >=60)
Globulin: 2.4 g/dL (calc) (ref 1.9–3.7)
Glucose, Bld: 128 mg/dL — ABNORMAL HIGH (ref 65–99)
Potassium: 4.1 mmol/L (ref 3.5–5.3)
Sodium: 142 mmol/L (ref 135–146)
Total Bilirubin: 0.6 mg/dL (ref 0.2–1.2)
Total Protein: 7.3 g/dL (ref 6.1–8.1)

## 2018-12-31 LAB — HEPATITIS PANEL, ACUTE
Hep A IgM: NONREACTIVE
Hep B C IgM: NONREACTIVE
Hepatitis B Surface Ag: NONREACTIVE
Hepatitis C Ab: NONREACTIVE
SIGNAL TO CUT-OFF: 0.01 (ref <1.00)

## 2018-12-31 LAB — LIPID PANEL W/REFLEX DIRECT LDL
Cholesterol: 149 mg/dL (ref <200)
HDL: 32 mg/dL — ABNORMAL LOW (ref >=40)
LDL Cholesterol (Calc): 94 mg/dL (calc)
Non-HDL Cholesterol (Calc): 117 mg/dL (calc) (ref <130)
Total CHOL/HDL Ratio: 4.7 (calc) (ref <5.0)
Triglycerides: 130 mg/dL (ref <150)

## 2018-12-31 LAB — CBC
HCT: 48 % (ref 38.5–50.0)
Hemoglobin: 15.8 g/dL (ref 13.2–17.1)
MCH: 30.2 pg (ref 27.0–33.0)
MCHC: 32.9 g/dL (ref 32.0–36.0)
MCV: 91.6 fL (ref 80.0–100.0)
MPV: 10.3 fL (ref 7.5–12.5)
Platelets: 362 10*3/uL (ref 140–400)
RBC: 5.24 10*6/uL (ref 4.20–5.80)
RDW: 12.4 % (ref 11.0–15.0)
WBC: 7.6 10*3/uL (ref 3.8–10.8)

## 2018-12-31 LAB — HIV ANTIBODY (ROUTINE TESTING W REFLEX): HIV 1&2 Ab, 4th Generation: NONREACTIVE

## 2018-12-31 LAB — HEMOGLOBIN A1C
Hgb A1c MFr Bld: 7.1 % of total Hgb — ABNORMAL HIGH (ref <5.7)
Mean Plasma Glucose: 157 (calc)
eAG (mmol/L): 8.7 (calc)

## 2018-12-31 LAB — TSH: TSH: 2.45 mIU/L (ref 0.40–4.50)

## 2018-12-31 LAB — VITAMIN D 25 HYDROXY (VIT D DEFICIENCY, FRACTURES): Vit D, 25-Hydroxy: 22 ng/mL — ABNORMAL LOW (ref 30–100)

## 2018-12-31 MED ORDER — VITAMIN D (ERGOCALCIFEROL) 1.25 MG (50000 UNIT) PO CAPS: 50000.0000 [IU] | ORAL_CAPSULE | ORAL | 0 refills | Status: DC

## 2018-12-31 NOTE — Addendum Note (Signed)
Addended by: Silverio Decamp on: 12/31/2018 08:14 AM   Modules accepted: Orders

## 2019-01-16 ENCOUNTER — Other Ambulatory Visit: Payer: Self-pay

## 2019-01-16 ENCOUNTER — Ambulatory Visit: Payer: BC Managed Care – PPO | Admitting: Sports Medicine

## 2019-01-16 ENCOUNTER — Encounter: Payer: Self-pay | Admitting: Sports Medicine

## 2019-01-16 DIAGNOSIS — F5102 Adjustment insomnia: Secondary | ICD-10-CM

## 2019-01-16 MED ORDER — TRAZODONE HCL 100 MG PO TABS: 100.0000 mg | ORAL_TABLET | Freq: Every day | ORAL | 3 refills | Status: DC

## 2019-01-16 NOTE — Progress Notes (Signed)
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  No suicidal or homicidal ideation.  I reviewed the past medical history, family history, social history, surgical history, and allergies today and no changes were needed.  Please see the problem list section below in epic for further details.  Past Medical History: Past Medical History:  Diagnosis Date  . Acanthosis 08/24/2014  . ADHD (attention deficit hyperactivity disorder)   . Annual physical exam 04/28/2014  . Attention deficit hyperactivity disorder (ADHD) 02/04/2008   Overview:  ADHD, Combined Type  ICD-10 cut over   Last Assessment & Plan:  Relevant Hx: Course: Daily Update: Today's Plan: restart Strattera  . Callus of foot 08/07/2017  . Chest pain 10/30/2017  . Chronic myofascial pain 03/23/2017  . Chronic pain disorder 03/23/2017  . Dental crown present   . Diabetes mellitus without complication ([place])   . Diabetes mellitus, type 2 ([place]) 04/29/2014  . Hyperlipidemia   . Hypertriglyceridemia 11/19/2012  . Insomnia 03/06/2014  . Low back pain 08/19/2015  . Migraine headache 05/19/2013  . Obesity (BMI 30-39.9) 03/28/2013  . Patellofemoral syndrome, bilateral 08/07/2017  . Pilonidal cyst 03/2013  . Pilonidal disease s/p I&D 03/10/2013 11/19/2012  . 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  . Rhinitis 08/07/2017  . 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 Thoracic spondylosis without myelopathy 03/23/2017   Past Surgical History: Past Surgical History:  Procedure Laterality Date  . LEFT HEART CATH AND CORONARY ANGIOGRAPHY N/A 12/24/2017   Procedure: LEFT HEART CATH AND CORONARY ANGIOGRAPHY;  Surgeon: Nelva Bush, MD;  Location: [facility];  Service: Cardiovascular;  Laterality: N/A;  . NO PAST SURGERIES    . PILONIDAL CYST EXCISION N/A 03/10/2013   Procedure: CYST EXCISION PILONIDAL SIMPLE I and D ;  Surgeon: Merrie Roof, MD;  Location: [facility];  Service: General;  Laterality: N/A;   Social History: Social History   Socioeconomic History  . Marital status: Single    Spouse name: Not on file  . Number of children: Not on file  . Years of education: Not on file  . Highest education level: Not on file  Occupational History  . Not on file  Social Needs  . Financial resource strain: Not on file  . Food insecurity    Worry: Not on file    Inability: Not on file  . Transportation needs    Medical: Not on file    Non-medical: Not on file  Tobacco Use  . Smoking status: Never Smoker  . Smokeless tobacco: Never Used  Substance and Sexual Activity  . Alcohol use: No  . Drug use: No  . Sexual activity: Never  Lifestyle  . Physical activity    Days per week: Not on file    Minutes per session: Not on file  . Stress: Not on file  Relationships  . Social Herbalist on phone: Not on file    Gets together: Not on file    Attends religious service: Not on file    Active member of club or organization: Not on file    Attends meetings of clubs or organizations: Not on file  Relationship status: Not on file  Other Topics Concern  . Not on file  Social History Narrative  . Not on file   Family History: Family History  Problem Relation Age of Onset  . Hyperlipidemia Mother   . Heart disease Mother   . Diabetes Mother   . Hypertension Mother   . Diabetes Maternal Grandmother   . Hyperlipidemia Maternal Grandmother   . Hyperlipidemia Maternal Grandfather    Allergies: No Known Allergies Medications: See med rec.  Review of Systems: No fevers, chills, night sweats, weight loss, chest pain, or shortness of breath.   Objective:     General: Well Developed, well nourished, and in no acute distress.  Neuro: Alert and oriented x3, extra-ocular muscles intact, sensation grossly intact.  HEENT: Normocephalic, atraumatic, pupils equal round reactive to light, neck supple, no masses, no lymphadenopathy, thyroid nonpalpable.  Skin: Warm and dry, no rashes. Cardiac: Regular rate and rhythm, no murmurs rubs or gallops, no lower extremity edema.  Respiratory: Clear to auscultation bilaterally. Not using accessory muscles, speaking in full sentences.  Impression and Recommendations:    Insomnia Significant objective improvement and depressed mood and anxiety. Insomnia still has not improved all that much. He does have odd shifts that will make it difficult to rest appropriately, we are going to increase to 100 mg of trazodone when he goes to bed. We can revisit this in 1 month.   ___________________________________________ Ihor Austinhomas J. Benjamin Stainhekkekandam, M.D., ABFM., CAQSM. Primary Care and Sports Medicine [facility]  Adjunct Professor of Family Medicine  [employer]

## 2019-01-16 NOTE — Assessment & Plan Note (Signed)
Significant objective improvement and depressed mood and anxiety. Insomnia still has not improved all that much. He does have odd shifts that will make it difficult to rest appropriately, we are going to increase to 100 mg of trazodone when he goes to bed. We can revisit this in 1 month.

## 2019-01-20 ENCOUNTER — Other Ambulatory Visit: Payer: Self-pay | Admitting: Sports Medicine

## 2019-02-05 LAB — HM DIABETES EYE EXAM

## 2019-02-17 ENCOUNTER — Other Ambulatory Visit: Payer: Self-pay | Admitting: Sports Medicine

## 2019-02-21 ENCOUNTER — Ambulatory Visit: Payer: BC Managed Care – PPO | Admitting: Sports Medicine

## 2019-02-21 ENCOUNTER — Other Ambulatory Visit: Payer: Self-pay

## 2019-02-21 ENCOUNTER — Encounter: Payer: Self-pay | Admitting: Sports Medicine

## 2019-02-21 DIAGNOSIS — F5102 Adjustment insomnia: Secondary | ICD-10-CM

## 2019-02-21 DIAGNOSIS — E119 Type 2 diabetes mellitus without complications: Secondary | ICD-10-CM | POA: Diagnosis not present

## 2019-02-21 MED ORDER — QUETIAPINE FUMARATE 25 MG PO TABS: 25.0000 mg | ORAL_TABLET | Freq: Every day | ORAL | 3 refills | Status: DC

## 2019-02-21 MED ORDER — CITALOPRAM HYDROBROMIDE 10 MG PO TABS: 10.0000 mg | ORAL_TABLET | Freq: Every day | ORAL | 3 refills | Status: DC

## 2019-02-21 NOTE — Assessment & Plan Note (Addendum)
Has not responded to trazodone at 100 mg. Discontinued this, switching to Seroquel 25. He also has some depression, adding Celexa 10. Return monthly for dose adjustment.

## 2019-02-21 NOTE — Assessment & Plan Note (Signed)
Overall well controlled, foot exam was done today, referral for eye exam

## 2019-02-21 NOTE — Progress Notes (Signed)
Subjective:    CC: Insomnia  HPI: Derrick Carter returns, he has a bit of shiftwork sleep disorder, anxiety, depression, insomnia.  Initially we started with trazodone at night, 50 mg was minimally efficacious we increased to 100 mg.  He has not noticed any further improvement and desires to switch to a different medication.  No suicidal or homicidal ideation.  Diabetes mellitus type 2: Well-controlled, due for foot exam and eye exam.  I reviewed the past medical history, family history, social history, surgical history, and allergies today and no changes were needed.  Please see the problem list section below in epic for further details.  Past Medical History: Past Medical History:  Diagnosis Date  . Acanthosis 08/24/2014  . ADHD (attention deficit hyperactivity disorder)   . Annual physical exam 04/28/2014  . Attention deficit hyperactivity disorder (ADHD) 02/04/2008   Overview:  ADHD, Combined Type  ICD-10 cut over   Last Assessment & Plan:  Relevant Hx: Course: Daily Update: Today's Plan: restart Strattera  . Callus of foot 08/07/2017  . Chest pain 10/30/2017  . Chronic myofascial pain 03/23/2017  . Chronic pain disorder 03/23/2017  . Dental crown present   . Diabetes mellitus without complication ([place])   . Diabetes mellitus, type 2 ([place]) 04/29/2014  . Hyperlipidemia   . Hypertriglyceridemia 11/19/2012  . Insomnia 03/06/2014  . Low back pain 08/19/2015  . Migraine headache 05/19/2013  . Obesity (BMI 30-39.9) 03/28/2013  . Patellofemoral syndrome, bilateral 08/07/2017  . Pilonidal cyst 03/2013  . Pilonidal disease s/p I&D 03/10/2013 11/19/2012  . 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  . Rhinitis 08/07/2017  . 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 Thoracic spondylosis without myelopathy 03/23/2017   Past Surgical History: Past Surgical  History:  Procedure Laterality Date  . LEFT HEART CATH AND CORONARY ANGIOGRAPHY N/A 12/24/2017   Procedure: LEFT HEART CATH AND CORONARY ANGIOGRAPHY;  Surgeon: Nelva Bush, MD;  Location: [facility];  Service: Cardiovascular;  Laterality: N/A;  . NO PAST SURGERIES    . PILONIDAL CYST EXCISION N/A 03/10/2013   Procedure: CYST EXCISION PILONIDAL SIMPLE I and D ;  Surgeon: Merrie Roof, MD;  Location: [facility];  Service: General;  Laterality: N/A;   Social History: Social History   Socioeconomic History  . Marital status: Single    Spouse name: Not on file  . Number of children: Not on file  . Years of education: Not on file  . Highest education level: Not on file  Occupational History  . Not on file  Social Needs  . Financial resource strain: Not on file  . Food insecurity    Worry: Not on file    Inability: Not on file  . Transportation needs    Medical: Not on file    Non-medical: Not on file  Tobacco Use  . Smoking status: Never Smoker  . Smokeless tobacco: Never Used  Substance and Sexual Activity  . Alcohol use: No  . Drug use: No  . Sexual activity: Never  Lifestyle  . Physical activity    Days per week: Not on file    Minutes per session: Not on file  . Stress: Not on file  Relationships  . Social Herbalist on phone: Not on file    Gets together: Not on file    Attends religious service:  Not on file    Active member of club or organization: Not on file    Attends meetings of clubs or organizations: Not on file    Relationship status: Not on file  Other Topics Concern  . Not on file  Social History Narrative  . Not on file   Family History: Family History  Problem Relation Age of Onset  . Hyperlipidemia Mother   . Heart disease Mother   . Diabetes Mother   . Hypertension Mother   . Diabetes Maternal Grandmother   . Hyperlipidemia Maternal Grandmother   . Hyperlipidemia Maternal Grandfather    Allergies: No  Known Allergies Medications: See med rec.  Review of Systems: No fevers, chills, night sweats, weight loss, chest pain, or shortness of breath.   Objective:    General: Well Developed, well nourished, and in no acute distress.  Neuro: Alert and oriented x3, extra-ocular muscles intact, sensation grossly intact.  HEENT: Normocephalic, atraumatic, pupils equal round reactive to light, neck supple, no masses, no lymphadenopathy, thyroid nonpalpable.  Skin: Warm and dry, no rashes. Cardiac: Regular rate and rhythm, no murmurs rubs or gallops, no lower extremity edema.  Respiratory: Clear to auscultation bilaterally. Not using accessory muscles, speaking in full sentences.  Diabetic Foot Exam Both feet were examined, there are no signs of ulceration or abnormal callus. Nails are unremarkable. Dorsalis pedis and posterior tibial pulses are palpable. Sensation is intact to sharp and monofilament. Shoes are of appropriate fitment.  Impression and Recommendations:    Insomnia Has not responded to trazodone at 100 mg. Discontinued this, switching to Seroquel 25. He also has some depression, adding Celexa 10. Return monthly for dose adjustment.  Diabetes mellitus, type 2 Overall well controlled, foot exam was done today, referral for eye exam   ___________________________________________ Ihor Austinhomas J. Benjamin Stainhekkekandam, M.D., ABFM., CAQSM. Primary Care and Sports Medicine [facility]  Adjunct Professor of Family Medicine  [employer]

## 2019-03-06 ENCOUNTER — Encounter: Payer: Self-pay | Admitting: Sports Medicine

## 2019-03-15 ENCOUNTER — Other Ambulatory Visit: Payer: Self-pay | Admitting: Sports Medicine

## 2019-03-15 DIAGNOSIS — F5102 Adjustment insomnia: Secondary | ICD-10-CM

## 2019-04-09 ENCOUNTER — Other Ambulatory Visit: Payer: Self-pay | Admitting: Sports Medicine

## 2019-04-09 DIAGNOSIS — E781 Pure hyperglyceridemia: Secondary | ICD-10-CM

## 2019-04-10 ENCOUNTER — Encounter: Payer: Self-pay | Admitting: Critical Care Medicine

## 2019-04-10 ENCOUNTER — Other Ambulatory Visit: Payer: Self-pay

## 2019-04-10 ENCOUNTER — Ambulatory Visit: Payer: BC Managed Care – PPO | Admitting: Critical Care Medicine

## 2019-04-10 VITALS — BP 118/68 | HR 73 | Temp 97.1°F | Ht 71.0 in | Wt 191.0 lb

## 2019-04-10 DIAGNOSIS — Z23 Encounter for immunization: Secondary | ICD-10-CM | POA: Diagnosis not present

## 2019-04-10 DIAGNOSIS — J4520 Mild intermittent asthma, uncomplicated: Secondary | ICD-10-CM

## 2019-04-10 DIAGNOSIS — J4530 Mild persistent asthma, uncomplicated: Secondary | ICD-10-CM | POA: Diagnosis not present

## 2019-04-10 MED ORDER — DULERA 100-5 MCG/ACT IN AERO: 2.0000 | INHALATION_SPRAY | Freq: Two times a day (BID) | RESPIRATORY_TRACT | 11 refills | Status: DC

## 2019-04-10 MED ORDER — ALBUTEROL SULFATE HFA 108 (90 BASE) MCG/ACT IN AERS: 2.0000 | INHALATION_SPRAY | Freq: Four times a day (QID) | RESPIRATORY_TRACT | 11 refills | Status: DC | PRN

## 2019-04-10 NOTE — Progress Notes (Signed)
Synopsis: Referred in August 2019 for shortness of breath by Derrick Carter,*.  Previously patient of Dr. Lake Carter.  Subjective:   PATIENT ID: Derrick Carter GENDER: male DOB: March 06, 1991, MRN: [id number]  Chief Complaint  Patient presents with  . Follow-up    F/U for asthma. Has been on [facility] for a while but insurance will not cover this. He has been paying over $300 a month.     Derrick Carter is a 28 year old gentleman who presents for follow-up of chronic asthma.  He is accompanied by his mother to this visit today.  Recently he has been doing well on Dulera, but his insurance does not cover it and he is paying over $300 out-of-pocket.  He has previously tried Arnuity, Symbicort, and Advair with severe recurrence of symptoms.  He denies dyspnea on exertion, activity limitation, coughing, wheezing.  He uses his albuterol inhaler every day twice daily at baseline-once in the morning and once before work, not due to symptoms.  He has never been hospitalized for his asthma or required systemic steroids for exacerbations.  He works for the [employer], and has been compliant with mask wearing.  He has been very cautious knowing that he is at risk for exposure to COVID-19.  He has not yet had his flu vaccination.  Currently his allergies are well controlled.  During the winter he is able to come off of his Claritin and Flonase.  He has not undergone allergy testing in the past.  He denies heartburn.     Past Medical History:  Diagnosis Date  . Acanthosis 08/24/2014  . ADHD (attention deficit hyperactivity disorder)   . Annual physical exam 04/28/2014  . Attention deficit hyperactivity disorder (ADHD) 02/04/2008   Overview:  ADHD, Combined Type  ICD-10 cut over   Last Assessment & Plan:  Relevant Hx: Course: Daily Update: Today's Plan: restart Strattera  . Callus of foot 08/07/2017  . Chest pain 10/30/2017  . Chronic myofascial pain 03/23/2017  . Chronic pain disorder 03/23/2017   . Dental crown present   . Diabetes mellitus without complication ([place])   . Diabetes mellitus, type 2 ([place]) 04/29/2014  . Hyperlipidemia   . Hypertriglyceridemia 11/19/2012  . Insomnia 03/06/2014  . Low back pain 08/19/2015  . Migraine headache 05/19/2013  . Obesity (BMI 30-39.9) 03/28/2013  . Patellofemoral syndrome, bilateral 08/07/2017  . Pilonidal cyst 03/2013  . Pilonidal disease s/p I&D 03/10/2013 11/19/2012  . Rhinitis 08/07/2017  . 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 Thoracic spondylosis without myelopathy 03/23/2017     Family History  Problem Relation Age of Onset  . Hyperlipidemia Mother   . Heart disease Mother   . Diabetes Mother   . Hypertension Mother   . Diabetes Maternal Grandmother   . Hyperlipidemia Maternal Grandmother   . Hyperlipidemia Maternal Grandfather      Past Surgical History:  Procedure Laterality Date  . LEFT HEART CATH AND CORONARY ANGIOGRAPHY N/A 12/24/2017   Procedure: LEFT HEART CATH AND CORONARY ANGIOGRAPHY;  Surgeon: Derrick Bush, MD;  Location: [facility] [place] [facility];  Service: Cardiovascular;  Laterality: N/A;  . NO PAST SURGERIES    . PILONIDAL CYST EXCISION N/A 03/10/2013   Procedure: CYST EXCISION PILONIDAL SIMPLE I and D ;  Surgeon: Derrick Roof, MD;  Location: [place];  Service: General;  Laterality: N/A;    Social History   Socioeconomic History  . Marital status: Single    Spouse name: Not on file  .  Number of children: Not on file  . Years of education: Not on file  . Highest education level: Not on file  Occupational History  . Not on file  Social Needs  . Financial resource strain: Not on file  . Food insecurity    Worry: Not on file    Inability: Not on file  . Transportation needs    Medical: Not on file    Non-medical: Not on file  Tobacco Use  . Smoking status: Never Smoker  . Smokeless tobacco: Never Used  Substance and Sexual Activity  . Alcohol use: No  . Drug use: No  . Sexual activity: Never   Lifestyle  . Physical activity    Days per week: Not on file    Minutes per session: Not on file  . Stress: Not on file  Relationships  . Social Herbalist on phone: Not on file    Gets together: Not on file    Attends religious service: Not on file    Active member of club or organization: Not on file    Attends meetings of clubs or organizations: Not on file    Relationship status: Not on file  . Intimate partner violence    Fear of current or ex partner: Not on file    Emotionally abused: Not on file    Physically abused: Not on file    Forced sexual activity: Not on file  Other Topics Concern  . Not on file  Social History Narrative  . Not on file     No Known Allergies   Immunization History  Administered Date(s) Administered  . DTaP 07/13/1992, 09/26/1992, 11/12/1992, 11/25/1993, 07/15/1996  . HPV Quadrivalent 04/13/2010, 10/21/2010, 09/06/2011  . Hepatitis A, Ped/Adol-2 Dose 01/24/2006, 08/21/2006  . Hepatitis B, ped/adol 1990-11-19, 07/13/1992, 11/12/1992  . HiB (PRP-OMP) 07/13/1992, 09/21/1992, 11/12/1992, 08/15/1993  . IPV 07/13/1992, 09/21/1992, 11/25/1993, 07/15/1996  . Influenza Nasal 03/31/2011  . Influenza,Quad,Nasal, Live 04/16/2012  . Influenza,inj,Quad PF,6+ Mos 05/07/2013, 04/28/2014, 04/01/2015, 05/01/2016, 03/28/2018  . Influenza-Unspecified 05/20/2008, 04/22/2009, 04/13/2010  . MMR 08/15/1993, 07/15/1996  . Meningococcal Conjugate 01/24/2006, 04/13/2010  . Pneumococcal Polysaccharide-23 05/05/2014  . Td 05/19/2004, 02/11/2008  . Tdap 02/11/2008, 11/16/2015  . Varicella 05/12/1994    Outpatient Medications Prior to Visit  Medication Sig Dispense Refill  . albuterol (PROVENTIL) (2.5 MG/3ML) 0.083% nebulizer solution Take 3 mLs (2.5 mg total) by nebulization every 6 (six) hours as needed for wheezing or shortness of breath. 360 mL 5  . atorvastatin (LIPITOR) 40 MG tablet TAKE 1 TABLET BY MOUTH DAILY 90 tablet 0  . citalopram (CELEXA) 10  MG tablet TAKE 1 TABLET BY MOUTH EVERY DAY 90 tablet 2  . diphenhydrAMINE (BENADRYL) 50 MG tablet One tab 30 minutes before desired sleep time 30 tablet 11  . fenofibrate 160 MG tablet TAKE 1 TABLET BY MOUTH EVERY DAY 90 tablet 1  . finasteride (PROPECIA) 1 MG tablet Take 1 tablet (1 mg total) by mouth daily. 90 tablet 3  . fluticasone (FLONASE) 50 MCG/ACT nasal spray One spray in each nostril twice a day, use left hand for right nostril, and right hand for left nostril. 48 g 3  . loratadine (CLARITIN) 10 MG tablet TAKE 1 TABLET BY MOUTH EVERY DAY 90 tablet 0  . minoxidil (MINOXIDIL FOR MEN) 2 % external solution Apply topically 2 (two) times daily. 60 mL 11  . QUEtiapine (SEROQUEL) 25 MG tablet TAKE 1 TABLET BY MOUTH EVERYDAY AT BEDTIME 90 tablet  2  . traMADol (ULTRAM) 50 MG tablet Take 2 tablets (100 mg total) by mouth every 12 (twelve) hours as needed for moderate pain. 120 tablet 0  . Vitamin D, Ergocalciferol, (DRISDOL) 1.25 MG (50000 UT) CAPS capsule Take 1 capsule (50,000 Units total) by mouth every 7 (seven) days. Take for 8 total doses(weeks) 8 capsule 0  . XIGDUO XR 04-999 MG TB24 TAKE 1 TABLET BY MOUTH EVERY DAY 30 tablet 3  . albuterol (PROVENTIL HFA;VENTOLIN HFA) 108 (90 Base) MCG/ACT inhaler Inhale 2 puffs into the lungs every 6 (six) hours as needed for wheezing or shortness of breath. 1 Inhaler 2  . mometasone-formoterol (DULERA) 100-5 MCG/ACT AERO Inhale 2 puffs into the lungs 2 (two) times daily. 1 Inhaler 5   No facility-administered medications prior to visit.     Review of Systems  Constitutional: Negative for chills, fever and weight loss.  HENT: Negative for congestion and sore throat.   Eyes: Negative.   Respiratory: Negative for cough, hemoptysis, sputum production and wheezing.   Cardiovascular: Negative for chest pain and leg swelling.  Gastrointestinal: Negative for heartburn, nausea and vomiting.  Genitourinary: Negative.   Musculoskeletal: Negative.   Skin:  Negative for rash.  Neurological: Negative.   Endo/Heme/Allergies: Positive for environmental allergies.     Objective:   Vitals:   04/10/19 1609  BP: 118/68  Pulse: 73  Temp: (!) 97.1 F (36.2 C)  TempSrc: Temporal  SpO2: 97%  Weight: 191 lb (86.6 kg)  Height: 5' 11"  (1.803 m)   97% on  RA BMI Readings from Last 3 Encounters:  04/10/19 26.64 kg/m  02/21/19 26.08 kg/m  01/16/19 25.94 kg/m   Wt Readings from Last 3 Encounters:  04/10/19 191 lb (86.6 kg)  02/21/19 187 lb (84.8 kg)  01/16/19 186 lb (84.4 kg)    Physical Exam Vitals signs reviewed.  Constitutional:      General: He is not in acute distress.    Appearance: Normal appearance. He is not ill-appearing.  HENT:     Head: Normocephalic and atraumatic.     Nose:     Comments: Deferred due to masking requirement.    Mouth/Throat:     Comments: Deferred due to masking requirement. Eyes:     General: No scleral icterus. Neck:     Musculoskeletal: Neck supple.  Cardiovascular:     Rate and Rhythm: Normal rate and regular rhythm.     Heart sounds: No murmur.  Pulmonary:     Comments: Breathing comfortably on room air, no conversational dyspnea.  Clear to auscultation bilaterally. Abdominal:     General: There is no distension.     Palpations: Abdomen is soft.  Musculoskeletal:        General: No swelling or deformity.  Lymphadenopathy:     Cervical: No cervical adenopathy.  Skin:    General: Skin is warm and dry.     Findings: No bruising or rash.  Neurological:     Mental Status: He is alert.     Motor: No weakness.     Coordination: Coordination normal.  Psychiatric:        Mood and Affect: Mood normal.        Behavior: Behavior normal.      CBC    Component Value Date/Time   WBC 7.6 12/30/2018 1534   RBC 5.24 12/30/2018 1534   HGB 15.8 12/30/2018 1534   HGB 15.9 12/13/2017 1140   HCT 48.0 12/30/2018 1534   HCT 48.7 12/13/2017 1140  PLT 362 12/30/2018 1534   PLT 322 12/13/2017  1140   MCV 91.6 12/30/2018 1534   MCV 93 12/13/2017 1140   MCH 30.2 12/30/2018 1534   MCHC 32.9 12/30/2018 1534   RDW 12.4 12/30/2018 1534   RDW 12.8 12/13/2017 1140   LYMPHSABS 3.0 08/19/2015 1155   MONOABS 0.8 08/19/2015 1155   EOSABS 0.1 08/19/2015 1155   BASOSABS 0.0 08/19/2015 1155    CHEMISTRY CMP Latest Ref Rng & Units 12/30/2018 07/04/2018 01/02/2018  Glucose 65 - 99 mg/dL 128(H) 223(H) 100(H)  BUN 7 - 25 mg/dL 14 16 19   Creatinine 0.60 - 1.35 mg/dL 0.89 0.96 0.97  Sodium 135 - 146 mmol/L 142 138 139  Potassium 3.5 - 5.3 mmol/L 4.1 4.1 4.0  Chloride 98 - 110 mmol/L 107 101 99  CO2 20 - 32 mmol/L 27 26 24   Calcium 8.6 - 10.3 mg/dL 10.3 10.3 9.8  Total Protein 6.1 - 8.1 g/dL 7.3 7.9 -  Total Bilirubin 0.2 - 1.2 mg/dL 0.6 0.8 -  Alkaline Phos 40 - 115 U/L - - -  AST 10 - 40 U/L 23 30 -  ALT 9 - 46 U/L 32 48(H) -     Chest Imaging- films reviewed: CT chest high-resolution 11/27/2017- no ILD or other abnormalities  Pulmonary Functions Testing Results: PFT Results Latest Ref Rng & Units 02/05/2018  FVC-Pre L 4.56  FVC-Predicted Pre % 90  FVC-Post L 4.48  FVC-Predicted Post % 88  Pre FEV1/FVC % % 81  Post FEV1/FCV % % 85  FEV1-Pre L 3.68  FEV1-Predicted Pre % 87  FEV1-Post L 3.81  DLCO UNC% % 112  DLCO COR %Predicted % 120  TLC L 6.84  TLC % Predicted % 108  RV % Predicted % 126  No significant obstruction or bronchodilator reversibility. Normal lung capacity and diffusion capacity.  Small airways disease.  May 2019 spirometry test from primary care: Ratio normal, forced vital capacity 4.1 L (73% predicted)  Echocardiogram: July 2019 transthoracic echocardiogram showed an LVEF of 60 to 65% and otherwise normal parameters  Heart Catheterization June 2019- left heart catheterization showed no evidence of coronary artery disease, normal LVEF with mildly elevated left ventricular end-diastolic pressure    Assessment & Plan:     ICD-10-CM   1. Mild intermittent  asthma, unspecified whether complicated  N40.76 mometasone-formoterol (DULERA) 100-5 MCG/ACT AERO    albuterol (VENTOLIN HFA) 108 (90 Base) MCG/ACT inhaler  2. Mild persistent allergic asthma  J45.30    Needs prior auth- arnuity, symbicort, advair previously tried  Mild intermittent asthma-currently controlled -Continue Dulera twice daily.  Will fill out prior authorization as he has previously failed on Arnuity, Symbicort, and Advair. -Continue albuterol as needed -Flu shot and pneumococcal 23 vaccine today - Encouraged to continue to remain vigilant with COVID precautions-mask wearing, social distancing, handwashing. -All inhalers refilled today.  Allergic rhinosinusitis-well-controlled -Continue Flonase and Claritin as needed - He understands that his asthma is likely to become more difficult to control if his allergies are poorly controlled  40 minutes spent on this encounter, including greater than 25 minutes spent with the patient and his mother.  RTC in 1 year.   Current Outpatient Medications:  .  albuterol (PROVENTIL) (2.5 MG/3ML) 0.083% nebulizer solution, Take 3 mLs (2.5 mg total) by nebulization every 6 (six) hours as needed for wheezing or shortness of breath., Disp: 360 mL, Rfl: 5 .  albuterol (VENTOLIN HFA) 108 (90 Base) MCG/ACT inhaler, Inhale 2 puffs into the lungs  every 6 (six) hours as needed for wheezing or shortness of breath., Disp: 6.7 g, Rfl: 11 .  atorvastatin (LIPITOR) 40 MG tablet, TAKE 1 TABLET BY MOUTH DAILY, Disp: 90 tablet, Rfl: 0 .  citalopram (CELEXA) 10 MG tablet, TAKE 1 TABLET BY MOUTH EVERY DAY, Disp: 90 tablet, Rfl: 2 .  diphenhydrAMINE (BENADRYL) 50 MG tablet, One tab 30 minutes before desired sleep time, Disp: 30 tablet, Rfl: 11 .  fenofibrate 160 MG tablet, TAKE 1 TABLET BY MOUTH EVERY DAY, Disp: 90 tablet, Rfl: 1 .  finasteride (PROPECIA) 1 MG tablet, Take 1 tablet (1 mg total) by mouth daily., Disp: 90 tablet, Rfl: 3 .  fluticasone (FLONASE) 50  MCG/ACT nasal spray, One spray in each nostril twice a day, use left hand for right nostril, and right hand for left nostril., Disp: 48 g, Rfl: 3 .  loratadine (CLARITIN) 10 MG tablet, TAKE 1 TABLET BY MOUTH EVERY DAY, Disp: 90 tablet, Rfl: 0 .  minoxidil (MINOXIDIL FOR MEN) 2 % external solution, Apply topically 2 (two) times daily., Disp: 60 mL, Rfl: 11 .  mometasone-formoterol (DULERA) 100-5 MCG/ACT AERO, Inhale 2 puffs into the lungs 2 (two) times daily., Disp: 8.8 g, Rfl: 11 .  QUEtiapine (SEROQUEL) 25 MG tablet, TAKE 1 TABLET BY MOUTH EVERYDAY AT BEDTIME, Disp: 90 tablet, Rfl: 2 .  traMADol (ULTRAM) 50 MG tablet, Take 2 tablets (100 mg total) by mouth every 12 (twelve) hours as needed for moderate pain., Disp: 120 tablet, Rfl: 0 .  Vitamin D, Ergocalciferol, (DRISDOL) 1.25 MG (50000 UT) CAPS capsule, Take 1 capsule (50,000 Units total) by mouth every 7 (seven) days. Take for 8 total doses(weeks), Disp: 8 capsule, Rfl: 0 .  XIGDUO XR 04-999 MG TB24, TAKE 1 TABLET BY MOUTH EVERY DAY, Disp: 30 tablet, Rfl: 3   Julian Hy, DO [facility] 04/10/2019 4:57 PM

## 2019-04-10 NOTE — Patient Instructions (Addendum)
Thank you for visiting Dr. Carlis Abbott at [facility]. We recommend the following:   Meds ordered this encounter  Medications  . mometasone-formoterol (DULERA) 100-5 MCG/ACT AERO    Sig: Inhale 2 puffs into the lungs 2 (two) times daily.    Dispense:  8.8 g    Refill:  11  . albuterol (VENTOLIN HFA) 108 (90 Base) MCG/ACT inhaler    Sig: Inhale 2 puffs into the lungs every 6 (six) hours as needed for wheezing or shortness of breath.    Dispense:  6.7 g    Refill:  11    Return in about 1 year (around 04/09/2020).    Please do your part to reduce the spread of COVID-19.

## 2019-04-10 NOTE — Addendum Note (Signed)
Addended by: Valerie Salts on: 04/10/2019 05:03 PM   Modules accepted: Orders

## 2019-04-14 ENCOUNTER — Other Ambulatory Visit: Payer: Self-pay | Admitting: *Deleted

## 2019-04-14 DIAGNOSIS — G8929 Other chronic pain: Secondary | ICD-10-CM

## 2019-04-14 MED ORDER — TRAMADOL HCL 50 MG PO TABS: 100.0000 mg | ORAL_TABLET | Freq: Two times a day (BID) | ORAL | 0 refills | Status: DC | PRN

## 2019-04-15 ENCOUNTER — Telehealth: Payer: Self-pay | Admitting: Critical Care Medicine

## 2019-04-15 DIAGNOSIS — J4530 Mild persistent asthma, uncomplicated: Secondary | ICD-10-CM

## 2019-04-15 NOTE — Telephone Encounter (Signed)
Patient was seen by Dr. Carlis Abbott on 04/10/2019. He stated that his insurance would not cover his [facility] and he had been paying for it every month out of pocket. I advised the patient that I would start a PA on this for him, he verbalized understanding.   PA was started on CoverMyMeds. Key: [facility]  PA was denied due to [facility] not receiving information about medications that had been tried and failed. This information was provided to them in the PA.   Called and spoke with the patient to provide him with an update. Advised him that I would start the appeal today. He verbalized understanding.

## 2019-04-21 ENCOUNTER — Other Ambulatory Visit: Payer: Self-pay | Admitting: Sports Medicine

## 2019-04-21 DIAGNOSIS — E781 Pure hyperglyceridemia: Secondary | ICD-10-CM

## 2019-04-21 MED ORDER — ATORVASTATIN CALCIUM 40 MG PO TABS: 40.0000 mg | ORAL_TABLET | Freq: Every day | ORAL | 1 refills | Status: DC

## 2019-04-21 MED ORDER — BREO ELLIPTA 200-25 MCG/INH IN AEPB: 1.0000 | INHALATION_SPRAY | Freq: Every day | RESPIRATORY_TRACT | 11 refills | Status: DC

## 2019-04-21 NOTE — Telephone Encounter (Signed)
Derrick Carter was again denied by the [place] per documentation from 04/18/2019.  They indicate that he has yet to fail 3 of 4 formulary alternatives and requests that he try [place] for [facility] would be covered.  The formulary alternatives that he would have to have tried include Advair Diskus, Advair HFA, Breo Ellipta, and Symbicort or Dulera would be approved.  Breo prescribed.  We can also add Asmanex twice daily to provide additional inhaled steroid that may help.  If he fails on [place], we can resubmit claim.  Orders for Breo placed.  Julian Hy, DO 04/21/19 5:54 PM [facility]

## 2019-04-22 NOTE — Telephone Encounter (Signed)
ATC patient unable to reach LM to call back office (x1)  

## 2019-04-24 NOTE — Telephone Encounter (Signed)
ATC patient unable to reach LM to call back office (x2) 

## 2019-04-29 ENCOUNTER — Ambulatory Visit (INDEPENDENT_AMBULATORY_CARE_PROVIDER_SITE_OTHER): Payer: BC Managed Care – PPO

## 2019-04-29 ENCOUNTER — Ambulatory Visit (INDEPENDENT_AMBULATORY_CARE_PROVIDER_SITE_OTHER): Payer: BC Managed Care – PPO | Admitting: Sports Medicine

## 2019-04-29 ENCOUNTER — Other Ambulatory Visit: Payer: Self-pay

## 2019-04-29 ENCOUNTER — Encounter: Payer: Self-pay | Admitting: Sports Medicine

## 2019-04-29 DIAGNOSIS — F39 Unspecified mood [affective] disorder: Secondary | ICD-10-CM

## 2019-04-29 DIAGNOSIS — F5105 Insomnia due to other mental disorder: Secondary | ICD-10-CM

## 2019-04-29 DIAGNOSIS — K5900 Constipation, unspecified: Secondary | ICD-10-CM

## 2019-04-29 DIAGNOSIS — K582 Mixed irritable bowel syndrome: Secondary | ICD-10-CM | POA: Insufficient documentation

## 2019-04-29 MED ORDER — POLYETHYLENE GLYCOL 3350 17 G PO PACK: 17.0000 g | PACK | Freq: Two times a day (BID) | ORAL | 11 refills | Status: DC

## 2019-04-29 MED ORDER — QUETIAPINE FUMARATE 50 MG PO TABS: 50.0000 mg | ORAL_TABLET | Freq: Every day | ORAL | 3 refills | Status: DC

## 2019-04-29 MED ORDER — CITALOPRAM HYDROBROMIDE 20 MG PO TABS: 20.0000 mg | ORAL_TABLET | Freq: Every day | ORAL | 3 refills | Status: DC

## 2019-04-29 NOTE — Assessment & Plan Note (Signed)
Stools every 2 days, abdominal x-rays, MiraLAX twice daily.

## 2019-04-29 NOTE — Progress Notes (Signed)
Subjective:    CC: Follow-up  HPI: Insomnia: Did not respond to Seroquel 25.  Mood disorder: Increasing irritability, insomnia, has not responded to 1 month of citalopram 10.  Constipation: No stool in a couple of days, feels bloated, jail doc tells him he is full of stool.  I reviewed the past medical history, family history, social history, surgical history, and allergies today and no changes were needed.  Please see the problem list section below in epic for further details.  Past Medical History: Past Medical History:  Diagnosis Date  . Acanthosis 08/24/2014  . ADHD (attention deficit hyperactivity disorder)   . Annual physical exam 04/28/2014  . Attention deficit hyperactivity disorder (ADHD) 02/04/2008   Overview:  ADHD, Combined Type  ICD-10 cut over   Last Assessment & Plan:  Relevant Hx: Course: Daily Update: Today's Plan: restart Strattera  . Callus of foot 08/07/2017  . Chest pain 10/30/2017  . Chronic myofascial pain 03/23/2017  . Chronic pain disorder 03/23/2017  . Dental crown present   . Diabetes mellitus without complication ([place])   . Diabetes mellitus, type 2 ([place]) 04/29/2014  . Hyperlipidemia   . Hypertriglyceridemia 11/19/2012  . Insomnia 03/06/2014  . Low back pain 08/19/2015  . Migraine headache 05/19/2013  . Obesity (BMI 30-39.9) 03/28/2013  . Patellofemoral syndrome, bilateral 08/07/2017  . Pilonidal cyst 03/2013  . Pilonidal disease s/p I&D 03/10/2013 11/19/2012  . Rhinitis 08/07/2017  . 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 Thoracic spondylosis without myelopathy 03/23/2017   Past Surgical History: Past Surgical History:  Procedure Laterality Date  . LEFT HEART CATH AND CORONARY ANGIOGRAPHY N/A 12/24/2017   Procedure: LEFT HEART CATH AND CORONARY ANGIOGRAPHY;  Surgeon: Nelva Bush, MD;  Location: [place];  Service: Cardiovascular;  Laterality: N/A;  . NO PAST SURGERIES    . PILONIDAL CYST EXCISION N/A 03/10/2013   Procedure: CYST EXCISION PILONIDAL  SIMPLE I and D ;  Surgeon: Merrie Roof, MD;  Location: [place];  Service: General;  Laterality: N/A;   Social History: Social History   Socioeconomic History  . Marital status: Single    Spouse name: Not on file  . Number of children: Not on file  . Years of education: Not on file  . Highest education level: Not on file  Occupational History  . Not on file  Social Needs  . Financial resource strain: Not on file  . Food insecurity    Worry: Not on file    Inability: Not on file  . Transportation needs    Medical: Not on file    Non-medical: Not on file  Tobacco Use  . Smoking status: Never Smoker  . Smokeless tobacco: Never Used  Substance and Sexual Activity  . Alcohol use: No  . Drug use: No  . Sexual activity: Never  Lifestyle  . Physical activity    Days per week: Not on file    Minutes per session: Not on file  . Stress: Not on file  Relationships  . Social Herbalist on phone: Not on file    Gets together: Not on file    Attends religious service: Not on file    Active member of club or organization: Not on file    Attends meetings of clubs or organizations: Not on file    Relationship status: Not on file  Other Topics Concern  . Not on file  Social History Narrative  . Not on file  Family History: Family History  Problem Relation Age of Onset  . Hyperlipidemia Mother   . Heart disease Mother   . Diabetes Mother   . Hypertension Mother   . Diabetes Maternal Grandmother   . Hyperlipidemia Maternal Grandmother   . Hyperlipidemia Maternal Grandfather    Allergies: No Known Allergies Medications: See med rec.  Review of Systems: No fevers, chills, night sweats, weight loss, chest pain, or shortness of breath.   Objective:    General: Well Developed, well nourished, and in no acute distress.  Neuro: Alert and oriented x3, extra-ocular muscles intact, sensation grossly intact.  HEENT: Normocephalic, atraumatic, pupils  equal round reactive to light, neck supple, no masses, no lymphadenopathy, thyroid nonpalpable.  Skin: Warm and dry, no rashes. Cardiac: Regular rate and rhythm, no murmurs rubs or gallops, no lower extremity edema.  Respiratory: Clear to auscultation bilaterally. Not using accessory muscles, speaking in full sentences.  Impression and Recommendations:    Mood insomnia (HCC) No improvement yet on citalopram 10 and Seroquel 25. Increasing citalopram to 20 mg, Seroquel to 50. He does have some increased aggression, the up titration and Seroquel will help. I did advise Alinda Money that he would need to bear with me here, we need to give both medications the due diligence of a dose titration before switching it up.  Constipation Stools every 2 days, abdominal x-rays, MiraLAX twice daily.    ___________________________________________ Ihor Austin. Benjamin Stain, M.D., ABFM., CAQSM. Primary Care and Sports Medicine [facility]  Adjunct Professor of Family Medicine  [employer]

## 2019-04-29 NOTE — Assessment & Plan Note (Signed)
No improvement yet on citalopram 10 and Seroquel 25. Increasing citalopram to 20 mg, Seroquel to 50. He does have some increased aggression, the up titration and Seroquel will help. I did advise Nicole Kindred that he would need to bear with me here, we need to give both medications the due diligence of a dose titration before switching it up.

## 2019-05-01 NOTE — Telephone Encounter (Signed)
ATC patient unable to reach LM to call back office (x3)  Will route to [facility] to see if we need to send a letter and what we want it to say for patient.

## 2019-05-02 NOTE — Telephone Encounter (Signed)
Sure.   We just need for it to say that we have received information that his insurance will not cover his [place]he Mannan until he had tried Breo in addition to the other medications that he had previously failed. Therefore we have prescribed Breo, and we need to know if his symptoms are uncontrolled because this has to be documented as a failure for [place] to attempt again to have this covered. We want to work with him to try to get his asthma optimally controlled, which may mean we have to appeal his insurance for [facility] again. We are happy to do that once we have gone through this next step. Please keep [place] updated and let us know if there are any problems.  Thanks Lauren!  LCP

## 2019-05-05 ENCOUNTER — Encounter: Payer: Self-pay | Admitting: *Deleted

## 2019-05-05 NOTE — Telephone Encounter (Signed)
Letter created with Dr. Ainsley Spinner recommendations,  Letter signed and placed in mail Nothing further will close message

## 2019-05-16 ENCOUNTER — Telehealth: Payer: Self-pay | Admitting: Critical Care Medicine

## 2019-05-16 NOTE — Telephone Encounter (Signed)
Pt returning call.  [phone].

## 2019-05-16 NOTE — Telephone Encounter (Signed)
ATC patient.  LM on VM to call back. 

## 2019-05-16 NOTE — Telephone Encounter (Signed)
Called and spoke with pt letting him know the Rx that was prev sent to pharmacy had refills added that he should be able to call pharmacy and request a refill. Pt verbalized understanding. Nothing further needed.

## 2019-05-20 ENCOUNTER — Other Ambulatory Visit: Payer: Self-pay | Admitting: Critical Care Medicine

## 2019-05-20 DIAGNOSIS — J4520 Mild intermittent asthma, uncomplicated: Secondary | ICD-10-CM

## 2019-05-21 ENCOUNTER — Other Ambulatory Visit: Payer: Self-pay | Admitting: Sports Medicine

## 2019-05-21 DIAGNOSIS — L649 Androgenic alopecia, unspecified: Secondary | ICD-10-CM

## 2019-05-21 MED ORDER — FINASTERIDE 1 MG PO TABS: 1.0000 mg | ORAL_TABLET | Freq: Every day | ORAL | 3 refills | Status: DC

## 2019-05-24 ENCOUNTER — Other Ambulatory Visit: Payer: Self-pay | Admitting: Sports Medicine

## 2019-05-24 DIAGNOSIS — F39 Unspecified mood [affective] disorder: Secondary | ICD-10-CM

## 2019-06-05 ENCOUNTER — Telehealth: Payer: Self-pay | Admitting: Critical Care Medicine

## 2019-06-05 ENCOUNTER — Other Ambulatory Visit: Payer: Self-pay | Admitting: Critical Care Medicine

## 2019-06-05 DIAGNOSIS — J4530 Mild persistent asthma, uncomplicated: Secondary | ICD-10-CM

## 2019-06-05 MED ORDER — MOMETASONE FURO-FORMOTEROL FUM 200-5 MCG/ACT IN AERO: 2.0000 | INHALATION_SPRAY | Freq: Two times a day (BID) | RESPIRATORY_TRACT | 11 refills | Status: DC

## 2019-06-05 NOTE — Telephone Encounter (Signed)
Raquel Sarna I spoke to his mom and sent the prescription. It's taken care of other than we need to figure out what prior authorization paperwork his insurance company needs.

## 2019-06-05 NOTE — Telephone Encounter (Signed)
Derrick Carter mother is returning phone call.  Derrick Carter phone number is [phone].

## 2019-06-05 NOTE — Telephone Encounter (Signed)
LMTCB

## 2019-06-05 NOTE — Telephone Encounter (Signed)
Mr. Shaneyfelt has been on Breo for 1.5 weeks. He has more SOB, wheezing, stomach aches.  2 recent COVID tests (most recent last week at work).  He has had trouble tolerating LABA/ ICS inhalers in the past other than Dulera. Per my office note on 04/10/2019: previously tried Arnuity, Symbicort, and Advair without control  I am re-prescribing Dulera and expect that his insurance company will send paperwork for a prior authorization.  He should remain on Breo until he is able to get [facility].  His mother indicated she is likely willing to pay out-of-pocket for [facility] given how worried she is about her son's symptoms.  She is afraid he is will end up in the hospital if he does not get back on it soon. Dulera e-prescribed to [place] Naiyah Klostermann, DO 06/05/19 5:06 PM [facility]

## 2019-06-05 NOTE — Telephone Encounter (Signed)
Called and spoke with pt's mother Maudie Mercury who stated she believes pt needs to go back on [place]. Maudie Mercury said that when pt breathes he is beginning to sound like an old man and due to this she wants his inhaler to be switched back.  Dr. Carlis Abbott, please advise if you are okay with this. Thanks!

## 2019-06-09 NOTE — Telephone Encounter (Signed)
We need to do the prior auth for [facility]. His symptoms have not been controlled on Advair or Breo. I have tried him on multiple things. I should have time tomorrow to do a PA.

## 2019-06-16 ENCOUNTER — Other Ambulatory Visit: Payer: Self-pay

## 2019-06-16 ENCOUNTER — Ambulatory Visit: Payer: BC Managed Care – PPO | Admitting: Sports Medicine

## 2019-06-16 ENCOUNTER — Encounter: Payer: Self-pay | Admitting: Sports Medicine

## 2019-06-16 DIAGNOSIS — K5900 Constipation, unspecified: Secondary | ICD-10-CM | POA: Diagnosis not present

## 2019-06-16 DIAGNOSIS — F5105 Insomnia due to other mental disorder: Secondary | ICD-10-CM

## 2019-06-16 DIAGNOSIS — F319 Bipolar disorder, unspecified: Secondary | ICD-10-CM

## 2019-06-16 DIAGNOSIS — F39 Unspecified mood [affective] disorder: Secondary | ICD-10-CM

## 2019-06-16 MED ORDER — QUETIAPINE FUMARATE 100 MG PO TABS: 100.0000 mg | ORAL_TABLET | Freq: Every day | ORAL | 3 refills | Status: DC

## 2019-06-16 NOTE — Progress Notes (Signed)
Subjective:    CC: Follow-up  HPI: Mood disorder: Has noted increased agitation, irritability, flight of ideas, decreased need for sleep, he has been doing some research and wonders if he may have bipolar disorder.  He is currently on citalopram and Seroquel.  Insomnia: We started Seroquel, recently increased to 50 mg for insomnia, he was also having significant flight of ideas, unfortunately has still not noted improvement.  Constipation: Likely related to his tramadol use, increase stool burden on abdominal x-rays, added MiraLAX without much improvement, he went to gastroenterology, they added Linzess, he had a large episode of fecal incontinence in bed.  I reviewed the past medical history, family history, social history, surgical history, and allergies today and no changes were needed.  Please see the problem list section below in epic for further details.  Past Medical History: Past Medical History:  Diagnosis Date  . Acanthosis 08/24/2014  . ADHD (attention deficit hyperactivity disorder)   . Annual physical exam 04/28/2014  . Attention deficit hyperactivity disorder (ADHD) 02/04/2008   Overview:  ADHD, Combined Type  ICD-10 cut over   Last Assessment & Plan:  Relevant Hx: Course: Daily Update: Today's Plan: restart Strattera  . Callus of foot 08/07/2017  . Chest pain 10/30/2017  . Chronic myofascial pain 03/23/2017  . Chronic pain disorder 03/23/2017  . Dental crown present   . Diabetes mellitus without complication (HCC)   . Diabetes mellitus, type 2 (HCC) 04/29/2014  . Hyperlipidemia   . Hypertriglyceridemia 11/19/2012  . Insomnia 03/06/2014  . Low back pain 08/19/2015  . Migraine headache 05/19/2013  . Obesity (BMI 30-39.9) 03/28/2013  . Patellofemoral syndrome, bilateral 08/07/2017  . Pilonidal cyst 03/2013  . Pilonidal disease s/p I&D 03/10/2013 11/19/2012  . Rhinitis 08/07/2017  . 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 Thoracic spondylosis without myelopathy 03/23/2017   Past  Surgical History: Past Surgical History:  Procedure Laterality Date  . LEFT HEART CATH AND CORONARY ANGIOGRAPHY N/A 12/24/2017   Procedure: LEFT HEART CATH AND CORONARY ANGIOGRAPHY;  Surgeon: Yvonne Kendall, MD;  Location: [facility];  Service: Cardiovascular;  Laterality: N/A;  . NO PAST SURGERIES    . PILONIDAL CYST EXCISION N/A 03/10/2013   Procedure: CYST EXCISION PILONIDAL SIMPLE I and D ;  Surgeon: Robyne Askew, MD;  Location: [facility];  Service: General;  Laterality: N/A;   Social History: Social History   Socioeconomic History  . Marital status: Single    Spouse name: Not on file  . Number of children: Not on file  . Years of education: Not on file  . Highest education level: Not on file  Occupational History  . Not on file  Tobacco Use  . Smoking status: Never Smoker  . Smokeless tobacco: Never Used  Substance and Sexual Activity  . Alcohol use: No  . Drug use: No  . Sexual activity: Never  Other Topics Concern  . Not on file  Social History Narrative  . Not on file   Social Determinants of Health   Financial Resource Strain:   . Difficulty of Paying Living Expenses: Not on file  Food Insecurity:   . Worried About Programme researcher, broadcasting/film/video in the Last Year: Not on file  . Ran Out of Food in the Last Year: Not on file  Transportation Needs:   . Lack of Transportation (Medical): Not on file  . Lack of Transportation (Non-Medical): Not on file  Physical Activity:   . Days of Exercise  per Week: Not on file  . Minutes of Exercise per Session: Not on file  Stress:   . Feeling of Stress : Not on file  Social Connections:   . Frequency of Communication with Friends and Family: Not on file  . Frequency of Social Gatherings with Friends and Family: Not on file  . Attends Religious Services: Not on file  . Active Member of Clubs or Organizations: Not on file  . Attends Archivist Meetings: Not on file  . Marital Status: Not on file    Family History: Family History  Problem Relation Age of Onset  . Hyperlipidemia Mother   . Heart disease Mother   . Diabetes Mother   . Hypertension Mother   . Diabetes Maternal Grandmother   . Hyperlipidemia Maternal Grandmother   . Hyperlipidemia Maternal Grandfather    Allergies: No Known Allergies Medications: See med rec.  Review of Systems: No fevers, chills, night sweats, weight loss, chest pain, or shortness of breath.   Objective:    General: Well Developed, well nourished, and in no acute distress.  Neuro: Alert and oriented x3, extra-ocular muscles intact, sensation grossly intact.  HEENT: Normocephalic, atraumatic, pupils equal round reactive to light, neck supple, no masses, no lymphadenopathy, thyroid nonpalpable.  Skin: Warm and dry, no rashes. Cardiac: Regular rate and rhythm, no murmurs rubs or gallops, no lower extremity edema.  Respiratory: Clear to auscultation bilaterally. Not using accessory muscles, speaking in full sentences.  Impression and Recommendations:    Bipolar disorder ([place]) Discontinue citalopram 20, increasing Seroquel to 100 mg, he did have some aggression, insomnia, flight of ideas, increased energy. Because this occasionally does deteriorate into depression and anxiety I do think is likely diagnosis is bipolar 2. Return to see me after 1 month and we will see how Seroquel 100 mg is working. He understands we need to give it the due diligence of full dose titration before switching it out.  Constipation MiraLAX was not effective, he did respond well to Linzess 145 but did have some incontinence of stool. He will talk to his gastroenterologist about dropping to 72. In the meantime I would like him to stop the tramadol.    ___________________________________________ Gwen Her. Dianah Field, M.D., ABFM., CAQSM. Primary Care and Sports Medicine [facility]  Adjunct Professor of [employer]

## 2019-06-16 NOTE — Assessment & Plan Note (Signed)
Discontinue citalopram 20, increasing Seroquel to 100 mg, he did have some aggression, insomnia, flight of ideas, increased energy. Because this occasionally does deteriorate into depression and anxiety I do think is likely diagnosis is bipolar 2. Return to see me after 1 month and we will see how Seroquel 100 mg is working. He understands we need to give it the due diligence of full dose titration before switching it out.

## 2019-06-16 NOTE — Assessment & Plan Note (Signed)
MiraLAX was not effective, he did respond well to Linzess 145 but did have some incontinence of stool. He will talk to his gastroenterologist about dropping to 72. In the meantime I would like him to stop the tramadol.

## 2019-06-30 ENCOUNTER — Other Ambulatory Visit: Payer: Self-pay | Admitting: Sports Medicine

## 2019-06-30 ENCOUNTER — Other Ambulatory Visit: Payer: Self-pay | Admitting: Neurology

## 2019-06-30 DIAGNOSIS — E781 Pure hyperglyceridemia: Secondary | ICD-10-CM

## 2019-06-30 MED ORDER — FENOFIBRATE 160 MG PO TABS: 160.0000 mg | ORAL_TABLET | Freq: Every day | ORAL | 1 refills | Status: AC

## 2019-06-30 MED ORDER — FENOFIBRATE 40 MG PO TABS: 4.0000 | ORAL_TABLET | Freq: Every day | ORAL | 3 refills | Status: DC

## 2019-06-30 MED ORDER — XIGDUO XR 10-1000 MG PO TB24: 1.0000 | ORAL_TABLET | Freq: Every day | ORAL | 3 refills | Status: DC

## 2019-07-08 ENCOUNTER — Telehealth: Payer: Self-pay | Admitting: Critical Care Medicine

## 2019-07-08 NOTE — Telephone Encounter (Signed)
Patient called back - aware that PA has been submitted -pr

## 2019-07-08 NOTE — Telephone Encounter (Signed)
Spoke with pt, he states the insurance will not cover Dulera. I called the pharmacy and they gave me the alternatives but pt states none of the alternatives work for him. I attempted a PA on covermymeds. Will await decision. I called pt to let him know but there was no answer. I left message for him to call back.   Zyrell Carlin (Key: [id number])  Your information has been submitted to [employer]. To check for an updated outcome later, reopen this PA request from your dashboard.  If [employer] has not responded to your request within 24 hours, contact [employer] at [phone]. If you think there may be a problem with your PA request, use our live chat feature at the bottom right.  [phone]   Advair  Breo  Symbicort  PA Requirement Symbicort 160-4.5 Mcg/Actuation HFAA, PA Not Required Breo Ellipta 100-25 Mcg/Dose Dsdv, PA Not Required Advair HFA 115-21 Mcg/Actuation HFAA, PA Not Required Ernestina Patches, PA Not Required Trelegy Ellipta 100-62.5-25 Mcg Dsdv, PA Not Required Flovent HFA 110 Mcg/Actuation HFAA, PA Not Required Phillip Heal, PA Not Required Serevent Diskus, PA Not Required Combivent Respimat, PA Not Required Mometasone Furoate (Bulk),

## 2019-07-09 ENCOUNTER — Other Ambulatory Visit: Payer: Self-pay | Admitting: Sports Medicine

## 2019-07-09 DIAGNOSIS — F319 Bipolar disorder, unspecified: Secondary | ICD-10-CM

## 2019-07-09 DIAGNOSIS — F5105 Insomnia due to other mental disorder: Secondary | ICD-10-CM

## 2019-07-09 DIAGNOSIS — F39 Unspecified mood [affective] disorder: Secondary | ICD-10-CM

## 2019-07-09 NOTE — Telephone Encounter (Signed)
PA for Derrick Carter has been denied. Covered alternatives:  Symbicort, Breo, Advair HFA, Anoro, Trelegy, Flovent HFA, Incruse, Serevent.   Per chart I do no see where pt has tried any other inhaler besides Dulera.  Dr. Chestine Spore please advise on which covered alternative you prefer for this patient.  Thanks!

## 2019-07-09 NOTE — Telephone Encounter (Signed)
Pt's mother Selena Batten called back & can be reached at [phone].

## 2019-07-09 NOTE — Telephone Encounter (Signed)
Attempted to call pt's mother Selena Batten to speak with her about preference with inhalers but unable to reach her.  Left message for her to return call.

## 2019-07-09 NOTE — Telephone Encounter (Signed)
lmtcb for pt's mother Selena Batten.

## 2019-07-09 NOTE — Telephone Encounter (Signed)
He has. It is in my assessment & plan of a few of my notes. His mother does not think anything else works, so the best person to ask if her. I am fine with any high dose ICS-LABA. My preference would be advair 500-50 if she does not care.  Steffanie Dunn, DO 07/09/19 3:59 PM [facility]

## 2019-07-14 NOTE — Telephone Encounter (Signed)
[phone] Selena Batten calling back

## 2019-07-14 NOTE — Telephone Encounter (Signed)
lmtcb for [employer].    Per Dr. Ophelia Charter notes: Mild intermittent asthma-currently controlled -Continue Dulera twice daily.  Will fill out prior authorization as he has previously failed on Arnuity, Symbicort, and Advair

## 2019-07-14 NOTE — Telephone Encounter (Signed)
I called and spoke with mother Selena Batten and she states that she does not want him to try any other inhaler because only the [facility] works for the patient. I explained to her that we recommend her try the Advair and she refused saying that nothing else will work. Please advise.

## 2019-07-15 NOTE — Telephone Encounter (Signed)
I expected this. I am not sure what other options we have to try to appeal to his insurance company. He/ his mother are more than welcome to continue filling his Dulera and pay for it out of pocket. I would love to have his insurance cover this, but I am not sure how to accomplish this.  LPC

## 2019-07-16 NOTE — Telephone Encounter (Signed)
ATC patient unable to reach left message to call back  °

## 2019-07-17 NOTE — Telephone Encounter (Signed)
LMTCB

## 2019-07-18 NOTE — Telephone Encounter (Signed)
LMTCB

## 2019-07-21 ENCOUNTER — Ambulatory Visit: Payer: BC Managed Care – PPO | Admitting: Sports Medicine

## 2019-07-21 NOTE — Telephone Encounter (Signed)
Pt's mother called back and I advised her about the denial. She states the pt has tried all the alternatives and none of them work. She would like for [place] to start an appeal. We can write a letter and state that he has tried all the alternatives and they weren't successful. We can add anything that may help with the appeal. We can write letter, Dr. Chestine Spore is there anything else we should include in the letter? Please advise.    Letter from insurance:  Your plan approved Dulera (FA-PA) criteria covers this drug when you have a contraindication to all the alternatives, or if complete and valid documentation is provided of you trying and having an inadequate treatment response or intolerance to the required number of formulary alternatives. Your use of this drug does not meet the requirement. This is based on the information that was provided. Formulary alternatives are: Advair Diskus, Advair HFA, Breo Ellipta, Symbicort. Requirement: Trial and failure of 3 or more in a class with at least 3 alternatives, 2 in a class with 2 alternatives, or 1 in a class with only 1 alternative. If you would still like to request coverage for this prescription drug, you or your provider may submit another prior authorization request, along with the necessary clinical information, to [facility] by mail, phone or fax to: This document contains references to brand-name prescription drugs that are trademarks or registered trademarks of pharmaceutical manufacturers not affiliated with [facility] . Your privacy is important to [place]. Our employees are trained regarding the appropriate way to handle your private health information. [id number] TDD/TTY: [phone] [facility] [employer] Prior Authorization (Commercial) [address] [place], [place] [address] Phone: [phone] Fax: [phone]

## 2019-07-21 NOTE — Telephone Encounter (Signed)
I wrote letter for appeals and printed the last OV note and faxed it to [facility] [employer] at [phone]. Will await decision.    Woodson Kumpf [employer] - PA Case ID: [id number] Need help? Call us at [phone] Status Sent to Plan today Drug Dulera 200-5MCG/ACT IN [employer] Electronic PA Form (NCPDP)

## 2019-07-21 NOTE — Telephone Encounter (Signed)
I agree. Please tell them which alternatives we have tried (it is in my clinic notes which inhalers he has tried in the past) and that his symptoms are not controlled with inhalers other than Dulera. Thanks for doing this!  LPC

## 2019-07-24 NOTE — Telephone Encounter (Signed)
Mandi, I gave you this paperwork for Dr. Chestine Spore to sign. Did you fax it to the appeals department?

## 2019-07-25 NOTE — Telephone Encounter (Signed)
Paperwork placed in Dr. Burna Forts folder to review/sign. She is back in the office on 1/26.

## 2019-07-30 NOTE — Telephone Encounter (Signed)
Mandi, was Dr. Chestine Spore able to sign this yesterday? Thanks.

## 2019-07-30 NOTE — Telephone Encounter (Signed)
Dr. Chestine Spore just signed the letter. Paperwork is ready to be faxed. I will fax tomorrow morning.

## 2019-07-31 NOTE — Telephone Encounter (Signed)
Paperwork was faxed this morning to [facility] [employer].  Waiting on Approval

## 2019-08-05 ENCOUNTER — Ambulatory Visit: Payer: BC Managed Care – PPO | Admitting: Sports Medicine

## 2019-08-12 ENCOUNTER — Other Ambulatory Visit: Payer: Self-pay | Admitting: Sports Medicine

## 2019-08-12 DIAGNOSIS — F319 Bipolar disorder, unspecified: Secondary | ICD-10-CM

## 2019-08-12 DIAGNOSIS — F5105 Insomnia due to other mental disorder: Secondary | ICD-10-CM

## 2019-08-12 DIAGNOSIS — F39 Unspecified mood [affective] disorder: Secondary | ICD-10-CM

## 2019-08-19 ENCOUNTER — Ambulatory Visit: Payer: BC Managed Care – PPO | Admitting: Sports Medicine

## 2019-09-02 ENCOUNTER — Other Ambulatory Visit: Payer: Self-pay

## 2019-09-02 ENCOUNTER — Ambulatory Visit (INDEPENDENT_AMBULATORY_CARE_PROVIDER_SITE_OTHER): Payer: BC Managed Care – PPO | Admitting: Sports Medicine

## 2019-09-02 ENCOUNTER — Encounter: Payer: Self-pay | Admitting: Sports Medicine

## 2019-09-02 DIAGNOSIS — F319 Bipolar disorder, unspecified: Secondary | ICD-10-CM

## 2019-09-02 MED ORDER — LURASIDONE HCL 40 MG PO TABS: 40.0000 mg | ORAL_TABLET | Freq: Every day | ORAL | 3 refills | Status: DC

## 2019-09-02 NOTE — Progress Notes (Signed)
    Procedures performed today:    None.  Independent interpretation of notes and tests performed by another provider:   None.  Impression and Recommendations:    Bipolar disorder [facility]) Derrick Carter has what I think is bipolar 2 disorder, he did extremely well with Seroquel, this improved his insomnia, aggression, flight of ideas. We had discontinued citalopram but unfortunately he is having some mild depressive symptoms without suicidal or homicidal ideation. He is interested in trying Latuda, discontinue Seroquel, switching to [place]. Certainly if this is too expensive or does not work we will restart Seroquel and likely add Cymbalta as well.    ___________________________________________ Ihor Austin. Benjamin Stain, M.D., ABFM., CAQSM. Primary Care and Sports Medicine [facility]  Adjunct Instructor of Family Medicine  [employer]

## 2019-09-02 NOTE — Assessment & Plan Note (Signed)
Derrick Carter has what I think is bipolar 2 disorder, he did extremely well with Seroquel, this improved his insomnia, aggression, flight of ideas. We had discontinued citalopram but unfortunately he is having some mild depressive symptoms without suicidal or homicidal ideation. He is interested in trying Latuda, discontinue Seroquel, switching to [place]. Certainly if this is too expensive or does not work we will restart Seroquel and likely add Cymbalta as well.

## 2019-09-04 ENCOUNTER — Telehealth: Payer: Self-pay | Admitting: Sports Medicine

## 2019-09-04 NOTE — Telephone Encounter (Signed)
Received fax from [facility] and they authorized Latuda tabsl from 09/02/19 - 09/02/22. - CF

## 2019-09-18 ENCOUNTER — Encounter: Payer: Self-pay | Admitting: Critical Care Medicine

## 2019-09-18 ENCOUNTER — Ambulatory Visit: Payer: BC Managed Care – PPO | Admitting: Critical Care Medicine

## 2019-09-18 ENCOUNTER — Other Ambulatory Visit: Payer: Self-pay

## 2019-09-18 VITALS — BP 120/78 | HR 79 | Temp 97.3°F | Ht 71.0 in | Wt 201.0 lb

## 2019-09-18 DIAGNOSIS — J4540 Moderate persistent asthma, uncomplicated: Secondary | ICD-10-CM

## 2019-09-18 DIAGNOSIS — J00 Acute nasopharyngitis [common cold]: Secondary | ICD-10-CM | POA: Diagnosis not present

## 2019-09-18 DIAGNOSIS — J0101 Acute recurrent maxillary sinusitis: Secondary | ICD-10-CM | POA: Diagnosis not present

## 2019-09-18 MED ORDER — TIOTROPIUM BROMIDE MONOHYDRATE 18 MCG IN CAPS: 18.0000 ug | ORAL_CAPSULE | Freq: Every day | RESPIRATORY_TRACT | 11 refills | Status: DC

## 2019-09-18 MED ORDER — LORATADINE 10 MG PO TABS: 10.0000 mg | ORAL_TABLET | Freq: Every day | ORAL | 3 refills | Status: DC

## 2019-09-18 MED ORDER — FLUTICASONE PROPIONATE 50 MCG/ACT NA SUSP: NASAL | 3 refills | Status: AC

## 2019-09-18 NOTE — Patient Instructions (Addendum)
Thank you for visiting Dr. Chestine Spore at [facility]. We recommend the following: Orders Placed This Encounter  Procedures  . IgE  . CBC w/Diff  . Pulmonary function test   Orders Placed This Encounter  Procedures  . IgE    Standing Status:   Future    Standing Expiration Date:   09/17/2020  . CBC w/Diff    Standing Status:   Future    Standing Expiration Date:   09/17/2020  . Pulmonary function test    Standing Status:   Future    Standing Expiration Date:   09/17/2020    Order Specific Question:   Where should this test be performed?    Answer:   [facility]    Order Specific Question:   Full PFT: includes the following: basic spirometry, spirometry pre & post bronchodilator, diffusion capacity (DLCO), lung volumes    Answer:   Full PFT    Meds ordered this encounter  Medications  . tiotropium (SPIRIVA) 18 MCG inhalation capsule    Sig: Place 1 capsule (18 mcg total) into inhaler and inhale daily.    Dispense:  30 capsule    Refill:  11  . fluticasone (FLONASE) 50 MCG/ACT nasal spray    Sig: One spray in each nostril twice a day, use left hand for right nostril, and right hand for left nostril.    Dispense:  48 g    Refill:  3  . loratadine (CLARITIN) 10 MG tablet    Sig: Take 1 tablet (10 mg total) by mouth daily.    Dispense:  90 tablet    Refill:  3    Return in about 2 months (around 11/18/2019).    Please do your part to reduce the spread of COVID-19.

## 2019-09-18 NOTE — Telephone Encounter (Signed)
Patient was seen in office today. Switched from [place] to Spiriva. Patient stated that the [facility] wasn't working for him for more than 2 hours. Patient will follow up in 2 months nothing further needed at this time.

## 2019-09-18 NOTE — Progress Notes (Signed)
Synopsis: Referred in August 2019 for shortness of breath by Silverio Decamp,*.  Previously patient of Dr. Lake Bells.  Subjective:   PATIENT ID: Derrick Carter GENDER: male DOB: Nov 04, 1990, MRN: [id number]  Chief Complaint  Patient presents with  . Follow-up    Patient states that the [facility] only works for about 2 hours and then feels like it wears off. Patient states that his asthma has got worse after her got the covid shot 2/23.      Mr. Kader is a 29 year old gentleman who presents for follow-up of asthma.  He is accompanied by his mother.  His breathing has been worse since his Covid shot in February.  He has a raspy sounding voice that is better when he is off work for a few days.  He denies nasal symptoms and postnasal drip.  He is no longer taking Claritin and Flonase.  He continues on Dulera 200 twice daily, but feels that it wears off after 2 hours, where it previously was lasting 6 hours at a time.  He has used his albuterol twice, without benefit.  He feels that his symptoms worsened after his Covid vaccine in February.  He describes it as feeling like he cannot catch his breath and wheezing.  He felt like he could not go to work today due to shortness of breath.  No heartburn or history of GERD.  No recent medication changes.    OV 04/10/2019: Mr. Mengel is a 29 year old gentleman who presents for follow-up of chronic asthma.  He is accompanied by his mother to this visit today.  Recently he has been doing well on Dulera, but his insurance does not cover it and he is paying over $300 out-of-pocket.  He has previously tried Arnuity, Symbicort, and Advair with severe recurrence of symptoms.  He denies dyspnea on exertion, activity limitation, coughing, wheezing.  He uses his albuterol inhaler every day twice daily at baseline-once in the morning and once before work, not due to symptoms.  He has never been hospitalized for his asthma or required systemic steroids for exacerbations.   He works for the [employer], and has been compliant with mask wearing.  He has been very cautious knowing that he is at risk for exposure to COVID-19.  He has not yet had his flu vaccination.  Currently his allergies are well controlled.  During the winter he is able to come off of his Claritin and Flonase.  He has not undergone allergy testing in the past.  He denies heartburn.   Past Medical History:  Diagnosis Date  . Acanthosis 08/24/2014  . ADHD (attention deficit hyperactivity disorder)   . Annual physical exam 04/28/2014  . Attention deficit hyperactivity disorder (ADHD) 02/04/2008   Overview:  ADHD, Combined Type  ICD-10 cut over   Last Assessment & Plan:  Relevant Hx: Course: Daily Update: Today's Plan: restart Strattera  . Callus of foot 08/07/2017  . Chest pain 10/30/2017  . Chronic myofascial pain 03/23/2017  . Chronic pain disorder 03/23/2017  . Dental crown present   . Diabetes mellitus without complication ([place])   . Diabetes mellitus, type 2 ([place]) 04/29/2014  . Hyperlipidemia   . Hypertriglyceridemia 11/19/2012  . Insomnia 03/06/2014  . Low back pain 08/19/2015  . Migraine headache 05/19/2013  . Obesity (BMI 30-39.9) 03/28/2013  . Patellofemoral syndrome, bilateral 08/07/2017  . Pilonidal cyst 03/2013  . Pilonidal disease s/p I&D 03/10/2013 11/19/2012  . Rhinitis 08/07/2017  . 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  Thoracic spondylosis without myelopathy 03/23/2017     Family History  Problem Relation Age of Onset  . Hyperlipidemia Mother   . Heart disease Mother   . Diabetes Mother   . Hypertension Mother   . Diabetes Maternal Grandmother   . Hyperlipidemia Maternal Grandmother   . Hyperlipidemia Maternal Grandfather      Past Surgical History:  Procedure Laterality Date  . LEFT HEART CATH AND CORONARY ANGIOGRAPHY N/A 12/24/2017   Procedure: LEFT HEART CATH AND CORONARY ANGIOGRAPHY;  Surgeon: Nelva Bush, MD;  Location: [place] [facility];  Service:  Cardiovascular;  Laterality: N/A;  . NO PAST SURGERIES    . PILONIDAL CYST EXCISION N/A 03/10/2013   Procedure: CYST EXCISION PILONIDAL SIMPLE I and D ;  Surgeon: Merrie Roof, MD;  Location: [place];  Service: General;  Laterality: N/A;    Social History   Socioeconomic History  . Marital status: Single    Spouse name: Not on file  . Number of children: Not on file  . Years of education: Not on file  . Highest education level: Not on file  Occupational History  . Not on file  Tobacco Use  . Smoking status: Never Smoker  . Smokeless tobacco: Never Used  Substance and Sexual Activity  . Alcohol use: No  . Drug use: No  . Sexual activity: Never  Other Topics Concern  . Not on file  Social History Narrative  . Not on file   Social Determinants of Health   Financial Resource Strain:   . Difficulty of Paying Living Expenses:   Food Insecurity:   . Worried About Charity fundraiser in the Last Year:   . Arboriculturist in the Last Year:   Transportation Needs:   . Film/video editor (Medical):   Marland Kitchen Lack of Transportation (Non-Medical):   Physical Activity:   . Days of Exercise per Week:   . Minutes of Exercise per Session:   Stress:   . Feeling of Stress :   Social Connections:   . Frequency of Communication with Friends and Family:   . Frequency of Social Gatherings with Friends and Family:   . Attends Religious Services:   . Active Member of Clubs or Organizations:   . Attends Archivist Meetings:   Marland Kitchen Marital Status:   Intimate Partner Violence:   . Fear of Current or Ex-Partner:   . Emotionally Abused:   Marland Kitchen Physically Abused:   . Sexually Abused:      No Known Allergies   Immunization History  Administered Date(s) Administered  . DTaP 07/13/1992, 09/26/1992, 11/12/1992, 11/25/1993, 07/15/1996  . HPV Quadrivalent 04/13/2010, 10/21/2010, 09/06/2011  . Hepatitis A, Ped/Adol-2 Dose 01/24/2006, 08/21/2006  . Hepatitis B, ped/adol  October 10, 1990, 07/13/1992, 11/12/1992  . HiB (PRP-OMP) 07/13/1992, 09/21/1992, 11/12/1992, 08/15/1993  . IPV 07/13/1992, 09/21/1992, 11/25/1993, 07/15/1996  . Influenza Nasal 03/31/2011  . Influenza,Quad,Nasal, Live 04/16/2012  . Influenza,inj,Quad PF,6+ Mos 05/07/2013, 04/28/2014, 04/01/2015, 05/01/2016, 03/25/2017, 03/28/2018, 04/10/2019  . Influenza,inj,quad, With Preservative 05/20/2008, 04/22/2009, 04/13/2010  . Influenza-Unspecified 05/20/2008, 04/22/2009, 04/13/2010  . MMR 08/15/1993, 07/15/1996  . Meningococcal Conjugate 01/24/2006, 04/13/2010  . Moderna SARS-COVID-2 Vaccination 07/23/2019, 08/26/2019  . Pneumococcal Polysaccharide-23 05/05/2014, 04/10/2019  . Td 05/19/2004, 02/11/2008  . Tdap 02/11/2008, 11/16/2015  . Varicella 05/12/1994    Outpatient Medications Prior to Visit  Medication Sig Dispense Refill  . albuterol (PROVENTIL) (2.5 MG/3ML) 0.083% nebulizer solution Take 3 mLs (2.5 mg total) by nebulization every 6 (  six) hours as needed for wheezing or shortness of breath. 360 mL 5  . albuterol (VENTOLIN HFA) 108 (90 Base) MCG/ACT inhaler Inhale 2 puffs into the lungs every 6 (six) hours as needed for wheezing or shortness of breath. 6.7 g 11  . atorvastatin (LIPITOR) 40 MG tablet Take 1 tablet (40 mg total) by mouth daily at 6 PM. 90 tablet 1  . Dapagliflozin-metFORMIN HCl ER (XIGDUO XR) 04-999 MG TB24 Take 1 tablet by mouth daily. 90 tablet 3  . fenofibrate 160 MG tablet Take 1 tablet (160 mg total) by mouth daily. 90 tablet 1  . finasteride (PROPECIA) 1 MG tablet Take 1 tablet (1 mg total) by mouth daily. 90 tablet 3  . lurasidone (LATUDA) 40 MG TABS tablet Take 1 tablet (40 mg total) by mouth daily with breakfast. 30 tablet 3  . minoxidil (MINOXIDIL FOR MEN) 2 % external solution Apply topically 2 (two) times daily. 60 mL 11  . mometasone-formoterol (DULERA) 200-5 MCG/ACT AERO Inhale 2 puffs into the lungs 2 (two) times daily. 13 g 11  . polyethylene glycol (MIRALAX /  GLYCOLAX) 17 g packet Take 17 g by mouth 2 (two) times daily. Until stooling regularly 30 packet 11  . fluticasone (FLONASE) 50 MCG/ACT nasal spray One spray in each nostril twice a day, use left hand for right nostril, and right hand for left nostril. 48 g 3  . loratadine (CLARITIN) 10 MG tablet TAKE 1 TABLET BY MOUTH EVERY DAY 90 tablet 0  . traMADol (ULTRAM) 50 MG tablet Take 2 tablets (100 mg total) by mouth every 12 (twelve) hours as needed for moderate pain. 120 tablet 0   No facility-administered medications prior to visit.    Review of Systems  Constitutional: Negative for chills, fever and weight loss.  HENT: Negative for congestion and sore throat.   Eyes: Negative.   Respiratory: Negative for cough, hemoptysis, sputum production and wheezing.   Cardiovascular: Negative for chest pain and leg swelling.  Gastrointestinal: Negative for heartburn, nausea and vomiting.  Genitourinary: Negative.   Musculoskeletal: Negative.   Skin: Negative for rash.  Neurological: Negative.   Endo/Heme/Allergies: Positive for environmental allergies.     Objective:   Vitals:   09/18/19 1639  BP: 120/78  Pulse: 79  Temp: (!) 97.3 F (36.3 C)  TempSrc: Temporal  SpO2: 97%  Weight: 201 lb (91.2 kg)  Height: _0  (1.803 m)   97% on  RA BMI Readings from Last 3 Encounters:  09/18/19 28.03 kg/m  09/02/19 27.89 kg/m  06/16/19 27.48 kg/m   Wt Readings from Last 3 Encounters:  09/18/19 201 lb (91.2 kg)  09/02/19 200 lb (90.7 kg)  06/16/19 197 lb (89.4 kg)    Physical Exam Vitals reviewed.  Constitutional:      Appearance: He is not ill-appearing.  HENT:     Head: Normocephalic and atraumatic.     Nose:     Comments: Deferred due to masking requirement.    Mouth/Throat:     Comments: Deferred due to masking requirement. Eyes:     General: No scleral icterus. Cardiovascular:     Rate and Rhythm: Normal rate and regular rhythm.     Heart sounds: No murmur.  Pulmonary:      Comments: Breathing comfortably on room air, no conversational dyspnea.  No witnessed coughing.  Clear to auscultation bilaterally. Abdominal:     General: There is no distension.     Palpations: Abdomen is soft.     Tenderness:  There is no abdominal tenderness.  Musculoskeletal:        General: No swelling or deformity.     Cervical back: Neck supple.  Lymphadenopathy:     Cervical: No cervical adenopathy.  Skin:    General: Skin is warm and dry.     Findings: No rash.  Neurological:     General: No focal deficit present.     Mental Status: He is alert.     Motor: No weakness.     Coordination: Coordination normal.  Psychiatric:        Mood and Affect: Mood normal.        Behavior: Behavior normal.      CBC    Component Value Date/Time   WBC 7.6 12/30/2018 1534   RBC 5.24 12/30/2018 1534   HGB 15.8 12/30/2018 1534   HGB 15.9 12/13/2017 1140   HCT 48.0 12/30/2018 1534   HCT 48.7 12/13/2017 1140   PLT 362 12/30/2018 1534   PLT 322 12/13/2017 1140   MCV 91.6 12/30/2018 1534   MCV 93 12/13/2017 1140   MCH 30.2 12/30/2018 1534   MCHC 32.9 12/30/2018 1534   RDW 12.4 12/30/2018 1534   RDW 12.8 12/13/2017 1140   LYMPHSABS 3.0 08/19/2015 1155   MONOABS 0.8 08/19/2015 1155   EOSABS 0.1 08/19/2015 1155   BASOSABS 0.0 08/19/2015 1155    CHEMISTRY CMP Latest Ref Rng & Units 12/30/2018 07/04/2018 01/02/2018  Glucose 65 - 99 mg/dL 128(H) 223(H) 100(H)  BUN 7 - 25 mg/dL _0 Creatinine 0.60 - 1.35 mg/dL 0.89 0.96 0.97  Sodium 135 - 146 mmol/L 142 138 139  Potassium 3.5 - 5.3 mmol/L 4.1 4.1 4.0  Chloride 98 - 110 mmol/L 107 101 99  CO2 20 - 32 mmol/L _1 Calcium 8.6 - 10.3 mg/dL 10.3 10.3 9.8  Total Protein 6.1 - 8.1 g/dL 7.3 7.9 -  Total Bilirubin 0.2 - 1.2 mg/dL 0.6 0.8 -  Alkaline Phos 40 - 115 U/L - - -  AST 10 - 40 U/L 23 30 -  ALT 9 - 46 U/L 32 48(H) -     Chest Imaging- films reviewed: CT chest high-resolution 11/27/2017- no ILD or other  abnormalities  Pulmonary Functions Testing Results: PFT Results Latest Ref Rng & Units 02/05/2018  FVC-Pre L 4.56  FVC-Predicted Pre % 90  FVC-Post L 4.48  FVC-Predicted Post % 88  Pre FEV1/FVC % % 81  Post FEV1/FCV % % 85  FEV1-Pre L 3.68  FEV1-Predicted Pre % 87  FEV1-Post L 3.81  DLCO UNC% % 112  DLCO COR %Predicted % 120  TLC L 6.84  TLC % Predicted % 108  RV % Predicted % 126  No significant obstruction or bronchodilator reversibility. Normal lung capacity and diffusion capacity.  Small airways disease.  May 2019 spirometry test from primary care: Ratio normal, forced vital capacity 4.1 L (73% predicted)  Echocardiogram: July 2019 transthoracic echocardiogram showed an LVEF of 60 to 65% and otherwise normal parameters  Heart Catheterization June 2019- left heart catheterization showed no evidence of coronary artery disease, normal LVEF with mildly elevated left ventricular end-diastolic pressure    Assessment & Plan:     ICD-10-CM   1. Moderate persistent asthma, unspecified whether complicated  L49.17 Pulmonary function test    IgE    CBC w/Diff  2. Acute recurrent maxillary sinusitis  J01.01 fluticasone (FLONASE) 50 MCG/ACT nasal spray  3. Acute rhinitis  J00 loratadine (CLARITIN) 10 MG  tablet    Moderate persistent asthma-currently uncontrolled per his report -Continue Dulera twice daily.  -Add Spiriva once daily -Resume Claritin and Flonase daily -Repeat PFTs as physical exam seems discordant with his symptoms.  I am concerned that he may have something else contributing to his symptoms, especially as he has no benefit from using albuterol. -Continue albuterol as needed -Up-to-date on flu shot and pneumococcal 23 vaccination -Glad that he received his Covid vaccine.  Discussed the importance of ongoing vigilance with mask wearing.  Allergic rhinosinusitis -Resume daily use of Flonase and Claritin    RTC in 6 months.   Current Outpatient Medications:  .   albuterol (PROVENTIL) (2.5 MG/3ML) 0.083% nebulizer solution, Take 3 mLs (2.5 mg total) by nebulization every 6 (six) hours as needed for wheezing or shortness of breath., Disp: 360 mL, Rfl: 5 .  albuterol (VENTOLIN HFA) 108 (90 Base) MCG/ACT inhaler, Inhale 2 puffs into the lungs every 6 (six) hours as needed for wheezing or shortness of breath., Disp: 6.7 g, Rfl: 11 .  atorvastatin (LIPITOR) 40 MG tablet, Take 1 tablet (40 mg total) by mouth daily at 6 PM., Disp: 90 tablet, Rfl: 1 .  Dapagliflozin-metFORMIN HCl ER (XIGDUO XR) 04-999 MG TB24, Take 1 tablet by mouth daily., Disp: 90 tablet, Rfl: 3 .  fenofibrate 160 MG tablet, Take 1 tablet (160 mg total) by mouth daily., Disp: 90 tablet, Rfl: 1 .  finasteride (PROPECIA) 1 MG tablet, Take 1 tablet (1 mg total) by mouth daily., Disp: 90 tablet, Rfl: 3 .  fluticasone (FLONASE) 50 MCG/ACT nasal spray, One spray in each nostril twice a day, use left hand for right nostril, and right hand for left nostril., Disp: 48 g, Rfl: 3 .  loratadine (CLARITIN) 10 MG tablet, Take 1 tablet (10 mg total) by mouth daily., Disp: 90 tablet, Rfl: 3 .  lurasidone (LATUDA) 40 MG TABS tablet, Take 1 tablet (40 mg total) by mouth daily with breakfast., Disp: 30 tablet, Rfl: 3 .  minoxidil (MINOXIDIL FOR MEN) 2 % external solution, Apply topically 2 (two) times daily., Disp: 60 mL, Rfl: 11 .  mometasone-formoterol (DULERA) 200-5 MCG/ACT AERO, Inhale 2 puffs into the lungs 2 (two) times daily., Disp: 13 g, Rfl: 11 .  polyethylene glycol (MIRALAX / GLYCOLAX) 17 g packet, Take 17 g by mouth 2 (two) times daily. Until stooling regularly, Disp: 30 packet, Rfl: 11 .  tiotropium (SPIRIVA) 18 MCG inhalation capsule, Place 1 capsule (18 mcg total) into inhaler and inhale daily., Disp: 30 capsule, Rfl: 11   Julian Hy, DO [facility] 09/18/2019 5:37 PM

## 2019-09-24 ENCOUNTER — Other Ambulatory Visit (INDEPENDENT_AMBULATORY_CARE_PROVIDER_SITE_OTHER): Payer: BC Managed Care – PPO

## 2019-09-24 DIAGNOSIS — J4540 Moderate persistent asthma, uncomplicated: Secondary | ICD-10-CM

## 2019-09-24 LAB — CBC WITH DIFFERENTIAL/PLATELET
Basophils Absolute: 0 10*3/uL (ref 0.0–0.1)
Basophils Relative: 0.2 % (ref 0.0–3.0)
Eosinophils Absolute: 0.1 10*3/uL (ref 0.0–0.7)
Eosinophils Relative: 0.6 % (ref 0.0–5.0)
HCT: 45.9 % (ref 39.0–52.0)
Hemoglobin: 15.5 g/dL (ref 13.0–17.0)
Lymphocytes Relative: 21.1 % (ref 12.0–46.0)
Lymphs Abs: 2.2 10*3/uL (ref 0.7–4.0)
MCHC: 33.6 g/dL (ref 30.0–36.0)
MCV: 92.4 fl (ref 78.0–100.0)
Monocytes Absolute: 0.7 10*3/uL (ref 0.1–1.0)
Monocytes Relative: 6.8 % (ref 3.0–12.0)
Neutro Abs: 7.3 10*3/uL (ref 1.4–7.7)
Neutrophils Relative %: 71.3 % (ref 43.0–77.0)
Platelets: 307 10*3/uL (ref 150.0–400.0)
RBC: 4.97 Mil/uL (ref 4.22–5.81)
RDW: 12.3 % (ref 11.5–15.5)
WBC: 10.3 10*3/uL (ref 4.0–10.5)

## 2019-09-25 LAB — IGE: IgE (Immunoglobulin E), Serum: 31 kU/L (ref <=114)

## 2019-09-25 NOTE — Progress Notes (Signed)
Please let Mr. Chizmar know that his antibody level and WBC level associated with asthma are both normal. Thanks!

## 2019-10-07 ENCOUNTER — Telehealth: Payer: Self-pay | Admitting: Critical Care Medicine

## 2019-10-08 ENCOUNTER — Ambulatory Visit: Payer: BC Managed Care – PPO | Admitting: Critical Care Medicine

## 2019-10-08 NOTE — Telephone Encounter (Signed)
lmtcb for pt.  

## 2019-10-10 NOTE — Telephone Encounter (Signed)
Attempted to call pt but unable to reach. Left message for pt to return call. 

## 2019-10-14 NOTE — Telephone Encounter (Signed)
LMTCB x3 for pt. We have attempted to contact pt several times with no success or call back from pt. Per triage protocol, message will be closed.   

## 2019-10-15 ENCOUNTER — Other Ambulatory Visit: Payer: Self-pay | Admitting: Sports Medicine

## 2019-10-15 DIAGNOSIS — E781 Pure hyperglyceridemia: Secondary | ICD-10-CM

## 2019-10-20 ENCOUNTER — Ambulatory Visit: Payer: BC Managed Care – PPO | Admitting: Critical Care Medicine

## 2019-10-20 ENCOUNTER — Encounter: Payer: Self-pay | Admitting: Critical Care Medicine

## 2019-10-20 ENCOUNTER — Other Ambulatory Visit: Payer: Self-pay

## 2019-10-20 VITALS — BP 118/70 | HR 90 | Temp 97.8°F | Ht 71.0 in | Wt 200.6 lb

## 2019-10-20 DIAGNOSIS — J309 Allergic rhinitis, unspecified: Secondary | ICD-10-CM

## 2019-10-20 DIAGNOSIS — J4550 Severe persistent asthma, uncomplicated: Secondary | ICD-10-CM | POA: Diagnosis not present

## 2019-10-20 MED ORDER — MONTELUKAST SODIUM 10 MG PO TABS: 10.0000 mg | ORAL_TABLET | Freq: Every day | ORAL | 11 refills | Status: DC

## 2019-10-20 NOTE — Patient Instructions (Addendum)
Thank you for visiting Dr. Chestine Spore at [facility]. We recommend the following:  Keep using Dulera twice daily. Ok to stop Spiriva. Use albuterol as needed for uncontrolled symptoms. You can use it every 4-6 hours as needed for symptoms.   Meds ordered this encounter  Medications  . montelukast (SINGULAIR) 10 MG tablet    Sig: Take 1 tablet (10 mg total) by mouth at bedtime.    Dispense:  30 tablet    Refill:  11    Return in about 4 weeks (around 11/17/2019).    Please do your part to reduce the spread of COVID-19.

## 2019-10-20 NOTE — Progress Notes (Signed)
Synopsis: Referred in August 2019 for shortness of breath by Silverio Decamp,*.  Previously patient of Dr. Lake Bells.  Subjective:   PATIENT ID: Derrick Carter GENDER: male DOB: [birth year], MRN: [id number]  Chief Complaint  Patient presents with  . Follow-up    Patient feels the same since last visit. Patient states that Spiriva didn't make a difference.     Derrick Carter is a 29 y/o gentleman with a history of asthma who presents for follow up. He is accompanied by his mother to today's visit. He reports ongoing poorly controlled asthma and did not noticed a benefit from starting Spiriva once daily. He continues to use Dulera twice daily, but feels like it wears off after 2 hours. He does not use his albuterol because it does not improve his symptoms. He has started using Flonase and Claritin daily again. He feels like he is worse shortness of breath and wheezing and cough throughout the day. He has some coughing with occasional sputum. He wakes up at night coughing. He denies GERD, chest pain or tightness, fever, chills, sweats. No postnasal drip.    OV 09/18/19: Derrick Carter is a 29 year old gentleman who presents for follow-up of asthma.  He is accompanied by his mother.  His breathing has been worse since his Covid shot in February.  He has a raspy sounding voice that is better when he is off work for a few days.  He denies nasal symptoms and postnasal drip.  He is no longer taking Claritin and Flonase.  He continues on Dulera 200 twice daily, but feels that it wears off after 2 hours, where it previously was lasting 6 hours at a time.  He has used his albuterol twice, without benefit.  He feels that his symptoms worsened after his Covid vaccine in February.  He describes it as feeling like he cannot catch his breath and wheezing.  He felt like he could not go to work today due to shortness of breath.  No heartburn or history of GERD.  No recent medication changes.   OV 04/10/2019: Derrick Carter is  a 29 year old gentleman who presents for follow-up of chronic asthma.  He is accompanied by his mother to this visit today.  Recently he has been doing well on Dulera, but his insurance does not cover it and he is paying over $300 out-of-pocket.  He has previously tried Arnuity, Symbicort, and Advair with severe recurrence of symptoms.  He denies dyspnea on exertion, activity limitation, coughing, wheezing.  He uses his albuterol inhaler every day twice daily at baseline-once in the morning and once before work, not due to symptoms.  He has never been hospitalized for his asthma or required systemic steroids for exacerbations.  He works for the [employer], and has been compliant with mask wearing.  He has been very cautious knowing that he is at risk for exposure to COVID-19.  He has not yet had his flu vaccination.  Currently his allergies are well controlled.  During the winter he is able to come off of his Claritin and Flonase.  He has not undergone allergy testing in the past.  He denies heartburn.   Past Medical History:  Diagnosis Date  . Acanthosis 08/24/2014  . ADHD (attention deficit hyperactivity disorder)   . Annual physical exam 04/28/2014  . Attention deficit hyperactivity disorder (ADHD) 02/04/2008   Overview:  ADHD, Combined Type  ICD-10 cut over   Last Assessment & Plan:  Relevant Hx: Course: Daily Update:  Today's Plan: restart Strattera  . Callus of foot 08/07/2017  . Chest pain 10/30/2017  . Chronic myofascial pain 03/23/2017  . Chronic pain disorder 03/23/2017  . Dental crown present   . Diabetes mellitus without complication ([place])   . Diabetes mellitus, type 2 ([place]) 04/29/2014  . Hyperlipidemia   . Hypertriglyceridemia 11/19/2012  . Insomnia 03/06/2014  . Low back pain 08/19/2015  . Migraine headache 05/19/2013  . Obesity (BMI 30-39.9) 03/28/2013  . Patellofemoral syndrome, bilateral 08/07/2017  . Pilonidal cyst 03/2013  . Pilonidal disease s/p I&D 03/10/2013 11/19/2012  .  Rhinitis 08/07/2017  . 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 Thoracic spondylosis without myelopathy 03/23/2017     Family History  Problem Relation Age of Onset  . Hyperlipidemia Mother   . Heart disease Mother   . Diabetes Mother   . Hypertension Mother   . Diabetes Maternal Grandmother   . Hyperlipidemia Maternal Grandmother   . Hyperlipidemia Maternal Grandfather      Past Surgical History:  Procedure Laterality Date  . LEFT HEART CATH AND CORONARY ANGIOGRAPHY N/A 12/24/2017   Procedure: LEFT HEART CATH AND CORONARY ANGIOGRAPHY;  Surgeon: Nelva Bush, MD;  Location: [facility];  Service: Cardiovascular;  Laterality: N/A;  . NO PAST SURGERIES    . PILONIDAL CYST EXCISION N/A 03/10/2013   Procedure: CYST EXCISION PILONIDAL SIMPLE I and D ;  Surgeon: Merrie Roof, MD;  Location: [facility];  Service: General;  Laterality: N/A;    Social History   Socioeconomic History  . Marital status: Single    Spouse name: Not on file  . Number of children: Not on file  . Years of education: Not on file  . Highest education level: Not on file  Occupational History  . Not on file  Tobacco Use  . Smoking status: Never Smoker  . Smokeless tobacco: Never Used  Substance and Sexual Activity  . Alcohol use: No  . Drug use: No  . Sexual activity: Never  Other Topics Concern  . Not on file  Social History Narrative  . Not on file   Social Determinants of Health   Financial Resource Strain:   . Difficulty of Paying Living Expenses:   Food Insecurity:   . Worried About Charity fundraiser in the Last Year:   . Arboriculturist in the Last Year:   Transportation Needs:   . Film/video editor (Medical):   Marland Kitchen Lack of Transportation (Non-Medical):   Physical Activity:   . Days of Exercise per Week:   . Minutes of Exercise per Session:   Stress:   . Feeling of Stress :   Social Connections:   . Frequency of Communication with Friends and Family:   .  Frequency of Social Gatherings with Friends and Family:   . Attends Religious Services:   . Active Member of Clubs or Organizations:   . Attends Archivist Meetings:   Marland Kitchen Marital Status:   Intimate Partner Violence:   . Fear of Current or Ex-Partner:   . Emotionally Abused:   Marland Kitchen Physically Abused:   . Sexually Abused:      No Known Allergies   Immunization History  Administered Date(s) Administered  . DTaP 07/13/1992, 09/26/1992, 11/12/1992, 11/25/1993, 07/15/1996  . HPV Quadrivalent 04/13/2010, 10/21/2010, 09/06/2011  . Hepatitis A, Ped/Adol-2 Dose 01/24/2006, 08/21/2006  . Hepatitis B, ped/adol 03-25-1992, 07/13/1992, 11/12/1992  . HiB (PRP-OMP) 07/13/1992, 09/21/1992, 11/12/1992, 08/15/1993  . IPV  07/13/1992, 09/21/1992, 11/25/1993, 07/15/1996  . Influenza Nasal 03/31/2011  . Influenza,Quad,Nasal, Live 04/16/2012  . Influenza,inj,Quad PF,6+ Mos 05/07/2013, 04/28/2014, 04/01/2015, 05/01/2016, 03/25/2017, 03/28/2018, 04/10/2019  . Influenza,inj,quad, With Preservative 05/20/2008, 04/22/2009, 04/13/2010  . Influenza-Unspecified 05/20/2008, 04/22/2009, 04/13/2010  . MMR 08/15/1993, 07/15/1996  . Meningococcal Conjugate 01/24/2006, 04/13/2010  . Moderna SARS-COVID-2 Vaccination 07/23/2019, 08/26/2019  . Pneumococcal Polysaccharide-23 05/05/2014, 04/10/2019  . Td 05/19/2004, 02/11/2008  . Tdap 02/11/2008, 11/16/2015  . Varicella 05/12/1994    Outpatient Medications Prior to Visit  Medication Sig Dispense Refill  . albuterol (PROVENTIL) (2.5 MG/3ML) 0.083% nebulizer solution Take 3 mLs (2.5 mg total) by nebulization every 6 (six) hours as needed for wheezing or shortness of breath. 360 mL 5  . albuterol (VENTOLIN HFA) 108 (90 Base) MCG/ACT inhaler Inhale 2 puffs into the lungs every 6 (six) hours as needed for wheezing or shortness of breath. 6.7 g 11  . atorvastatin (LIPITOR) 40 MG tablet TAKE 1 TABLET (40 MG TOTAL) BY MOUTH DAILY AT 6 PM. 90 tablet 1  .  Dapagliflozin-metFORMIN HCl ER (XIGDUO XR) 04-999 MG TB24 Take 1 tablet by mouth daily. 90 tablet 3  . fenofibrate 160 MG tablet Take 1 tablet (160 mg total) by mouth daily. 90 tablet 1  . finasteride (PROPECIA) 1 MG tablet Take 1 tablet (1 mg total) by mouth daily. 90 tablet 3  . fluticasone (FLONASE) 50 MCG/ACT nasal spray One spray in each nostril twice a day, use left hand for right nostril, and right hand for left nostril. 48 g 3  . loratadine (CLARITIN) 10 MG tablet Take 1 tablet (10 mg total) by mouth daily. 90 tablet 3  . lurasidone (LATUDA) 40 MG TABS tablet Take 1 tablet (40 mg total) by mouth daily with breakfast. 30 tablet 3  . minoxidil (MINOXIDIL FOR MEN) 2 % external solution Apply topically 2 (two) times daily. 60 mL 11  . mometasone-formoterol (DULERA) 200-5 MCG/ACT AERO Inhale 2 puffs into the lungs 2 (two) times daily. 13 g 11  . polyethylene glycol (MIRALAX / GLYCOLAX) 17 g packet Take 17 g by mouth 2 (two) times daily. Until stooling regularly 30 packet 11  . tiotropium (SPIRIVA) 18 MCG inhalation capsule Place 1 capsule (18 mcg total) into inhaler and inhale daily. 30 capsule 11   No facility-administered medications prior to visit.    Review of Systems  Constitutional: Negative for chills, fever and weight loss.  HENT: Negative for congestion and sore throat.   Eyes: Negative.   Respiratory: Negative for cough, hemoptysis, sputum production and wheezing.   Cardiovascular: Negative for chest pain and leg swelling.  Gastrointestinal: Negative for heartburn, nausea and vomiting.  Genitourinary: Negative.   Musculoskeletal: Negative.   Skin: Negative for rash.  Neurological: Negative.   Endo/Heme/Allergies: Positive for environmental allergies.     Objective:   Vitals:   10/20/19 1648  BP: 118/70  Pulse: 90  Temp: 97.8 F (36.6 C)  TempSrc: Temporal  SpO2: 98%  Weight: 200 lb 9.6 oz (91 kg)  Height: '5\' 11"'$  (1.803 m)   98% on  RA BMI Readings from Last 3  Encounters:  10/20/19 27.98 kg/m  09/18/19 28.03 kg/m  09/02/19 27.89 kg/m   Wt Readings from Last 3 Encounters:  10/20/19 200 lb 9.6 oz (91 kg)  09/18/19 201 lb (91.2 kg)  09/02/19 200 lb (90.7 kg)    Physical Exam Vitals reviewed.  Constitutional:      General: He is not in acute distress.    Appearance:  Normal appearance. He is not ill-appearing.  HENT:     Head: Normocephalic and atraumatic.  Eyes:     General: No scleral icterus. Cardiovascular:     Rate and Rhythm: Normal rate and regular rhythm.  Pulmonary:     Comments: Breathing comfortably on room air, no conversational dyspnea.  No coughing during encounter.  Clear to auscultation bilaterally. Abdominal:     General: There is no distension.     Palpations: Abdomen is soft.     Tenderness: There is no abdominal tenderness.  Musculoskeletal:        General: No swelling or deformity.  Lymphadenopathy:     Cervical: No cervical adenopathy.  Skin:    General: Skin is warm and dry.     Findings: No rash.  Neurological:     General: No focal deficit present.     Mental Status: He is alert.     Coordination: Coordination normal.  Psychiatric:        Mood and Affect: Mood normal.        Behavior: Behavior normal.      CBC    Component Value Date/Time   WBC 10.3 09/24/2019 1422   RBC 4.97 09/24/2019 1422   HGB 15.5 09/24/2019 1422   HGB 15.9 12/13/2017 1140   HCT 45.9 09/24/2019 1422   HCT 48.7 12/13/2017 1140   PLT 307.0 09/24/2019 1422   PLT 322 12/13/2017 1140   MCV 92.4 09/24/2019 1422   MCV 93 12/13/2017 1140   MCH 30.2 12/30/2018 1534   MCHC 33.6 09/24/2019 1422   RDW 12.3 09/24/2019 1422   RDW 12.8 12/13/2017 1140   LYMPHSABS 2.2 09/24/2019 1422   MONOABS 0.7 09/24/2019 1422   EOSABS 0.1 09/24/2019 1422   BASOSABS 0.0 09/24/2019 1422    CHEMISTRY CMP Latest Ref Rng & Units 12/30/2018 07/04/2018 01/02/2018  Glucose 65 - 99 mg/dL 128(H) 223(H) 100(H)  BUN 7 - 25 mg/dL _0 Creatinine  0.60 - 1.35 mg/dL 0.89 0.96 0.97  Sodium 135 - 146 mmol/L 142 138 139  Potassium 3.5 - 5.3 mmol/L 4.1 4.1 4.0  Chloride 98 - 110 mmol/L 107 101 99  CO2 20 - 32 mmol/L _1 Calcium 8.6 - 10.3 mg/dL 10.3 10.3 9.8  Total Protein 6.1 - 8.1 g/dL 7.3 7.9 -  Total Bilirubin 0.2 - 1.2 mg/dL 0.6 0.8 -  Alkaline Phos 40 - 115 U/L - - -  AST 10 - 40 U/L 23 30 -  ALT 9 - 46 U/L 32 48(H) -     Chest Imaging- films reviewed: CT chest high-resolution 11/27/2017- no ILD or other abnormalities  Pulmonary Functions Testing Results: PFT Results Latest Ref Rng & Units 02/05/2018  FVC-Pre L 4.56  FVC-Predicted Pre % 90  FVC-Post L 4.48  FVC-Predicted Post % 88  Pre FEV1/FVC % % 81  Post FEV1/FCV % % 85  FEV1-Pre L 3.68  FEV1-Predicted Pre % 87  FEV1-Post L 3.81  DLCO UNC% % 112  DLCO COR %Predicted % 120  TLC L 6.84  TLC % Predicted % 108  RV % Predicted % 126  No significant obstruction or bronchodilator reversibility. Normal lung capacity and diffusion capacity.  Small airways disease.  May 2019 spirometry test from primary care: Ratio normal, forced vital capacity 4.1 L (73% predicted)  Echocardiogram: July 2019 transthoracic echocardiogram showed an LVEF of 60 to 65% and otherwise normal parameters  Heart Catheterization June 2019- left heart catheterization showed no  evidence of coronary artery disease, normal LVEF with mildly elevated left ventricular end-diastolic pressure    Assessment & Plan:     ICD-10-CM   1. Severe persistent asthma without complication  B74.93   2. Allergic rhinitis, unspecified seasonality, unspecified trigger  J30.9     Severe persistent asthma by symptoms. His physical exam is less consistent and remains perplexing. -Continue Dulera twice daily. -Okay to stop Spiriva as it has not improved his symptoms -Start Singulair once daily -Recommend using albuterol every 4 hours as needed for uncontrolled symptoms. -Continue allergic rhinosinusitis  management -Stressed the importance of repeat PFTs to help understand his symptoms and determine the best course for treatment. -Up-to-date on flu, Covid, and pneumococcal 23 vaccinations  Allergic rhinosinusitis -Continue Flonase and Claritin   RTC in 1 month after PFT.   Current Outpatient Medications:  .  albuterol (PROVENTIL) (2.5 MG/3ML) 0.083% nebulizer solution, Take 3 mLs (2.5 mg total) by nebulization every 6 (six) hours as needed for wheezing or shortness of breath., Disp: 360 mL, Rfl: 5 .  albuterol (VENTOLIN HFA) 108 (90 Base) MCG/ACT inhaler, Inhale 2 puffs into the lungs every 6 (six) hours as needed for wheezing or shortness of breath., Disp: 6.7 g, Rfl: 11 .  atorvastatin (LIPITOR) 40 MG tablet, TAKE 1 TABLET (40 MG TOTAL) BY MOUTH DAILY AT 6 PM., Disp: 90 tablet, Rfl: 1 .  Dapagliflozin-metFORMIN HCl ER (XIGDUO XR) 04-999 MG TB24, Take 1 tablet by mouth daily., Disp: 90 tablet, Rfl: 3 .  fenofibrate 160 MG tablet, Take 1 tablet (160 mg total) by mouth daily., Disp: 90 tablet, Rfl: 1 .  finasteride (PROPECIA) 1 MG tablet, Take 1 tablet (1 mg total) by mouth daily., Disp: 90 tablet, Rfl: 3 .  fluticasone (FLONASE) 50 MCG/ACT nasal spray, One spray in each nostril twice a day, use left hand for right nostril, and right hand for left nostril., Disp: 48 g, Rfl: 3 .  loratadine (CLARITIN) 10 MG tablet, Take 1 tablet (10 mg total) by mouth daily., Disp: 90 tablet, Rfl: 3 .  lurasidone (LATUDA) 40 MG TABS tablet, Take 1 tablet (40 mg total) by mouth daily with breakfast., Disp: 30 tablet, Rfl: 3 .  minoxidil (MINOXIDIL FOR MEN) 2 % external solution, Apply topically 2 (two) times daily., Disp: 60 mL, Rfl: 11 .  mometasone-formoterol (DULERA) 200-5 MCG/ACT AERO, Inhale 2 puffs into the lungs 2 (two) times daily., Disp: 13 g, Rfl: 11 .  polyethylene glycol (MIRALAX / GLYCOLAX) 17 g packet, Take 17 g by mouth 2 (two) times daily. Until stooling regularly, Disp: 30 packet, Rfl: 11 .   tiotropium (SPIRIVA) 18 MCG inhalation capsule, Place 1 capsule (18 mcg total) into inhaler and inhale daily., Disp: 30 capsule, Rfl: 11 .  montelukast (SINGULAIR) 10 MG tablet, Take 1 tablet (10 mg total) by mouth at bedtime., Disp: 30 tablet, Rfl: 11   Julian Hy, DO [facility] 10/20/2019 5:19 PM

## 2019-10-27 ENCOUNTER — Ambulatory Visit (INDEPENDENT_AMBULATORY_CARE_PROVIDER_SITE_OTHER): Payer: BC Managed Care – PPO | Admitting: Sports Medicine

## 2019-10-27 ENCOUNTER — Other Ambulatory Visit: Payer: Self-pay

## 2019-10-27 ENCOUNTER — Encounter: Payer: Self-pay | Admitting: Sports Medicine

## 2019-10-27 VITALS — BP 124/78 | HR 80 | Ht 71.0 in | Wt 198.0 lb

## 2019-10-27 DIAGNOSIS — Z0000 Encounter for general adult medical examination without abnormal findings: Secondary | ICD-10-CM

## 2019-10-27 DIAGNOSIS — E119 Type 2 diabetes mellitus without complications: Secondary | ICD-10-CM

## 2019-10-27 DIAGNOSIS — F319 Bipolar disorder, unspecified: Secondary | ICD-10-CM | POA: Diagnosis not present

## 2019-10-27 DIAGNOSIS — E559 Vitamin D deficiency, unspecified: Secondary | ICD-10-CM

## 2019-10-27 MED ORDER — LURASIDONE HCL 80 MG PO TABS: 80.0000 mg | ORAL_TABLET | Freq: Every day | ORAL | 3 refills | Status: DC

## 2019-10-27 NOTE — Assessment & Plan Note (Signed)
Derrick Carter returns, he has what we suspect is bipolar 2 disorder, he did really well with Seroquel, this improved his insomnia, aggression, flight of ideas, we had discontinued citalopram but unfortunately he had recurrence of some mild depressive symptoms without suicidal or homicidal ideation, at the last visit we overhauled his entire regimen, we discontinued Seroquel and switch to Jordan. He feels as though Kasandra Knudsen is the best medication he has taken thus far, it has improved his anxiety symptoms, his manic symptoms, he still has some mild depression though better. He is only on 40 mg of Latuda, max is 160 mg, we are going to go ahead and jump up to 80 mg of Latuda with a 6-week follow-up for dose adjustment if needed.

## 2019-10-27 NOTE — Assessment & Plan Note (Signed)
Annual physical as above. Ordering routine labs.  

## 2019-10-27 NOTE — Assessment & Plan Note (Signed)
Rechecking hemoglobin A1c, urine microalbumin.

## 2019-10-27 NOTE — Progress Notes (Signed)
Subjective:    CC: Annual Physical Exam  HPI:  This patient is here for their annual physical  I reviewed the past medical history, family history, social history, surgical history, and allergies today and no changes were needed.  Please see the problem list section below in epic for further details.  Past Medical History: Past Medical History:  Diagnosis Date  . Acanthosis 08/24/2014  . ADHD (attention deficit hyperactivity disorder)   . Annual physical exam 04/28/2014  . Attention deficit hyperactivity disorder (ADHD) 02/04/2008   Overview:  ADHD, Combined Type  ICD-10 cut over   Last Assessment & Plan:  Relevant Hx: Course: Daily Update: Today's Plan: restart Strattera  . Callus of foot 08/07/2017  . Chest pain 10/30/2017  . Chronic myofascial pain 03/23/2017  . Chronic pain disorder 03/23/2017  . Dental crown present   . Diabetes mellitus without complication ([place])   . Diabetes mellitus, type 2 ([place]) 04/29/2014  . Hyperlipidemia   . Hypertriglyceridemia 11/19/2012  . Insomnia 03/06/2014  . Low back pain 08/19/2015  . Migraine headache 05/19/2013  . Obesity (BMI 30-39.9) 03/28/2013  . Patellofemoral syndrome, bilateral 08/07/2017  . Pilonidal cyst 03/2013  . Pilonidal disease s/p I&D 03/10/2013 11/19/2012  . Rhinitis 08/07/2017  . 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 Thoracic spondylosis without myelopathy 03/23/2017   Past Surgical History: Past Surgical History:  Procedure Laterality Date  . LEFT HEART CATH AND CORONARY ANGIOGRAPHY N/A 12/24/2017   Procedure: LEFT HEART CATH AND CORONARY ANGIOGRAPHY;  Surgeon: Nelva Bush, MD;  Location: [facility];  Service: Cardiovascular;  Laterality: N/A;  . NO PAST SURGERIES    . PILONIDAL CYST EXCISION N/A 03/10/2013   Procedure: CYST EXCISION PILONIDAL SIMPLE I and D ;  Surgeon: Merrie Roof, MD;  Location: [facility];  Service: General;  Laterality: N/A;   Social History: Social History   Socioeconomic History    . Marital status: Single    Spouse name: Not on file  . Number of children: Not on file  . Years of education: Not on file  . Highest education level: Not on file  Occupational History  . Not on file  Tobacco Use  . Smoking status: Never Smoker  . Smokeless tobacco: Never Used  Substance and Sexual Activity  . Alcohol use: No  . Drug use: No  . Sexual activity: Never  Other Topics Concern  . Not on file  Social History Narrative  . Not on file   Social Determinants of Health   Financial Resource Strain:   . Difficulty of Paying Living Expenses:   Food Insecurity:   . Worried About Charity fundraiser in the Last Year:   . Arboriculturist in the Last Year:   Transportation Needs:   . Film/video editor (Medical):   Marland Kitchen Lack of Transportation (Non-Medical):   Physical Activity:   . Days of Exercise per Week:   . Minutes of Exercise per Session:   Stress:   . Feeling of Stress :   Social Connections:   . Frequency of Communication with Friends and Family:   . Frequency of Social Gatherings with Friends and Family:   . Attends Religious Services:   . Active Member of Clubs or Organizations:   . Attends Archivist Meetings:   Marland Kitchen Marital Status:    Family History: Family History  Problem Relation Age of Onset  . Hyperlipidemia Mother   . Heart disease Mother   .  Diabetes Mother   . Hypertension Mother   . Diabetes Maternal Grandmother   . Hyperlipidemia Maternal Grandmother   . Hyperlipidemia Maternal Grandfather    Allergies: No Known Allergies Medications: See med rec.  Review of Systems: No headache, visual changes, nausea, vomiting, diarrhea, constipation, dizziness, abdominal pain, skin rash, fevers, chills, night sweats, swollen lymph nodes, weight loss, chest pain, body aches, joint swelling, muscle aches, shortness of breath, mood changes, visual or auditory hallucinations.  Objective:    General: Well Developed, well nourished, and in no  acute distress.  Neuro: Alert and oriented x3, extra-ocular muscles intact, sensation grossly intact. Cranial nerves II through XII are intact, motor, sensory, and coordinative functions are all intact. HEENT: Normocephalic, atraumatic, pupils equal round reactive to light, neck supple, no masses, no lymphadenopathy, thyroid nonpalpable. Oropharynx, nasopharynx, external ear canals are unremarkable. Skin: Warm and dry, no rashes noted.  Cardiac: Regular rate and rhythm, no murmurs rubs or gallops.  Respiratory: Clear to auscultation bilaterally. Not using accessory muscles, speaking in full sentences.  Abdominal: Soft, nontender, nondistended, positive bowel sounds, no masses, no organomegaly.  Musculoskeletal: Shoulder, elbow, wrist, hip, knee, ankle stable, and with full range of motion.  Impression and Recommendations:    The patient was counselled, risk factors were discussed, anticipatory guidance given.  Annual physical exam Annual physical as above. Ordering routine labs.  Diabetes mellitus, type 2 Rechecking hemoglobin A1c, urine microalbumin.  Bipolar disorder (HCC) Sourish returns, he has what we suspect is bipolar 2 disorder, he did really well with Seroquel, this improved his insomnia, aggression, flight of ideas, we had discontinued citalopram but unfortunately he had recurrence of some mild depressive symptoms without suicidal or homicidal ideation, at the last visit we overhauled his entire regimen, we discontinued Seroquel and switch to Jordan. He feels as though Kasandra Knudsen is the best medication he has taken thus far, it has improved his anxiety symptoms, his manic symptoms, he still has some mild depression though better. He is only on 40 mg of Latuda, max is 160 mg, we are going to go ahead and jump up to 80 mg of Latuda with a 6-week follow-up for dose adjustment if needed.   ___________________________________________ Ihor Austin. Benjamin Stain, M.D., ABFM., CAQSM. Primary  Care and Sports Medicine [facility]  Adjunct Professor of Family Medicine  [employer]

## 2019-10-28 LAB — CBC
HCT: 48.5 % (ref 38.5–50.0)
Hemoglobin: 16.2 g/dL (ref 13.2–17.1)
MCH: 30.6 pg (ref 27.0–33.0)
MCHC: 33.4 g/dL (ref 32.0–36.0)
MCV: 91.7 fL (ref 80.0–100.0)
MPV: 10.4 fL (ref 7.5–12.5)
Platelets: 390 10*3/uL (ref 140–400)
RBC: 5.29 10*6/uL (ref 4.20–5.80)
RDW: 12.2 % (ref 11.0–15.0)
WBC: 10.2 10*3/uL (ref 3.8–10.8)

## 2019-10-28 LAB — COMPLETE METABOLIC PANEL WITH GFR
AG Ratio: 1.9 (calc) (ref 1.0–2.5)
ALT: 32 U/L (ref 9–46)
AST: 22 U/L (ref 10–40)
Albumin: 4.7 g/dL (ref 3.6–5.1)
Alkaline phosphatase (APISO): 41 U/L (ref 36–130)
BUN: 16 mg/dL (ref 7–25)
CO2: 24 mmol/L (ref 20–32)
Calcium: 9.5 mg/dL (ref 8.6–10.3)
Chloride: 104 mmol/L (ref 98–110)
Creat: 0.82 mg/dL (ref 0.60–1.35)
GFR, Est African American: 140 mL/min/{1.73_m2} (ref >=60)
GFR, Est Non African American: 121 mL/min/{1.73_m2} (ref >=60)
Globulin: 2.5 g/dL (calc) (ref 1.9–3.7)
Glucose, Bld: 177 mg/dL — ABNORMAL HIGH (ref 65–99)
Potassium: 3.9 mmol/L (ref 3.5–5.3)
Sodium: 140 mmol/L (ref 135–146)
Total Bilirubin: 0.6 mg/dL (ref 0.2–1.2)
Total Protein: 7.2 g/dL (ref 6.1–8.1)

## 2019-10-28 LAB — MICROALBUMIN / CREATININE URINE RATIO
Creatinine, Urine: 83 mg/dL (ref 20–320)
Microalb Creat Ratio: 2 mcg/mg creat (ref <30)
Microalb, Ur: 0.2 mg/dL

## 2019-10-28 LAB — LIPID PANEL W/REFLEX DIRECT LDL
Cholesterol: 197 mg/dL (ref <200)
HDL: 29 mg/dL — ABNORMAL LOW (ref >=40)
LDL Cholesterol (Calc): 142 mg/dL (calc) — ABNORMAL HIGH
Non-HDL Cholesterol (Calc): 168 mg/dL (calc) — ABNORMAL HIGH (ref <130)
Total CHOL/HDL Ratio: 6.8 (calc) — ABNORMAL HIGH (ref <5.0)
Triglycerides: 132 mg/dL (ref <150)

## 2019-10-28 LAB — HEMOGLOBIN A1C
Hgb A1c MFr Bld: 12.7 % of total Hgb — ABNORMAL HIGH (ref <5.7)
Mean Plasma Glucose: 318 (calc)
eAG (mmol/L): 17.6 (calc)

## 2019-10-28 LAB — HIV ANTIBODY (ROUTINE TESTING W REFLEX): HIV 1&2 Ab, 4th Generation: NONREACTIVE

## 2019-10-28 LAB — TSH: TSH: 0.95 mIU/L (ref 0.40–4.50)

## 2019-10-28 LAB — VITAMIN D 25 HYDROXY (VIT D DEFICIENCY, FRACTURES): Vit D, 25-Hydroxy: 19 ng/mL — ABNORMAL LOW (ref 30–100)

## 2019-10-28 MED ORDER — VITAMIN D (ERGOCALCIFEROL) 1.25 MG (50000 UNIT) PO CAPS: 50000.0000 [IU] | ORAL_CAPSULE | ORAL | 0 refills | Status: DC

## 2019-10-28 NOTE — Addendum Note (Signed)
Addended by: Monica Becton on: 10/28/2019 08:25 AM   Modules accepted: Orders

## 2019-10-30 ENCOUNTER — Telehealth: Payer: Self-pay

## 2019-10-30 NOTE — Telephone Encounter (Signed)
MyChart msg sent requesting response concerning labs

## 2019-11-05 ENCOUNTER — Ambulatory Visit: Payer: BC Managed Care – PPO | Admitting: Sports Medicine

## 2019-11-05 ENCOUNTER — Other Ambulatory Visit: Payer: Self-pay

## 2019-11-05 ENCOUNTER — Encounter: Payer: Self-pay | Admitting: Sports Medicine

## 2019-11-05 ENCOUNTER — Other Ambulatory Visit: Payer: Self-pay | Admitting: Sports Medicine

## 2019-11-05 DIAGNOSIS — E782 Mixed hyperlipidemia: Secondary | ICD-10-CM

## 2019-11-05 DIAGNOSIS — E119 Type 2 diabetes mellitus without complications: Secondary | ICD-10-CM | POA: Diagnosis not present

## 2019-11-05 DIAGNOSIS — E781 Pure hyperglyceridemia: Secondary | ICD-10-CM | POA: Diagnosis not present

## 2019-11-05 MED ORDER — SAXAGLIPTIN HCL 5 MG PO TABS: 5.0000 mg | ORAL_TABLET | Freq: Every day | ORAL | 3 refills | Status: DC

## 2019-11-05 MED ORDER — GLIPIZIDE 10 MG PO TABS: 10.0000 mg | ORAL_TABLET | Freq: Two times a day (BID) | ORAL | 3 refills | Status: DC

## 2019-11-05 MED ORDER — ATORVASTATIN CALCIUM 80 MG PO TABS: 80.0000 mg | ORAL_TABLET | Freq: Every day | ORAL | 3 refills | Status: DC

## 2019-11-05 NOTE — Assessment & Plan Note (Addendum)
Unfortunately diabetes is uncontrolled in spite of high-dose XigDuo. He does endorse compliance with medications so for this reason we are going to add glipizide 10 mg twice daily. I am also adding Onglyza high-dose. Recheck A1c in 3 months.  Update:  Note from pharmacy, Onglyza not covered, switching to Januvia.

## 2019-11-05 NOTE — Assessment & Plan Note (Signed)
LDL still elevated, he does tell me he is taking his atorvastatin and fenofibrate, increasing atorvastatin to 80 mg daily, continue fenofibrate, recheck fasting lipids in 3 months. If insufficient improvement we will switch him to Repatha.

## 2019-11-05 NOTE — Progress Notes (Addendum)
    Procedures performed today:    None.  Independent interpretation of notes and tests performed by another provider:   None.  Brief History, Exam, Impression, and Recommendations:    Diabetes mellitus, type 2 Unfortunately diabetes is uncontrolled in spite of high-dose XigDuo. He does endorse compliance with medications so for this reason we are going to add glipizide 10 mg twice daily. I am also adding Onglyza high-dose. Recheck A1c in 3 months.  Update:  Note from pharmacy, Onglyza not covered, switching to Januvia.  Hyperlipidemia LDL still elevated, he does tell me he is taking his atorvastatin and fenofibrate, increasing atorvastatin to 80 mg daily, continue fenofibrate, recheck fasting lipids in 3 months. If insufficient improvement we will switch him to Repatha.    ___________________________________________ Ihor Austin. Benjamin Stain, M.D., ABFM., CAQSM. Primary Care and Sports Medicine [facility]  Adjunct Instructor of Family Medicine  [employer]

## 2019-11-11 ENCOUNTER — Other Ambulatory Visit (HOSPITAL_COMMUNITY)
Admission: RE | Admit: 2019-11-11 | Discharge: 2019-11-11 | Disposition: A | Discharge status: Home / Self Care | Payer: BC Managed Care – PPO | Source: Ambulatory Visit | Attending: Critical Care Medicine | Admitting: Critical Care Medicine

## 2019-11-11 ENCOUNTER — Other Ambulatory Visit: Payer: Self-pay | Admitting: Sports Medicine

## 2019-11-11 DIAGNOSIS — Z20822 Contact with and (suspected) exposure to covid-19: Secondary | ICD-10-CM | POA: Insufficient documentation

## 2019-11-11 DIAGNOSIS — Z01812 Encounter for preprocedural laboratory examination: Secondary | ICD-10-CM | POA: Diagnosis present

## 2019-11-11 LAB — SARS CORONAVIRUS 2 (TAT 6-24 HRS): SARS Coronavirus 2: NEGATIVE

## 2019-11-14 ENCOUNTER — Other Ambulatory Visit: Payer: Self-pay

## 2019-11-14 ENCOUNTER — Ambulatory Visit (INDEPENDENT_AMBULATORY_CARE_PROVIDER_SITE_OTHER): Payer: BC Managed Care – PPO | Admitting: Critical Care Medicine

## 2019-11-14 ENCOUNTER — Ambulatory Visit: Payer: BC Managed Care – PPO | Admitting: Pulmonary Disease

## 2019-11-14 ENCOUNTER — Ambulatory Visit (INDEPENDENT_AMBULATORY_CARE_PROVIDER_SITE_OTHER): Payer: BC Managed Care – PPO

## 2019-11-14 ENCOUNTER — Encounter: Payer: Self-pay | Admitting: Pulmonary Disease

## 2019-11-14 VITALS — BP 110/64 | HR 94 | Temp 98.3°F | Ht 71.0 in | Wt 204.0 lb

## 2019-11-14 DIAGNOSIS — J4540 Moderate persistent asthma, uncomplicated: Secondary | ICD-10-CM | POA: Diagnosis not present

## 2019-11-14 DIAGNOSIS — J4530 Mild persistent asthma, uncomplicated: Secondary | ICD-10-CM

## 2019-11-14 DIAGNOSIS — Z79899 Other long term (current) drug therapy: Secondary | ICD-10-CM | POA: Diagnosis not present

## 2019-11-14 DIAGNOSIS — J310 Chronic rhinitis: Secondary | ICD-10-CM

## 2019-11-14 LAB — PULMONARY FUNCTION TEST
DL/VA % pred: 111 %
DL/VA: 5.58 ml/min/mmHg/L
DLCO unc % pred: 79 %
DLCO unc: 27.09 ml/min/mmHg
FEF 25-75 Post: 5.87 L/sec
FEF 25-75 Pre: 3.89 L/sec
FEF2575-%Change-Post: 50 %
FEF2575-%Pred-Post: 124 %
FEF2575-%Pred-Pre: 82 %
FEV1-%Change-Post: 17 %
FEV1-%Pred-Post: 78 %
FEV1-%Pred-Pre: 66 %
FEV1-Post: 3.67 L
FEV1-Pre: 3.11 L
FEV1FVC-%Change-Post: -2 %
FEV1FVC-%Pred-Pre: 106 %
FEV6-%Change-Post: 20 %
FEV6-%Pred-Post: 75 %
FEV6-%Pred-Pre: 62 %
FEV6-Post: 4.26 L
FEV6-Pre: 3.54 L
FEV6FVC-%Pred-Post: 100 %
FEV6FVC-%Pred-Pre: 100 %
FVC-%Change-Post: 20 %
FVC-%Pred-Post: 74 %
FVC-%Pred-Pre: 62 %
FVC-Post: 4.26 L
FVC-Pre: 3.54 L
Post FEV1/FVC ratio: 86 %
Post FEV6/FVC ratio: 100 %
Pre FEV1/FVC ratio: 88 %
Pre FEV6/FVC Ratio: 100 %
RV % pred: 48 %
RV: 0.79 L
TLC % pred: 66 %
TLC: 4.72 L

## 2019-11-14 NOTE — Assessment & Plan Note (Signed)
Suspect this is Th2 low asthma Patient reports worsening dyspnea on exertion, cough, wheezing No coughing during exam No wheezing on clinical exam Walk today in office patient tolerated without any oxygen desaturations or symptoms of dyspnea Pulmonary function testing today does not meet ATS standards There is a positive bronchodilator response IgE lab work earlier this year normal Eosinophils normal  Plan: Remain on Dulera 200 as patient finds clinical benefit and he has bronchodilator response on pulmonary function testing Explained to patient and his mother they are not many more options given his intolerance of any other inhaler Patient unable to tolerate Spiriva Remain on Singulair We will discuss with pharmacy team to see if they know of any other options to get patient's [facility] covered

## 2019-11-14 NOTE — Assessment & Plan Note (Signed)
Patient reports intolerance to all inhalers except for [facility] Patient's insurance does not cover [facility] Patient reports he is does not qualify for a coupon card from [facility] Good Rx does not show great financial savings  Plan: We will route note to pharmacy team to see if they have additional suggestions regarding Dulera coverage or if there is any other programs patient could utilize to obtain assistance with drug coverage

## 2019-11-14 NOTE — Progress Notes (Signed)
PFT completed today.  

## 2019-11-14 NOTE — Patient Instructions (Addendum)
You were seen today by Derrick Ceo, NP  for:   1. Mild persistent asthma without complication  - DG Chest 2 View; Future  Walk in office   Dulera 200 >>> 2 puffs in the morning right when you wake up, rinse out your mouth after use, 12 hours later 2 puffs, rinse after use >>> Take this daily, no matter what >>> This is not a rescue inhaler   Continue Singulair  Only use your albuterol as a rescue medication to be used if you can't catch your breath by resting or doing a relaxed purse lip breathing pattern.  - The less you use it, the better it will work when you need it. - Ok to use up to 2 puffs  every 4 hours if you must but call for immediate appointment if use goes up over your usual need - Don't leave home without it !!  (think of it like the spare tire for your car)   2. Chronic rhinitis  Continue Claritin  Continue Flonase  3. Medication management  I will ask our pharmacy team to see if they know of any other ways that you could potentially obtain [facility]   We recommend today:  Orders Placed This Encounter  Procedures  . DG Chest 2 View    Standing Status:   Future    Standing Expiration Date:   01/13/2021    Order Specific Question:   Reason for Exam (SYMPTOM  OR DIAGNOSIS REQUIRED)    Answer:   doe, asthma, cough    Order Specific Question:   Preferred imaging location?    Answer:   Internal    Order Specific Question:   Radiology Contrast Protocol - do NOT remove file path    Answer:   [email or web address]   Orders Placed This Encounter  Procedures  . DG Chest 2 View   No orders of the defined types were placed in this encounter.   Follow Up:    Return in about 2 months (around 01/14/2020), or if symptoms worsen or fail to improve, for Follow up with Dr. Chestine Spore.   Please do your part to reduce the spread of COVID-19:      Reduce your risk of any infection  and COVID19 by using the similar precautions used for  avoiding the common cold or flu:  Marland Kitchen Wash your hands often with soap and warm water for at least 20 seconds.  If soap and water are not readily available, use an alcohol-based hand sanitizer with at least 60% alcohol.  . If coughing or sneezing, cover your mouth and nose by coughing or sneezing into the elbow areas of your shirt or coat, into a tissue or into your sleeve (not your hands). Drinda Butts A MASK when in public  . Avoid shaking hands with others and consider head nods or verbal greetings only. . Avoid touching your eyes, nose, or mouth with unwashed hands.  . Avoid close contact with people who are sick. . Avoid places or events with large numbers of people in one location, like concerts or sporting events. . If you have some symptoms but not all symptoms, continue to monitor at home and seek medical attention if your symptoms worsen. . If you are having a medical emergency, call 911.   ADDITIONAL HEALTHCARE OPTIONS FOR PATIENTS  [employer] [facility] / e-Visit: [email or web address]         [facility]: [phone]  [facility]: [phone]  [facility]: [phone]     It is flu season:   >>> Best ways to protect herself from the flu: Receive the yearly flu vaccine, practice good hand hygiene washing with soap and also using hand sanitizer when available, eat a nutritious meals, get adequate rest, hydrate appropriately   Please contact the office if your symptoms worsen or you have concerns that you are not improving.   Thank you for choosing [facility] for your healthcare, and for allowing [place] to partner with you on your healthcare journey. I am thankful to be able to provide care to you today.   Wyn Quaker FNP-C

## 2019-11-14 NOTE — Progress Notes (Signed)
_0  ID: Derrick Carter, male    DOB: 19-Feb-1991, 29 y.o.   MRN: [id number]  Chief Complaint  Patient presents with  . Follow-up    F/U after PFT. States he has been wheezing on and off since February 2021. Increased cough.     Referring provider: Silverio Decamp  HPI:  29 year old never smoker followed in our office for asthma and chronic rhinitis  PMH: ADHD, type 2 diabetes, bipolar disorder, diastolic dysfunction smoker/ Smoking History: Never smoker Maintenance: Dulera 200 Pt of: Dr. Carlis Abbott  11/14/2019  - Visit   29 year old male never smoker followed in our office for asthma and chronic rhinitis.  Patient reporting to our office today after completing pulmonary function test.  Patient reporting that his shortness of breath and his dyspnea is the same if not worsened.  He has had some wheezing specifically at night when laying flat.  He has started the Singulair which he reports did help for the first 4 days when taking it but now does not feel he gets it helping anymore he still remains on it despite these feelings.  He continues to be maintained on [place] which his insurance does not cover.  This was costing him around 319 dollars a month out of pocket to purchase.  Patient reports that no other inhalers help him.  Despite the significance of his shortness of breath wheezing and cough patient reports he does not use his rescue inhaler.  He reports he has not used it since the winter.  Has not used it at all since last being seen by Dr. Carlis Abbott.  Patient reports that at our offices request he is try to obtain a coupon card from [facility] website.  He reports that he did not qualify.  [name] who is with him today reports that she is tried to obtain discount drug cost from good Rx.  Both of these attempts have been unsuccessful.  Patient's walk in office today was stable without any oxygen desaturations or acute symptoms.  Pulmonary function test was difficult to  complete.  It did not meet ATS standards.  Patient struggled to follow instructions.   Questionaires / Pulmonary Flowsheets:   ACT:  No flowsheet data found.  MMRC: No flowsheet data found.  Epworth:  No flowsheet data found.  Tests:   09/24/2019-IgE-31 09/24/2019-CBC with differential-eosinophils relative 0.6, eosinophils absolute 0.1  11/27/2017-CT chest high-res-no findings that suggest interstitial lung disease, no acute findings in the thorax to account for patient's symptoms, old healed fracture of the inferior aspect of sternum  FENO:  No results found for: NITRICOXIDE  PFT: PFT Results Latest Ref Rng & Units 11/14/2019 02/05/2018  FVC-Pre L 3.54 4.56  FVC-Predicted Pre % 62 90  FVC-Post L 4.26 4.48  FVC-Predicted Post % 74 88  Pre FEV1/FVC % % 88 81  Post FEV1/FCV % % 86 85  FEV1-Pre L 3.11 3.68  FEV1-Predicted Pre % 66 87  FEV1-Post L 3.67 3.81  DLCO UNC% % 79 112  DLCO COR %Predicted % 111 120  TLC L 4.72 6.84  TLC % Predicted % 66 108  RV % Predicted % 48 126    WALK:  SIX MIN WALK 11/14/2019 02/05/2018  Supplimental Oxygen during Test? (L/min) No No  Tech Comments: Patient was able to complete 2 laps at a steady pace. Did feel winded during the 2nd lap but wanted to continue walk. No O2 needed during or after walk. patient completed all 3 laps with no complaints  Imaging: No results found.  Lab Results:  CBC    Component Value Date/Time   WBC 10.2 10/27/2019 1519   RBC 5.29 10/27/2019 1519   HGB 16.2 10/27/2019 1519   HGB 15.9 12/13/2017 1140   HCT 48.5 10/27/2019 1519   HCT 48.7 12/13/2017 1140   PLT 390 10/27/2019 1519   PLT 322 12/13/2017 1140   MCV 91.7 10/27/2019 1519   MCV 93 12/13/2017 1140   MCH 30.6 10/27/2019 1519   MCHC 33.4 10/27/2019 1519   RDW 12.2 10/27/2019 1519   RDW 12.8 12/13/2017 1140   LYMPHSABS 2.2 09/24/2019 1422   MONOABS 0.7 09/24/2019 1422   EOSABS 0.1 09/24/2019 1422   BASOSABS 0.0 09/24/2019 1422    BMET     Component Value Date/Time   NA 140 10/27/2019 1519   NA 139 01/02/2018 1113   K 3.9 10/27/2019 1519   CL 104 10/27/2019 1519   CO2 24 10/27/2019 1519   GLUCOSE 177 (H) 10/27/2019 1519   BUN 16 10/27/2019 1519   BUN 19 01/02/2018 1113   CREATININE 0.82 10/27/2019 1519   CALCIUM 9.5 10/27/2019 1519   GFRNONAA 121 10/27/2019 1519   GFRAA 140 10/27/2019 1519    BNP No results found for: BNP  ProBNP    Component Value Date/Time   PROBNP 25 01/02/2018 1113    Specialty Problems      Pulmonary Problems   Rhinitis   SOB (shortness of breath) on exertion    Normal cardiac catheterization, echocardiogram. Normal lung function tests with only slight improvement post albuterol. Normal high-resolution chest CT.      Asthma      No Known Allergies  Immunization History  Administered Date(s) Administered  . DTaP 07/13/1992, 09/26/1992, 11/12/1992, 11/25/1993, 07/15/1996  . HPV Quadrivalent 04/13/2010, 10/21/2010, 09/06/2011  . Hepatitis A, Ped/Adol-2 Dose 01/24/2006, 08/21/2006  . Hepatitis B, ped/adol April [birth year] 1992, 07/13/1992, 11/12/1992  . HiB (PRP-OMP) 07/13/1992, 09/21/1992, 11/12/1992, 08/15/1993  . IPV 07/13/1992, 09/21/1992, 11/25/1993, 07/15/1996  . Influenza Nasal 03/31/2011  . Influenza,Quad,Nasal, Live 04/16/2012  . Influenza,inj,Quad PF,6+ Mos 05/07/2013, 04/28/2014, 04/01/2015, 05/01/2016, 03/25/2017, 03/28/2018, 04/10/2019  . Influenza,inj,quad, With Preservative 05/20/2008, 04/22/2009, 04/13/2010  . Influenza-Unspecified 05/20/2008, 04/22/2009, 04/13/2010  . MMR 08/15/1993, 07/15/1996  . Meningococcal Conjugate 01/24/2006, 04/13/2010  . Moderna SARS-COVID-2 Vaccination 07/23/2019, 08/26/2019  . Pneumococcal Polysaccharide-23 05/05/2014, 04/10/2019  . Td 05/19/2004, 02/11/2008  . Tdap 02/11/2008, 11/16/2015  . Varicella 05/12/1994    Past Medical History:  Diagnosis Date  . Acanthosis 08/24/2014  . ADHD (attention deficit hyperactivity disorder)   .  Annual physical exam 04/28/2014  . Attention deficit hyperactivity disorder (ADHD) 02/04/2008   Overview:  ADHD, Combined Type  ICD-10 cut over   Last Assessment & Plan:  Relevant Hx: Course: Daily Update: Today's Plan: restart Strattera  . Callus of foot 08/07/2017  . Chest pain 10/30/2017  . Chronic myofascial pain 03/23/2017  . Chronic pain disorder 03/23/2017  . Dental crown present   . Diabetes mellitus without complication ([place])   . Diabetes mellitus, type 2 ([place]) 04/29/2014  . Hyperlipidemia   . Hypertriglyceridemia 11/19/2012  . Insomnia 03/06/2014  . Low back pain 08/19/2015  . Migraine headache 05/19/2013  . Obesity (BMI 30-39.9) 03/28/2013  . Patellofemoral syndrome, bilateral 08/07/2017  . Pilonidal cyst 03/2013  . Pilonidal disease s/p I&D 03/10/2013 11/19/2012  . Rhinitis 08/07/2017  . 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 Thoracic spondylosis without myelopathy 03/23/2017    Tobacco History: Social History   Tobacco Use  Smoking Status Never Smoker  Smokeless Tobacco Never Used   Counseling given: Yes   Continue to not smoke  Outpatient Encounter Medications as of 11/14/2019  Medication Sig  . albuterol (PROVENTIL) (2.5 MG/3ML) 0.083% nebulizer solution Take 3 mLs (2.5 mg total) by nebulization every 6 (six) hours as needed for wheezing or shortness of breath.  Marland Kitchen albuterol (VENTOLIN HFA) 108 (90 Base) MCG/ACT inhaler Inhale 2 puffs into the lungs every 6 (six) hours as needed for wheezing or shortness of breath.  Marland Kitchen atorvastatin (LIPITOR) 80 MG tablet Take 1 tablet (80 mg total) by mouth daily at 6 PM.  . Dapagliflozin-metFORMIN HCl ER (XIGDUO XR) 04-999 MG TB24 Take 1 tablet by mouth daily.  . fenofibrate 160 MG tablet Take 1 tablet (160 mg total) by mouth daily.  . finasteride (PROPECIA) 1 MG tablet Take 1 tablet (1 mg total) by mouth daily.  . fluticasone (FLONASE) 50 MCG/ACT nasal spray One spray in each nostril twice a day, use left hand for right nostril, and right hand for  left nostril.  Marland Kitchen glipiZIDE (GLUCOTROL) 10 MG tablet Take 1 tablet (10 mg total) by mouth 2 (two) times daily before a meal.  . JANUVIA 100 MG tablet Please specify directions, refills and quantity  . loratadine (CLARITIN) 10 MG tablet Take 1 tablet (10 mg total) by mouth daily.  Marland Kitchen lurasidone (LATUDA) 80 MG TABS tablet Take 1 tablet (80 mg total) by mouth daily with breakfast.  . minoxidil (MINOXIDIL FOR MEN) 2 % external solution Apply topically 2 (two) times daily.  . mometasone-formoterol (DULERA) 200-5 MCG/ACT AERO Inhale 2 puffs into the lungs 2 (two) times daily.  . montelukast (SINGULAIR) 10 MG tablet Take 1 tablet (10 mg total) by mouth at bedtime.  . polyethylene glycol (MIRALAX / GLYCOLAX) 17 g packet Take 17 g by mouth 2 (two) times daily. Until stooling regularly  . Vitamin D, Ergocalciferol, (DRISDOL) 1.25 MG (50000 UNIT) CAPS capsule Take 1 capsule (50,000 Units total) by mouth every 7 (seven) days. Take for 8 total doses(weeks)  . [DISCONTINUED] tiotropium (SPIRIVA) 18 MCG inhalation capsule Place 1 capsule (18 mcg total) into inhaler and inhale daily.   No facility-administered encounter medications on file as of 11/14/2019.     Review of Systems  Review of Systems  Constitutional: Positive for fatigue. Negative for activity change, chills, fever and unexpected weight change.  HENT: Positive for congestion. Negative for postnasal drip, rhinorrhea, sinus pressure, sinus pain and sore throat.   Eyes: Negative.   Respiratory: Positive for cough, chest tightness, shortness of breath and wheezing.   Cardiovascular: Negative for chest pain and palpitations.  Gastrointestinal: Negative for constipation, diarrhea, nausea and vomiting.  Endocrine: Negative.   Genitourinary: Negative.   Musculoskeletal: Negative.   Skin: Negative.   Neurological: Negative for dizziness and headaches.  Psychiatric/Behavioral: Negative.  Negative for dysphoric mood. The patient is not nervous/anxious.    All other systems reviewed and are negative.    Physical Exam  BP 110/64   Pulse 94   Temp 98.3 F (36.8 C) (Temporal)   Ht '5\' 11"'$  (1.803 m)   Wt 204 lb (92.5 kg)   SpO2 97% Comment: on RA  BMI 28.45 kg/m   Wt Readings from Last 5 Encounters:  11/14/19 204 lb (92.5 kg)  10/27/19 198 lb (89.8 kg)  10/20/19 200 lb 9.6 oz (91 kg)  09/18/19 201 lb (91.2 kg)  09/02/19 200 lb (90.7 kg)    BMI Readings from Last 5  Encounters:  11/14/19 28.45 kg/m  10/27/19 27.62 kg/m  10/20/19 27.98 kg/m  09/18/19 28.03 kg/m  09/02/19 27.89 kg/m     Physical Exam Vitals and nursing note reviewed.  Constitutional:      General: He is not in acute distress.    Appearance: Normal appearance. He is normal weight.  HENT:     Head: Normocephalic and atraumatic.     Right Ear: Hearing, tympanic membrane, ear canal and external ear normal. There is no impacted cerumen.     Left Ear: Hearing, tympanic membrane, ear canal and external ear normal. There is no impacted cerumen.     Nose: Nose normal. No mucosal edema or rhinorrhea.     Right Turbinates: Not enlarged.     Left Turbinates: Not enlarged.     Mouth/Throat:     Mouth: Mucous membranes are dry.     Pharynx: Oropharynx is clear. No oropharyngeal exudate.  Eyes:     Pupils: Pupils are equal, round, and reactive to light.  Cardiovascular:     Rate and Rhythm: Normal rate and regular rhythm.     Pulses: Normal pulses.     Heart sounds: Normal heart sounds. No murmur.  Pulmonary:     Effort: Pulmonary effort is normal.     Breath sounds: Normal breath sounds. No decreased breath sounds, wheezing or rales.  Musculoskeletal:     Cervical back: Normal range of motion.     Right lower leg: No edema.     Left lower leg: No edema.  Lymphadenopathy:     Cervical: No cervical adenopathy.  Skin:    General: Skin is warm and dry.     Capillary Refill: Capillary refill takes less than 2 seconds.     Findings: No erythema or rash.    Neurological:     General: No focal deficit present.     Mental Status: He is alert and oriented to person, place, and time.     Motor: No weakness.     Coordination: Coordination normal.     Gait: Gait is intact. Gait normal.  Psychiatric:        Mood and Affect: Mood normal.        Behavior: Behavior normal. Behavior is cooperative.        Thought Content: Thought content normal.        Judgment: Judgment normal.       Assessment & Plan:   Asthma Suspect this is Th2 low asthma Patient reports worsening dyspnea on exertion, cough, wheezing No coughing during exam No wheezing on clinical exam Walk today in office patient tolerated without any oxygen desaturations or symptoms of dyspnea Pulmonary function testing today does not meet ATS standards There is a positive bronchodilator response IgE lab work earlier this year normal Eosinophils normal  Plan: Remain on Dulera 200 as patient finds clinical benefit and he has bronchodilator response on pulmonary function testing Explained to patient and his mother they are not many more options given his intolerance of any other inhaler Patient unable to tolerate Spiriva Remain on Singulair We will discuss with pharmacy team to see if they know of any other options to get patient's [facility] covered   Medication management Patient reports intolerance to all inhalers except for [facility] Patient's insurance does not cover [facility] Patient reports he is does not qualify for a coupon card from [facility] does not show great financial savings  Plan: We will route note to pharmacy team to see if  they have additional suggestions regarding Dulera coverage or if there is any other programs patient could utilize to obtain assistance with drug coverage    Return in about 3 months (around 02/14/2020), or if symptoms worsen or fail to improve, for Follow up with Dr. Carlis Abbott.   Lauraine Rinne, NP 11/14/2019   This appointment required 44  minutes of patient care (this includes precharting, chart review, review of results, face-to-face care, etc.).

## 2019-11-18 NOTE — Progress Notes (Signed)
Central airway thickening likely related to reactive airways disease or patient's chronic bronchitis.  No acute issues.  No pneumonia.No new recommendations or changes.Elisha Headland, FNP

## 2019-11-19 ENCOUNTER — Telehealth: Payer: Self-pay | Admitting: Critical Care Medicine

## 2019-11-19 NOTE — Telephone Encounter (Signed)
Advised pt of results. Pt understood and nothing further is needed.    Coral Ceo, NP  11/18/2019 9:55 PM EDT    Central airway thickening likely related to reactive airways disease or patient's chronic bronchitis. No acute issues. No pneumonia.  No new recommendations or changes.  Elisha Headland, FNP

## 2019-11-20 NOTE — Progress Notes (Signed)
Pt. Called on 5/19 by Delaney Meigs.  Advised pt of results. Pt understood and nothing further is needed.

## 2019-11-21 ENCOUNTER — Telehealth: Payer: Self-pay | Admitting: Pharmacy Technician

## 2019-11-21 NOTE — Telephone Encounter (Signed)
-----   Message from Coral Ceo, NP sent at 11/14/2019  9:18 PM EDT ----- PFTs did not meet ATS standards.  Hard to evaluate asthma symptoms.  Clinical evaluation does not match patient's reported symptoms.  Will route note to pharmacy team to see if they know of any other ways that patient could obtain Dulera at a more cost effective rate since he is paying for this out of pocket due to nonformulary with insurance.[name]

## 2019-11-21 NOTE — Telephone Encounter (Signed)
Will initiate inhaler benefits investigation.

## 2019-11-24 ENCOUNTER — Telehealth: Payer: Self-pay | Admitting: Critical Care Medicine

## 2019-11-24 ENCOUNTER — Telehealth: Payer: Self-pay | Admitting: Pharmacist

## 2019-11-24 DIAGNOSIS — J4520 Mild intermittent asthma, uncomplicated: Secondary | ICD-10-CM

## 2019-11-24 DIAGNOSIS — J4530 Mild persistent asthma, uncomplicated: Secondary | ICD-10-CM

## 2019-11-24 MED ORDER — ALBUTEROL SULFATE HFA 108 (90 BASE) MCG/ACT IN AERS: 2.0000 | INHALATION_SPRAY | Freq: Four times a day (QID) | RESPIRATORY_TRACT | 2 refills | Status: DC | PRN

## 2019-11-24 NOTE — Telephone Encounter (Signed)
Over the phone logistically should be fine.  He does not formally need inhaler teaching that I remember.  If you need additional assistance please route to [facility] CMA and Dr. Chestine Spore for further assistance regarding the patient.[name]

## 2019-11-24 NOTE — Telephone Encounter (Signed)
Triage,Please see the response from pharmacy team below.  Can we please get the patient scheduled with pharmacy team to review options.  From what I remember the patient has attempted to use the inhalers that are preferred by his plan.  And he does not feel like this helps with his breathing.  Sounds like we could either submit a prior authorization for [facility] or the patient can consider this information when applying for insurance next year.Elisha Headland, FNP

## 2019-11-24 NOTE — Telephone Encounter (Signed)
I believe I tried to do a PA earlier this year for dulera and we were denied, but I am happy to try again. I agree with trying advair. Please prescribe advair 500-50 BID and let him know that I recommend trying this for 1 month before considering stopping.  Steffanie Dunn, DO 11/24/19 8:01 PM [facility]

## 2019-11-24 NOTE — Telephone Encounter (Signed)
Spoke with pt's mom called to get more clarification on the xray results. I explained to her the results. Nothing further is needed.  I sent in albuterol inhaler to [facility].

## 2019-11-24 NOTE — Telephone Encounter (Signed)
Does the patient need inhaler education?  If not we can discuss over the phone and determine if an appointment is needed from there.  Next best step would be to do non-formulary PA as patient has tried and failed preferred agents.  We do not handle PA or PAP for non-specialty, just help offer guidance on available options.   Verlin Fester, PharmD, [place], CPP Clinical Specialty Pharmacist (Rheumatology and Pulmonology)  11/24/2019 9:07 AM

## 2019-11-24 NOTE — Telephone Encounter (Signed)
Ran test claim, plan confirms Derrick Carter is non formulary.  Plan preferred are: Advair, Breo, Symbicort. Copays for preferred options- $30 for 1 month supply.  Non-formulary prior authorization can be submitted to plan for [facility].  Thanks! Dorthula Nettles, CPhT

## 2019-11-24 NOTE — Telephone Encounter (Signed)
Called patient on 11/24/2019 at 4:37 PM and left HIPAA-compliant VM with instructions to call [facility] back   [employer] is not on formulary of patent's prescription drug coverage. ICS/LABA agents preferred on insurance formulary are Advair HFA, Advair Diskus, Breo Ellipta, and Symbicort. Each preferred agent has a copay of $30 for 1 month supply.  Patient has previously been on symbicort, arnuity ellipta, breo ellipta, spiriva.   Would recommend switching patient from [facility] to Advair HFA (both are MDI devices) since he has not been on Advair per his medication list.  Future considerations would be 1) switch to Advair Diskus 2) complete nonformulary for [facility] (make sure 1) patient is aware that typically when nonformulary requests are completed then patient is responsible for the copay of the highest tier on insurance formulary (ex: tier 5 or 6) and would be okay with an expensive copay AND 2) nonformulary requests are not always approved)  Thank you for involving pharmacy to assist in providing this patient's care.   Zachery Conch, PharmD PGY2 Ambulatory Care Pharmacy Resident

## 2019-11-26 MED ORDER — FLUTICASONE-SALMETEROL 500-50 MCG/DOSE IN AEPB
1.0000 | INHALATION_SPRAY | Freq: Two times a day (BID) | RESPIRATORY_TRACT | 3 refills | Status: DC
Start: 2019-11-26 — End: 2020-05-11

## 2019-11-26 NOTE — Telephone Encounter (Signed)
ATC patient left detailed message letting him know that RX for Advair was sent to pharmacy and that Dr. Chestine Spore wanted him to try this for 1 month before he considered stopping. Nothing further needed at this time.

## 2019-11-26 NOTE — Addendum Note (Signed)
Addended by: Benjie Karvonen R on: 11/26/2019 08:54 AM   Modules accepted: Orders

## 2019-11-26 NOTE — Telephone Encounter (Signed)
Pharmacist Zachery Conch called the patient yesterday but no answer. Left voicemail.  Appears prescription was sent in for Advair by Dr. Chestine Spore today.  If patient does not tolerate Advair may submit non-formulary PA for [facility], but patient should be advised if approved it will be at the cost of tier 5 drugs.  Nothing further needed.   Verlin Fester, PharmD, [place], CPP Clinical Specialty Pharmacist (Rheumatology and Pulmonology)  11/26/2019 2:09 PM

## 2019-12-31 ENCOUNTER — Encounter: Payer: Self-pay | Admitting: Sports Medicine

## 2019-12-31 ENCOUNTER — Other Ambulatory Visit: Payer: Self-pay

## 2019-12-31 ENCOUNTER — Ambulatory Visit: Payer: BC Managed Care – PPO | Admitting: Sports Medicine

## 2019-12-31 DIAGNOSIS — F319 Bipolar disorder, unspecified: Secondary | ICD-10-CM | POA: Diagnosis not present

## 2019-12-31 MED ORDER — LURASIDONE HCL 120 MG PO TABS: 120.0000 mg | ORAL_TABLET | Freq: Every day | ORAL | 3 refills | Status: DC

## 2019-12-31 NOTE — Assessment & Plan Note (Signed)
Ceylon returns, he has bipolar 2 disorder, he did well on Seroquel, this improved his insomnia, aggression, flight of ideas, citalopram was discontinued, he did continue to have increasing mild depressive symptoms without suicidal or homicidal ideation, 2 visits ago we overhauled his entire regimen, and we switched to [place]. He feels as though Kasandra Knudsen is the best medication he has taken thus far, we have been titrating the dose, he is doing okay, but feels as though we do need to go up from 80, we can do 120, I will see him back in 6 weeks and if persistent symptoms we will go to 160. Certainly if this fails he will need psychiatric involvement.

## 2019-12-31 NOTE — Progress Notes (Signed)
    Procedures performed today:    None.  Independent interpretation of notes and tests performed by another provider:   None.  Brief History, Exam, Impression, and Recommendations:    Bipolar disorder (HCC) Elonzo returns, he has bipolar 2 disorder, he did well on Seroquel, this improved his insomnia, aggression, flight of ideas, citalopram was discontinued, he did continue to have increasing mild depressive symptoms without suicidal or homicidal ideation, 2 visits ago we overhauled his entire regimen, and we switched to Jordan. He feels as though Kasandra Knudsen is the best medication he has taken thus far, we have been titrating the dose, he is doing okay, but feels as though we do need to go up from 80, we can do 120, I will see him back in 6 weeks and if persistent symptoms we will go to 160. Certainly if this fails he will need psychiatric involvement.     ___________________________________________ Ihor Austin. Benjamin Stain, M.D., ABFM., CAQSM. Primary Care and Sports Medicine [facility]  Adjunct Instructor of Family Medicine  [employer]

## 2020-01-29 ENCOUNTER — Other Ambulatory Visit: Payer: Self-pay | Admitting: Sports Medicine

## 2020-01-29 DIAGNOSIS — E119 Type 2 diabetes mellitus without complications: Secondary | ICD-10-CM

## 2020-02-15 ENCOUNTER — Other Ambulatory Visit: Payer: Self-pay | Admitting: Sports Medicine

## 2020-02-15 DIAGNOSIS — F39 Unspecified mood [affective] disorder: Secondary | ICD-10-CM

## 2020-02-23 ENCOUNTER — Other Ambulatory Visit: Payer: Self-pay | Admitting: Sports Medicine

## 2020-02-23 DIAGNOSIS — E119 Type 2 diabetes mellitus without complications: Secondary | ICD-10-CM

## 2020-03-28 IMAGING — DX DG CHEST 2V
2 series · 2 of 2 positions shown · non-contrast
Comparison: PA and lateral chest x-ray June 06, 2017

CLINICAL DATA: Two months of shortness of breath. History of
diabetes and hyperlipidemia. Nonsmoker.

EXAM:
CHEST - 2 VIEW

[chest pa]
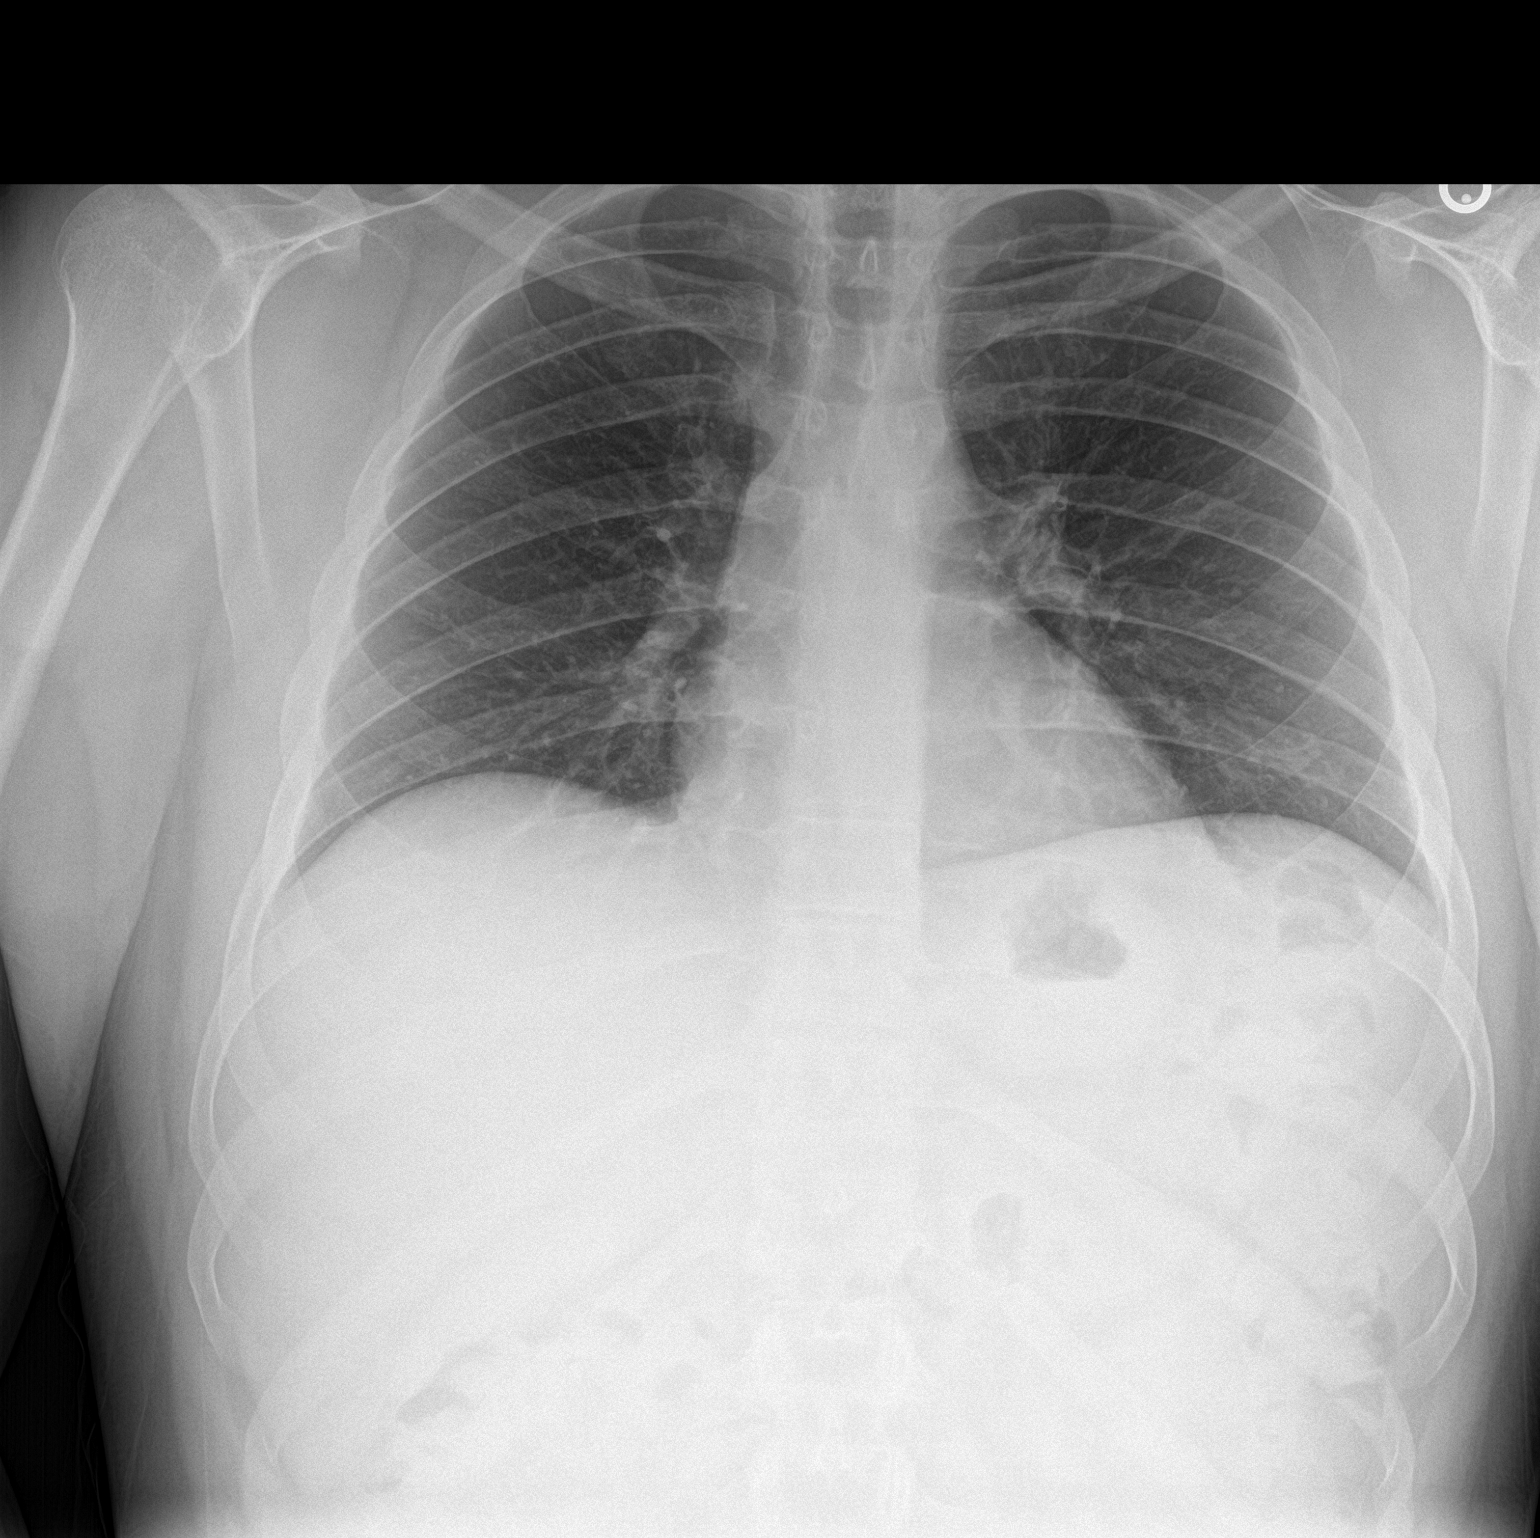

[chest lat]
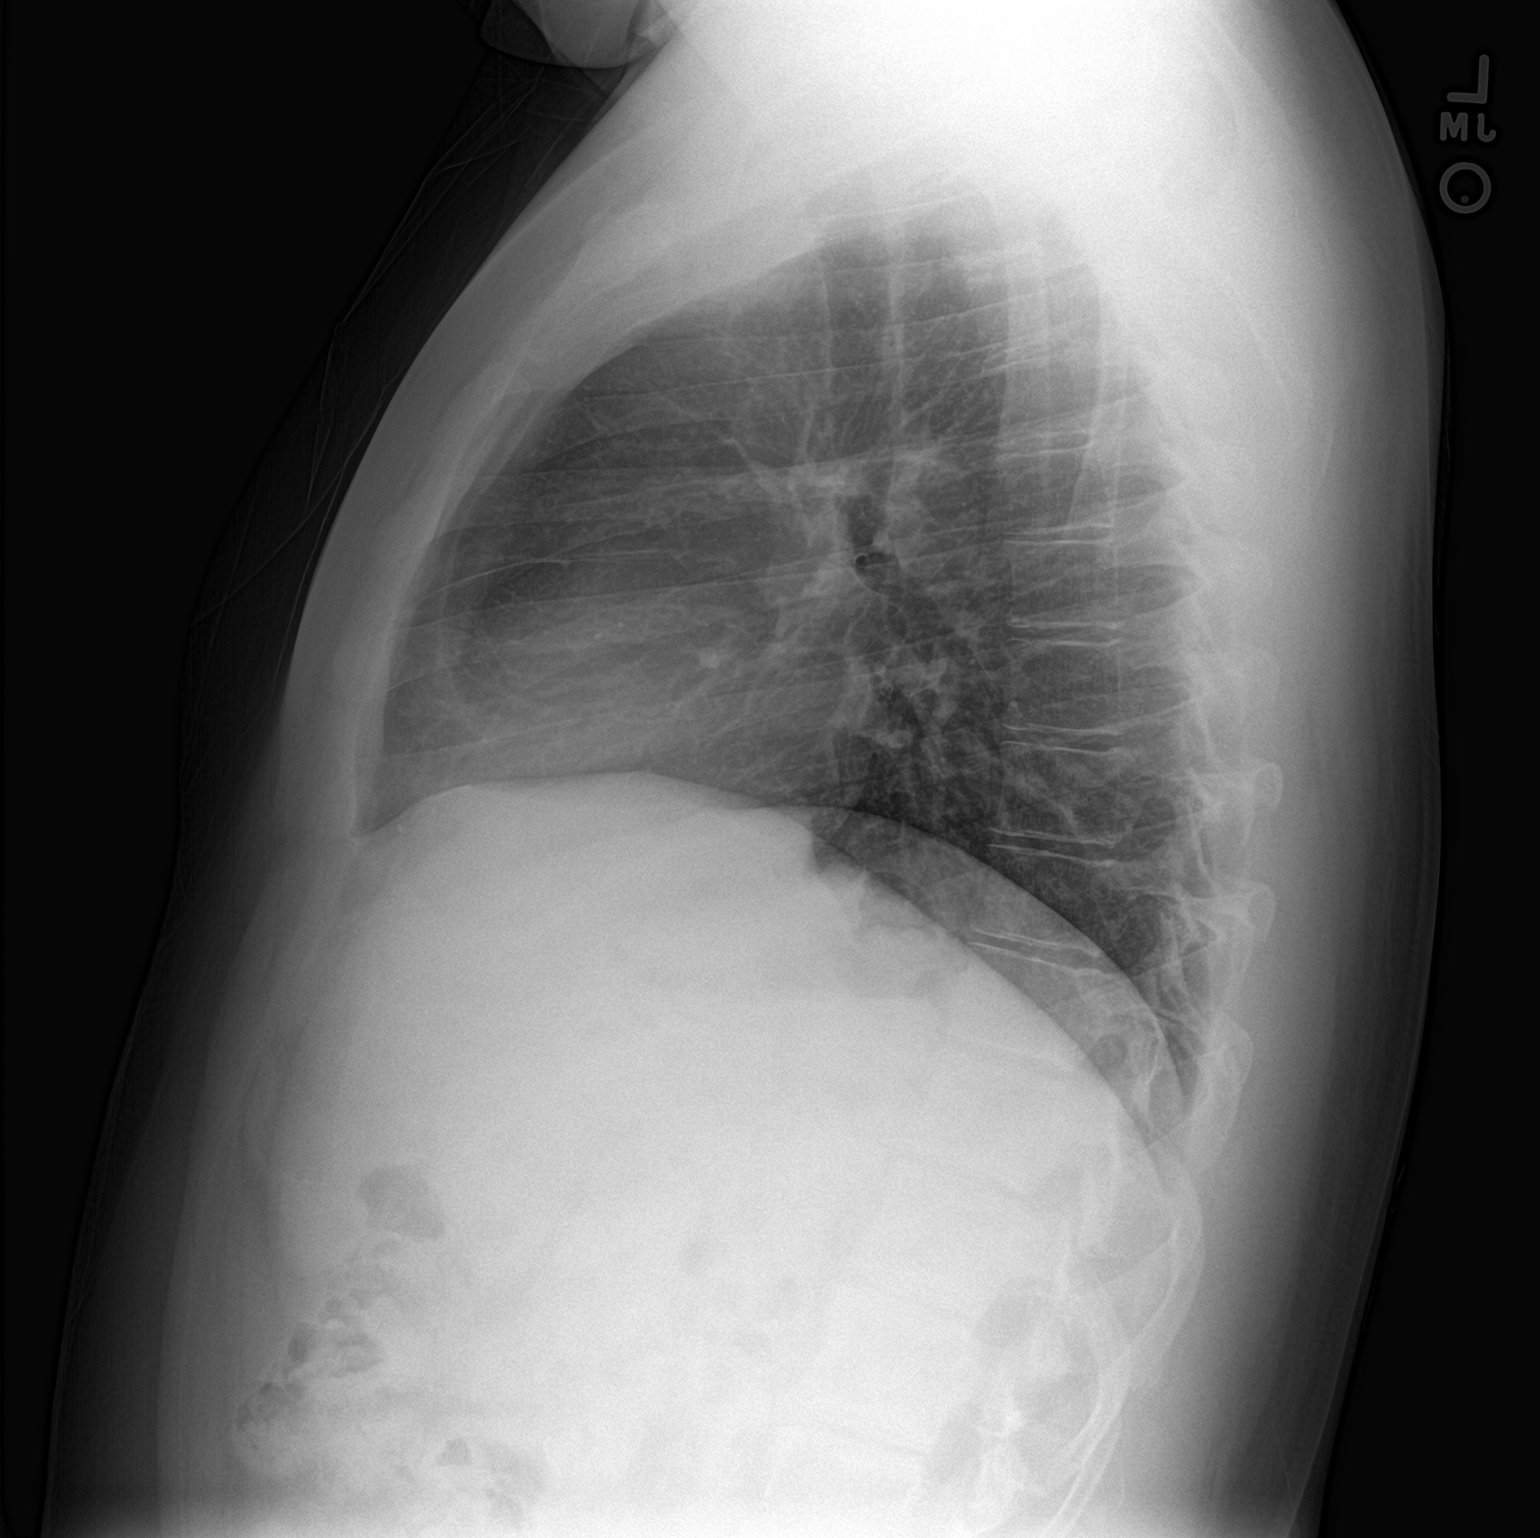

[2 of 2 positions shown; findings below may reference images not displayed]

FINDINGS: The lungs are borderline hypoinflated but clear. The heart and
pulmonary vascularity are normal. The mediastinum is normal in
width. There is no pleural effusion. The bony thorax exhibits no
acute abnormality.
IMPRESSION: There is no active cardiopulmonary disease.

## 2020-04-24 ENCOUNTER — Other Ambulatory Visit: Payer: Self-pay | Admitting: Critical Care Medicine

## 2020-04-24 DIAGNOSIS — J4530 Mild persistent asthma, uncomplicated: Secondary | ICD-10-CM

## 2020-05-04 ENCOUNTER — Other Ambulatory Visit: Payer: Self-pay | Admitting: Sports Medicine

## 2020-05-04 DIAGNOSIS — F319 Bipolar disorder, unspecified: Secondary | ICD-10-CM

## 2020-05-04 DIAGNOSIS — F5105 Insomnia due to other mental disorder: Secondary | ICD-10-CM

## 2020-05-04 DIAGNOSIS — F39 Unspecified mood [affective] disorder: Secondary | ICD-10-CM

## 2020-05-05 LAB — HM DIABETES EYE EXAM

## 2020-05-10 ENCOUNTER — Other Ambulatory Visit: Payer: Self-pay | Admitting: Sports Medicine

## 2020-05-10 DIAGNOSIS — L649 Androgenic alopecia, unspecified: Secondary | ICD-10-CM

## 2020-05-11 ENCOUNTER — Ambulatory Visit: Payer: BC Managed Care – PPO | Admitting: Sports Medicine

## 2020-05-11 VITALS — BP 121/81 | HR 85 | Ht 71.0 in | Wt 230.0 lb

## 2020-05-11 DIAGNOSIS — Z0000 Encounter for general adult medical examination without abnormal findings: Secondary | ICD-10-CM

## 2020-05-11 DIAGNOSIS — Z23 Encounter for immunization: Secondary | ICD-10-CM | POA: Diagnosis not present

## 2020-05-11 DIAGNOSIS — E119 Type 2 diabetes mellitus without complications: Secondary | ICD-10-CM

## 2020-05-11 LAB — POCT GLYCOSYLATED HEMOGLOBIN (HGB A1C): Hemoglobin A1C: 7.8 % — AB (ref 4.0–5.6)

## 2020-05-11 NOTE — Assessment & Plan Note (Signed)
Flu shot today 

## 2020-05-11 NOTE — Progress Notes (Signed)
    Procedures performed today:    None.  Independent interpretation of notes and tests performed by another provider:   None.  Brief History, Exam, Impression, and Recommendations:    Diabetes mellitus, type 2 Jayquan returns, he continues with his current medications, he cut out sodas, and his A1c is now well controlled, no changes. Diabetic foot exam done today.  Annual physical exam Flu shot today.  I spent 30 minutes of total time managing this patient today, this includes chart review, face to face, and non-face to face time.   ___________________________________________ Ihor Austin. Benjamin Stain, M.D., ABFM., CAQSM. Primary Care and Sports Medicine [facility]  Adjunct Instructor of Family Medicine  [employer]

## 2020-05-11 NOTE — Assessment & Plan Note (Signed)
Kaion returns, he continues with his current medications, he cut out sodas, and his A1c is now well controlled, no changes. Diabetic foot exam done today.

## 2020-06-08 ENCOUNTER — Ambulatory Visit: Payer: BC Managed Care – PPO | Admitting: Sports Medicine

## 2020-06-17 ENCOUNTER — Other Ambulatory Visit: Payer: Self-pay | Admitting: Sports Medicine

## 2020-06-17 DIAGNOSIS — E119 Type 2 diabetes mellitus without complications: Secondary | ICD-10-CM

## 2020-06-21 ENCOUNTER — Other Ambulatory Visit: Payer: Self-pay | Admitting: Sports Medicine

## 2020-06-23 ENCOUNTER — Other Ambulatory Visit: Payer: Self-pay | Admitting: Critical Care Medicine

## 2020-06-23 DIAGNOSIS — J4530 Mild persistent asthma, uncomplicated: Secondary | ICD-10-CM

## 2020-06-29 ENCOUNTER — Ambulatory Visit: Payer: BC Managed Care – PPO | Admitting: Sports Medicine

## 2020-06-29 ENCOUNTER — Other Ambulatory Visit: Payer: Self-pay

## 2020-06-29 ENCOUNTER — Encounter: Payer: Self-pay | Admitting: Sports Medicine

## 2020-06-29 ENCOUNTER — Ambulatory Visit (INDEPENDENT_AMBULATORY_CARE_PROVIDER_SITE_OTHER): Payer: BC Managed Care – PPO

## 2020-06-29 DIAGNOSIS — H02402 Unspecified ptosis of left eyelid: Secondary | ICD-10-CM

## 2020-06-29 NOTE — Assessment & Plan Note (Addendum)
Unclear etiology, noted by his optometrist, fairly normal neurologic exam, it sounds as though the signs of been present now for several years. He does have a bit of ptosis in his left eyelid but it is very mild, the rest of his cranial nerves are unremarkable. He is referred to [place] from his optometrist for consideration of brain MRI. We will also do a chest x-ray looking for a potential source of Horner syndrome, as well as myasthenia gravis antibodies.

## 2020-06-29 NOTE — Progress Notes (Signed)
    Procedures performed today:    None.  Independent interpretation of notes and tests performed by another provider:   None.  Brief History, Exam, Impression, and Recommendations:    Ptosis, left eyelid Unclear etiology, noted by his optometrist, fairly normal neurologic exam, it sounds as though the signs of been present now for several years. He does have a bit of ptosis in his left eyelid but it is very mild, the rest of his cranial nerves are unremarkable. He is referred to [place] from his optometrist for consideration of brain MRI. We will also do a chest x-ray looking for a potential source of Horner syndrome, as well as myasthenia gravis antibodies.     ___________________________________________ Derrick Carter. Derrick Carter, M.D., ABFM., CAQSM. Primary Care and Sports Medicine [facility]  Adjunct Instructor of Family Medicine  [employer]

## 2020-06-30 ENCOUNTER — Telehealth: Payer: Self-pay

## 2020-06-30 NOTE — Telephone Encounter (Signed)
Pt lvm stating he was returning the call to someone concerning results.

## 2020-07-01 ENCOUNTER — Other Ambulatory Visit: Payer: Self-pay

## 2020-07-01 ENCOUNTER — Ambulatory Visit: Payer: BC Managed Care – PPO | Admitting: Podiatry

## 2020-07-01 DIAGNOSIS — L84 Corns and callosities: Secondary | ICD-10-CM

## 2020-07-01 DIAGNOSIS — M7742 Metatarsalgia, left foot: Secondary | ICD-10-CM | POA: Diagnosis not present

## 2020-07-01 DIAGNOSIS — R52 Pain, unspecified: Secondary | ICD-10-CM

## 2020-07-01 DIAGNOSIS — M7741 Metatarsalgia, right foot: Secondary | ICD-10-CM | POA: Diagnosis not present

## 2020-07-01 NOTE — Patient Instructions (Signed)
Look for urea 40% cream or ointment and apply to the thickened dry skin / calluses. This can be bought over the counter, at a pharmacy or online such as [employer].  

## 2020-07-03 NOTE — Progress Notes (Signed)
  Subjective:  Patient ID: Derrick Carter, male    DOB: 05-26-1991,  MRN: [id number]  Chief Complaint  Patient presents with  . Callouses    Bilateral callous and pt stated that he needs special insoles for his boots.    30 y.o. male presents with the above complaint. History confirmed with patient.  He works at a [employer] has to wear boots for work.  He previously had inserts made by another podiatrist and would like to have new ones made.  He gets painful calluses on both feet.  Objective:  Physical Exam: warm, good capillary refill, no trophic changes or ulcerative lesions, normal DP and PT pulses and normal sensory exam. Left Foot: Hyperkeratosis submet 5 left, painful to palpation Right Foot: Hyperkeratosis submet 4 and 5 on the right, painful to palpation  Assessment:   1. Callus of foot   2. Pain   3. Metatarsalgia of both feet      Plan:  Patient was evaluated and treated and all questions answered.   All symptomatic hyperkeratoses were safely debrided with a sterile #15 blade to patient's level of comfort without incident. We discussed preventative and palliative care of these lesions including supportive and accommodative shoegear, padding, prefabricated and custom molded accommodative orthoses, use of a pumice stone and lotions/creams daily.  Advised to use urea cream twice daily  Think he would benefit from a new pair of custom molded orthoses and offloading of these areas where the lesions are.  Return if symptoms worsen or fail to improve.

## 2020-07-04 LAB — COMPREHENSIVE METABOLIC PANEL
AG Ratio: 1.9 (calc) (ref 1.0–2.5)
ALT: 42 U/L (ref 9–46)
AST: 21 U/L (ref 10–40)
Albumin: 4.7 g/dL (ref 3.6–5.1)
Alkaline phosphatase (APISO): 50 U/L (ref 36–130)
BUN: 23 mg/dL (ref 7–25)
CO2: 27 mmol/L (ref 20–32)
Calcium: 9.8 mg/dL (ref 8.6–10.3)
Chloride: 106 mmol/L (ref 98–110)
Creat: 0.76 mg/dL (ref 0.60–1.35)
Globulin: 2.5 g/dL (calc) (ref 1.9–3.7)
Glucose, Bld: 130 mg/dL — ABNORMAL HIGH (ref 65–99)
Potassium: 4 mmol/L (ref 3.5–5.3)
Sodium: 142 mmol/L (ref 135–146)
Total Bilirubin: 0.7 mg/dL (ref 0.2–1.2)
Total Protein: 7.2 g/dL (ref 6.1–8.1)

## 2020-07-04 LAB — CBC
HCT: 49.1 % (ref 38.5–50.0)
Hemoglobin: 17.1 g/dL (ref 13.2–17.1)
MCH: 31.1 pg (ref 27.0–33.0)
MCHC: 34.8 g/dL (ref 32.0–36.0)
MCV: 89.3 fL (ref 80.0–100.0)
MPV: 10.3 fL (ref 7.5–12.5)
Platelets: 276 10*3/uL (ref 140–400)
RBC: 5.5 10*6/uL (ref 4.20–5.80)
RDW: 12.4 % (ref 11.0–15.0)
WBC: 10.1 10*3/uL (ref 3.8–10.8)

## 2020-07-04 LAB — MYASTHENIA GRAVIS PANEL 1
A CHR BINDING ABS: <0.3 nmol/L
STRIATED MUSCLE AB SCREEN: NEGATIVE

## 2020-07-04 LAB — TSH: TSH: 2.24 mIU/L (ref 0.40–4.50)

## 2020-07-07 ENCOUNTER — Telehealth: Payer: Self-pay | Admitting: Critical Care Medicine

## 2020-07-07 NOTE — Telephone Encounter (Signed)
Called and spoke with patients mother who states that the pharmacy states they aren't getting any Dulera and they are having a hard time getting it. Informed her that there has been an issue with getting Dulera and I wasn't sure if they are making it anymore. I advised her that I would check with provider to get recommendations and we would let her know.   [name] please advise

## 2020-07-08 MED ORDER — BUDESONIDE-FORMOTEROL FUMARATE 160-4.5 MCG/ACT IN AERO
2.0000 | INHALATION_SPRAY | Freq: Two times a day (BID) | RESPIRATORY_TRACT | 1 refills | Status: DC
Start: 2020-07-08 — End: 2020-07-28

## 2020-07-08 NOTE — Telephone Encounter (Signed)
I called and spoke with the patient's mother, Jules Husbands, listed on the [facility], advised of recommendations per Elisha Headland NP.  Schedule first available 30 minute slot with Dr. Celine Mans for new patient.  Advised mother to have him call his insurance company and see what comparable inhaler is on the preferred list.  Mother advised that Elwin Sleight is all that has worked for him and that insurance did not cover it so he was paying out of pocket $300 and just wants [place] to prescribe something comparable so he does not run out before his appointment with Dr. Celine Mans.  She will have the patient call his insurance company as well.  [name], Can you recommend a comparable inhaler that can be sent to his pharmacy until he sees Dr. Celine Mans 07/28/20?  Thanks.

## 2020-07-08 NOTE — Telephone Encounter (Signed)
07/08/20  Can recommend either:  Symbicort 160 >>> 2 puffs in the morning right when you wake up, rinse out your mouth after use, 12 hours later 2 puffs, rinse after use >>> Take this daily, no matter what >>> This is not a rescue inhaler   Or   Breo Ellipta 200 >>> Take 1 puff daily in the morning right when you wake up >>>Rinse your mouth out after use >>>This is a daily maintenance inhaler, NOT a rescue inhaler >>>Contact our office if you are having difficulties affording or obtaining this medication >>>It is important for you to be able to take this daily and not miss any doses    I would favor in for for Symbicort 160 as this would be the most similar to patient's Dulera.  Okay to send this prescription if they are agreeable to this.  Provide the recommendations as above.  Keep upcoming appointment with Dr. Celine Mans.  Elisha Headland, FNP

## 2020-07-08 NOTE — Telephone Encounter (Signed)
07/08/2020  Patient needs appointment in office with new pulmonologist as Dr. Chestine Spore is no longer seeing patients in clinic.  Would recommend establishing with either Dr. Celine Mans, Dr. Judeth Horn or Dr. Francine Graven.  Yes from my understanding Elwin Sleight is no longer being manufactured.  We can always check with our pharmacy team where the patient can check with their pharmacy.  This has been an issue in the past with the patient in order to obtain an inhaler that they feel like is beneficial.  They likely will have to switch inhalers.  This will be best discussed in office with patient's new pulmonary provider in a 30-minute time slot.  Elisha Headland, FNP

## 2020-07-08 NOTE — Telephone Encounter (Signed)
Called and spoke with pts mother and she is aware of the symbicort that has been sent to the pharmacy and that the pt will need to rinse after each use.  He is to keep the upcoming appt with ND.  Nothing further is needed.

## 2020-07-13 ENCOUNTER — Telehealth: Payer: Self-pay | Admitting: Critical Care Medicine

## 2020-07-13 NOTE — Telephone Encounter (Signed)
Called spoke with Selena Batten. Shared Brian's recommendations. She said she would rather see the doctor on the 26th. If she needs anything until then they will call patient's PCP.   Nothing further needed at this time.

## 2020-07-13 NOTE — Telephone Encounter (Signed)
07/13/2020  Patient needs to follow-up with primary care or be seen in office.  As explained on multiple triage messages in the past it is extremely difficult for [place] to manage this virtually.  Patient has multiple intolerances to previous ICS/LABA inhalers.  Dulera which is the only inhaler that has worked for the patient in the past is no longer being manufactured.  There is nothing that we can do to fix this unfortunately for the patient.  Another option that could be considered are nebulized ICS and nebulized LABA.  This is something that will need to be discussed in office.  Would recommend keeping the upcoming appointment with Dr. Celine Mans.  In the meantime patient can be seen by urgent care, primary care, or an APP in our office to further review.  This acute visit would not be in replacement of the upcoming appointment with Dr. Celine Mans to establish care.  Elisha Headland FNP

## 2020-07-13 NOTE — Telephone Encounter (Signed)
Called spoke with [name], she states patient is having to breathe harder on the symbicort has tried for a week doing the appropriate instructions. Not working wants to know what else can be done.   [name] please advise.

## 2020-07-15 ENCOUNTER — Other Ambulatory Visit: Payer: BC Managed Care – PPO | Admitting: Orthotics

## 2020-07-22 ENCOUNTER — Other Ambulatory Visit: Payer: Self-pay | Admitting: Sports Medicine

## 2020-07-22 DIAGNOSIS — E119 Type 2 diabetes mellitus without complications: Secondary | ICD-10-CM

## 2020-07-28 ENCOUNTER — Encounter: Payer: Self-pay | Admitting: Internal Medicine

## 2020-07-28 ENCOUNTER — Other Ambulatory Visit: Payer: Self-pay

## 2020-07-28 ENCOUNTER — Ambulatory Visit: Payer: BC Managed Care – PPO | Admitting: Internal Medicine

## 2020-07-28 VITALS — BP 118/70 | HR 52 | Ht 71.0 in | Wt 231.6 lb

## 2020-07-28 DIAGNOSIS — J4540 Moderate persistent asthma, uncomplicated: Secondary | ICD-10-CM | POA: Diagnosis not present

## 2020-07-28 MED ORDER — BREO ELLIPTA 200-25 MCG/INH IN AEPB: 1.0000 | INHALATION_SPRAY | Freq: Every day | RESPIRATORY_TRACT | 3 refills | Status: DC

## 2020-07-28 NOTE — Patient Instructions (Addendum)
The patient should have follow up scheduled with myself in 3 months.   Start taking Breo once a day for your asthma. Stop symbicort.   Take the albuterol rescue inhaler every 4 to 6 hours as needed for wheezing or shortness of breath. You can also take it 15 minutes before exercise or exertional activity. Side effects include heart racing or pounding, jitters or anxiety. If you have a history of an irregular heart rhythm, it can make this worse. Can also give some patients a hard time sleeping.  Check the back of the inhaler to keep track of the total number of doses left on the inhaler.

## 2020-07-28 NOTE — Progress Notes (Signed)
Derrick Carter    [id number]    1991-04-22  Primary Care Physician:Thekkekandam, Gwen Her, MD Date of Appointment: 07/28/2020 Established Patient Visit  Chief complaint:   Chief Complaint  Patient presents with  . Follow-up    Pt states he had been doing okay until he ran out of the [facility] as Ruthe Mannan is no longer being manufactured. Pt has been having problems with wheezing and SOB since switched from [facility] to Symbicort.     HPI: Derrick Carter is a 30 y.o. gentleman with asthma and chronic rhinitis. Additional medical history of ADHD, bipolar disorder, Type 2 DM. Diagnosed with asthma in his early 25s.   Interval Updates: Former patient of Dr. Carlis Abbott. Establishing with me today. Has previously been well controlled on asthma while on dulera but this has not been available lately and since then his symptoms have been poorly controlled.   Has been taking symbicort but isn't sure it's working. Has had episodes of wheezing  Symptoms triggered by sitting down, walking, eating.   Current Regimen: Symbicort twice a day, albuterol prn Asthma Triggers: Exacerbations in the last year: never had to take.  History of hospitalization or intubation: never been hospitalized Allergy Testing: never had GERD: denies Allergic Rhinitis: in spring time, takes allegra seasonally ACT:  Asthma Control Test ACT Total Score  07/28/2020 13   FeNO: not obtained.   He works as a Curator.   I have reviewed the patient's family social and past medical history and updated as appropriate.   Past Medical History:  Diagnosis Date  . Acanthosis 08/24/2014  . ADHD (attention deficit hyperactivity disorder)   . Annual physical exam 04/28/2014  . Attention deficit hyperactivity disorder (ADHD) 02/04/2008   Overview:  ADHD, Combined Type  ICD-10 cut over   Last Assessment & Plan:  Relevant Hx: Course: Daily Update: Today's Plan: restart Strattera  . Callus of foot 08/07/2017  . Chest pain  10/30/2017  . Chronic myofascial pain 03/23/2017  . Chronic pain disorder 03/23/2017  . Dental crown present   . Diabetes mellitus without complication ([place])   . Diabetes mellitus, type 2 ([place]) 04/29/2014  . Hyperlipidemia   . Hypertriglyceridemia 11/19/2012  . Insomnia 03/06/2014  . Low back pain 08/19/2015  . Migraine headache 05/19/2013  . Obesity (BMI 30-39.9) 03/28/2013  . Patellofemoral syndrome, bilateral 08/07/2017  . Pilonidal cyst 03/2013  . Pilonidal disease s/p I&D 03/10/2013 11/19/2012  . Rhinitis 08/07/2017  . 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 Thoracic spondylosis without myelopathy 03/23/2017    Past Surgical History:  Procedure Laterality Date  . LEFT HEART CATH AND CORONARY ANGIOGRAPHY N/A 12/24/2017   Procedure: LEFT HEART CATH AND CORONARY ANGIOGRAPHY;  Surgeon: Nelva Bush, MD;  Location: [place] [facility];  Service: Cardiovascular;  Laterality: N/A;  . NO PAST SURGERIES    . PILONIDAL CYST EXCISION N/A 03/10/2013   Procedure: CYST EXCISION PILONIDAL SIMPLE I and D ;  Surgeon: Merrie Roof, MD;  Location: [place];  Service: General;  Laterality: N/A;    Family History  Problem Relation Age of Onset  . Hyperlipidemia Mother   . Heart disease Mother   . Diabetes Mother   . Hypertension Mother   . Diabetes Maternal Grandmother   . Hyperlipidemia Maternal Grandmother   . Hyperlipidemia Maternal Grandfather     Social History   Occupational History  . Not on file  Tobacco Use  .  Smoking status: Never Smoker  . Smokeless tobacco: Never Used  Vaping Use  . Vaping Use: Never used  Substance and Sexual Activity  . Alcohol use: No  . Drug use: No  . Sexual activity: Never     Physical Exam: Blood pressure 118/70, pulse (!) 52, height 5' 11"  (1.803 m), weight 231 lb 9.6 oz (105.1 kg), SpO2 97 %.  Gen:      No acute distress ENT:  no nasal polyps, mucus membranes moist, mallampati IV Lungs:    No increased respiratory effort, symmetric  chest wall excursion, clear to auscultation bilaterally, no wheezes or crackles CV:         Regular rate and rhythm; no murmurs, rubs, or gallops.  No pedal edema   Data Reviewed: Imaging: I have personally reviewed the chest xray Dec 2021 - no acute process. CT Chest in May 2019 shows no pulmonary process.   PFTs:  PFT Results Latest Ref Rng & Units 11/14/2019 02/05/2018  FVC-Pre L 3.54 4.56  FVC-Predicted Pre % 62 90  FVC-Post L 4.26 4.48  FVC-Predicted Post % 74 88  Pre FEV1/FVC % % 88 81  Post FEV1/FCV % % 86 85  FEV1-Pre L 3.11 3.68  FEV1-Predicted Pre % 66 87  FEV1-Post L 3.67 3.81  DLCO uncorrected ml/min/mmHg 27.09 31.75  DLCO UNC% % 79 112  DLVA Predicted % 111 120  TLC L 4.72 6.84  TLC % Predicted % 66 108  RV % Predicted % 48 126   I have personally reviewed the patient's PFTs and spirometry shows no airflow limitation.   Labs: IgE not elevated No eosinophilia  Immunization status: Immunization History  Administered Date(s) Administered  . DTaP 07/13/1992, 09/26/1992, 11/12/1992, 11/25/1993, 07/15/1996  . HPV Quadrivalent 04/13/2010, 10/21/2010, 09/06/2011  . Hepatitis A, Ped/Adol-2 Dose 01/24/2006, 08/21/2006  . Hepatitis B, ped/adol 04/06/92, 07/13/1992, 11/12/1992  . HiB (PRP-OMP) 07/13/1992, 09/21/1992, 11/12/1992, 08/15/1993  . IPV 07/13/1992, 09/21/1992, 11/25/1993, 07/15/1996  . Influenza Nasal 03/31/2011  . Influenza,Quad,Nasal, Live 04/16/2012  . Influenza,inj,Quad PF,6+ Mos 05/07/2013, 04/28/2014, 04/01/2015, 05/01/2016, 03/25/2017, 03/28/2018, 04/10/2019, 05/11/2020  . Influenza,inj,quad, With Preservative 05/20/2008, 04/22/2009, 04/13/2010  . Influenza-Unspecified 05/20/2008, 04/22/2009, 04/13/2010  . MMR 08/15/1993, 07/15/1996  . Meningococcal Conjugate 01/24/2006, 04/13/2010  . Moderna Sars-Covid-2 Vaccination 07/23/2019, 08/26/2019  . Pneumococcal Polysaccharide-23 05/05/2014, 04/10/2019  . Td 05/19/2004, 02/11/2008  . Tdap 02/11/2008,  11/16/2015  . Varicella 05/12/1994    Assessment:  Moderate persistent asthma - suspect non TH2 asthma - not well controlled Seasonal allergic rhinitis   Plan/Recommendations: Stop symbicort Start Breo once daily.  Continue albuterol prn Continue prn antihistamines for rhinitis.   Return to Care: Return in about 3 months (around 10/26/2020).   Lenice Llamas, MD Pulmonary and [place]

## 2020-08-02 ENCOUNTER — Ambulatory Visit (INDEPENDENT_AMBULATORY_CARE_PROVIDER_SITE_OTHER): Payer: BC Managed Care – PPO

## 2020-08-02 ENCOUNTER — Other Ambulatory Visit: Payer: Self-pay

## 2020-08-02 DIAGNOSIS — H02402 Unspecified ptosis of left eyelid: Secondary | ICD-10-CM

## 2020-08-02 MED ORDER — GADOBUTROL 1 MMOL/ML IV SOLN
10.0000 mL | Freq: Once | INTRAVENOUS | Status: AC | PRN
  Administered 2020-08-02: 10 mL via INTRAVENOUS

## 2020-08-03 ENCOUNTER — Other Ambulatory Visit: Payer: Self-pay | Admitting: Orthotics

## 2020-08-06 ENCOUNTER — Telehealth: Payer: Self-pay | Admitting: Internal Medicine

## 2020-08-06 NOTE — Telephone Encounter (Signed)
Sorry to hear that he is not feeling any better.  Will send message to DR. Desai for consideration of changing his inhaler.  It appears that he has been on a couple of inhalers with Symbicort and Breo.  We will need to decide if additional considerations are needed Would also recommend to use his rescue inhaler albuterol as needed. If not improving will need sooner follow-up and work in for acute visit  Please contact office for sooner follow up if symptoms do not improve or worsen or seek emergency care

## 2020-08-06 NOTE — Telephone Encounter (Signed)
Called and spoke with pts mother and she said that the pt was seen by ND on 1/26 and started on Breo.  She said that his breathing is not better and he is still wheezing and is wanting to try something else.  ND is out of the office.  Will send over to TP for further recs.

## 2020-08-10 ENCOUNTER — Other Ambulatory Visit: Payer: Self-pay | Admitting: Sports Medicine

## 2020-08-10 DIAGNOSIS — F319 Bipolar disorder, unspecified: Secondary | ICD-10-CM

## 2020-08-10 DIAGNOSIS — F39 Unspecified mood [affective] disorder: Secondary | ICD-10-CM

## 2020-08-11 MED ORDER — FLUTICASONE-SALMETEROL 250-50 MCG/DOSE IN AEPB: 1.0000 | INHALATION_SPRAY | Freq: Two times a day (BID) | RESPIRATORY_TRACT | 5 refills | Status: DC

## 2020-08-11 NOTE — Telephone Encounter (Signed)
Called and spoke with pt's mother Selena Batten letting her know that Dr. Celine Mans said to switch pt back to advair and that Rx had been sent to pharmacy for him. Kim verbalized understanding. Nothing further needed.

## 2020-08-11 NOTE — Telephone Encounter (Signed)
We can go back to advair diskus. He reportedly did well with this in the past. I have sent it to the pharmacy. Please let him know.

## 2020-08-26 ENCOUNTER — Ambulatory Visit: Payer: BC Managed Care – PPO | Admitting: Internal Medicine

## 2020-09-14 ENCOUNTER — Encounter: Payer: Self-pay | Admitting: Sports Medicine

## 2020-10-05 ENCOUNTER — Other Ambulatory Visit: Payer: Self-pay | Admitting: Sports Medicine

## 2020-10-05 DIAGNOSIS — E119 Type 2 diabetes mellitus without complications: Secondary | ICD-10-CM

## 2020-11-08 ENCOUNTER — Other Ambulatory Visit: Payer: Self-pay | Admitting: Sports Medicine

## 2020-11-08 DIAGNOSIS — E782 Mixed hyperlipidemia: Secondary | ICD-10-CM

## 2020-11-08 DIAGNOSIS — E781 Pure hyperglyceridemia: Secondary | ICD-10-CM

## 2020-11-09 ENCOUNTER — Ambulatory Visit: Payer: BC Managed Care – PPO | Admitting: Sports Medicine

## 2020-11-23 ENCOUNTER — Ambulatory Visit: Payer: BC Managed Care – PPO | Admitting: Sports Medicine

## 2020-12-20 ENCOUNTER — Ambulatory Visit: Payer: BC Managed Care – PPO | Admitting: Sports Medicine

## 2020-12-29 ENCOUNTER — Ambulatory Visit: Payer: BC Managed Care – PPO | Admitting: Sports Medicine

## 2020-12-29 ENCOUNTER — Other Ambulatory Visit: Payer: Self-pay

## 2020-12-29 DIAGNOSIS — S83207D Unspecified tear of unspecified meniscus, current injury, left knee, subsequent encounter: Secondary | ICD-10-CM

## 2020-12-29 DIAGNOSIS — K582 Mixed irritable bowel syndrome: Secondary | ICD-10-CM

## 2020-12-29 DIAGNOSIS — E119 Type 2 diabetes mellitus without complications: Secondary | ICD-10-CM

## 2020-12-29 NOTE — Assessment & Plan Note (Addendum)
Rechecking routine labs. Is having some increasing diarrhea, I suspect this is related to his Amitiza which I have asked him to stop, and his mixed IBS. If colonoscopy is normal and continues to have diarrhea we may consider discontinuing the metformin component of his diabetes medicines. Adding urine microalbumin, he is due.

## 2020-12-29 NOTE — Assessment & Plan Note (Addendum)
Prem returns, he has frequent bloating, occasional diarrhea, occasional constipation, he has worked with gastroenterology, they have tried switching to Amitiza low-dose which causes excessive diarrhea, Bentyl, nothing seems to help significantly. I reassured them today that a high-fiber low FODMAP diet can be helpful, biofeedback can be helpful and that this will ultimately be a disease process that he will need to learn to live with. He did have a bit of transaminitis with an ALT in the 50s, rechecking that now. 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 Due to his current symptom of diarrhea, he will discontinue his Amitiza low-dose, if colonoscopy is normal and continues to have diarrhea we may consider discontinuing the metformin component of his diabetes medicines. I do think in the grand scheme of things we need to take an approach of predominantly reassuring Derrick Carter and his mother during their visits rather than adding new medications.

## 2020-12-29 NOTE — Progress Notes (Signed)
    Procedures performed today:    None.  Independent interpretation of notes and tests performed by another provider:   None.  Brief History, Exam, Impression, and Recommendations:    Acute meniscal tear of left knee Has had an arthroscopy with Dr. Theophilus Bones, increasing pain, I have advised him to follow this up with his orthopedic surgeon.  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 I reassured them today that a high-fiber low FODMAP diet can be helpful, biofeedback can be helpful and that this will ultimately be a disease process that he will need to learn to live with. He did have a bit of transaminitis with an ALT in the 50s, rechecking that now. 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 Due to his current symptom of diarrhea, he will discontinue his Amitiza low-dose, if colonoscopy is normal and continues to have diarrhea we may consider discontinuing the metformin component of his diabetes medicines. I do think in the grand scheme of things we need to take an approach of predominantly reassuring Derrick Carter and his mother during their visits rather than adding new medications.  Diabetes mellitus, type 2 Rechecking routine labs. Is having some increasing diarrhea, I suspect this is related to his Amitiza which I have asked him to stop, and his mixed IBS. If colonoscopy is normal and continues to have diarrhea we may consider discontinuing the metformin component of his diabetes medicines. Adding urine microalbumin, he is due.    ___________________________________________ Derrick Carter. Derrick Carter, M.D., ABFM., CAQSM. Primary  Care and Sports Medicine [facility]  Adjunct Instructor of Family Medicine  [employer]

## 2020-12-29 NOTE — Assessment & Plan Note (Signed)
Has had an arthroscopy with Dr. Theophilus Bones, increasing pain, I have advised him to follow this up with his orthopedic surgeon.

## 2020-12-30 ENCOUNTER — Other Ambulatory Visit: Payer: Self-pay | Admitting: Sports Medicine

## 2020-12-31 LAB — MICROALBUMIN / CREATININE URINE RATIO
Creatinine, Urine: 99.3 mg/dL
Microalb/Creat Ratio: 14 mg/g creat (ref 0–29)
Microalbumin, Urine: 13.9 ug/mL

## 2021-01-01 LAB — CBC WITH DIFFERENTIAL/PLATELET
Basophils Absolute: 0 10*3/uL (ref 0.0–0.2)
Basos: 0 %
EOS (ABSOLUTE): 0.1 10*3/uL (ref 0.0–0.4)
Eos: 1 %
Hematocrit: 51.5 % — ABNORMAL HIGH (ref 37.5–51.0)
Hemoglobin: 17.1 g/dL (ref 13.0–17.7)
Immature Grans (Abs): 0 10*3/uL (ref 0.0–0.1)
Immature Granulocytes: 0 %
Lymphocytes Absolute: 2 10*3/uL (ref 0.7–3.1)
Lymphs: 19 %
MCH: 29.9 pg (ref 26.6–33.0)
MCHC: 33.2 g/dL (ref 31.5–35.7)
MCV: 90 fL (ref 79–97)
Monocytes Absolute: 0.8 10*3/uL (ref 0.1–0.9)
Monocytes: 7 %
Neutrophils Absolute: 7.6 10*3/uL — ABNORMAL HIGH (ref 1.4–7.0)
Neutrophils: 73 %
Platelets: 311 10*3/uL (ref 150–450)
RBC: 5.72 x10E6/uL (ref 4.14–5.80)
RDW: 12.4 % (ref 11.6–15.4)
WBC: 10.5 10*3/uL (ref 3.4–10.8)

## 2021-01-01 LAB — LIPID PANEL W/O CHOL/HDL RATIO
Cholesterol, Total: 120 mg/dL (ref 100–199)
HDL: 27 mg/dL — ABNORMAL LOW (ref >39)
LDL Chol Calc (NIH): 63 mg/dL (ref 0–99)
Triglycerides: 174 mg/dL — ABNORMAL HIGH (ref 0–149)
VLDL Cholesterol Cal: 30 mg/dL (ref 5–40)

## 2021-01-01 LAB — HEMOGLOBIN A1C
Estimated Average Glucose: 180 mg/dL
Hemoglobin A1c: 7.9 %Hb — ABNORMAL HIGH

## 2021-01-01 LAB — COMPREHENSIVE METABOLIC PANEL
ALT: 42 IU/L (ref 0–44)
AST: 24 IU/L (ref 0–40)
Albumin/Globulin Ratio: 2.1 (ref 1.2–2.2)
Albumin: 5 g/dL (ref 4.1–5.2)
Alkaline Phosphatase: 73 IU/L (ref 44–121)
BUN/Creatinine Ratio: 14 (ref 9–20)
BUN: 12 mg/dL (ref 6–20)
Bilirubin Total: 0.7 mg/dL (ref 0.0–1.2)
CO2: 23 mmol/L (ref 20–29)
Calcium: 9.5 mg/dL (ref 8.7–10.2)
Chloride: 103 mmol/L (ref 96–106)
Creatinine, Ser: 0.84 mg/dL (ref 0.76–1.27)
Globulin, Total: 2.4 g/dL (ref 1.5–4.5)
Glucose: 148 mg/dL — ABNORMAL HIGH (ref 65–99)
Potassium: 3.8 mmol/L (ref 3.5–5.2)
Sodium: 140 mmol/L (ref 134–144)
Total Protein: 7.4 g/dL (ref 6.0–8.5)
eGFR: 122 mL/min/{1.73_m2} (ref >59)

## 2021-01-01 LAB — TSH: TSH: 1.91 u[IU]/mL (ref 0.450–4.500)

## 2021-01-01 LAB — SPECIMEN STATUS REPORT

## 2021-01-19 ENCOUNTER — Other Ambulatory Visit: Payer: Self-pay | Admitting: Sports Medicine

## 2021-01-19 DIAGNOSIS — E119 Type 2 diabetes mellitus without complications: Secondary | ICD-10-CM

## 2021-01-21 ENCOUNTER — Ambulatory Visit: Payer: BC Managed Care – PPO | Admitting: Physician Assistant

## 2021-01-24 ENCOUNTER — Other Ambulatory Visit: Payer: Self-pay | Admitting: Sports Medicine

## 2021-01-24 DIAGNOSIS — F319 Bipolar disorder, unspecified: Secondary | ICD-10-CM

## 2021-01-24 MED ORDER — LATUDA 120 MG PO TABS: 120.0000 mg | ORAL_TABLET | Freq: Every day | ORAL | 3 refills | Status: AC

## 2021-01-24 NOTE — Assessment & Plan Note (Signed)
Bipolar 2, doing well on Seroquel, switched to Jordan. Continue 120 mg dosage.

## 2021-01-30 ENCOUNTER — Other Ambulatory Visit: Payer: Self-pay | Admitting: Sports Medicine

## 2021-01-30 DIAGNOSIS — E119 Type 2 diabetes mellitus without complications: Secondary | ICD-10-CM

## 2021-02-01 ENCOUNTER — Encounter: Payer: Self-pay | Admitting: Sports Medicine

## 2021-03-21 ENCOUNTER — Emergency Department (HOSPITAL_COMMUNITY)
Admission: EM | Admit: 2021-03-21 | Discharge: 2021-03-21 | Disposition: A | Discharge status: Home / Self Care | Payer: No Typology Code available for payment source | Attending: Emergency Medicine | Admitting: Emergency Medicine

## 2021-03-21 ENCOUNTER — Emergency Department (HOSPITAL_COMMUNITY): Payer: No Typology Code available for payment source

## 2021-03-21 ENCOUNTER — Encounter (HOSPITAL_COMMUNITY): Payer: Self-pay | Admitting: *Deleted

## 2021-03-21 ENCOUNTER — Other Ambulatory Visit: Payer: Self-pay

## 2021-03-21 DIAGNOSIS — I110 Hypertensive heart disease with heart failure: Secondary | ICD-10-CM | POA: Diagnosis not present

## 2021-03-21 DIAGNOSIS — Z7984 Long term (current) use of oral hypoglycemic drugs: Secondary | ICD-10-CM | POA: Diagnosis not present

## 2021-03-21 DIAGNOSIS — I5030 Unspecified diastolic (congestive) heart failure: Secondary | ICD-10-CM | POA: Insufficient documentation

## 2021-03-21 DIAGNOSIS — M25562 Pain in left knee: Secondary | ICD-10-CM | POA: Insufficient documentation

## 2021-03-21 DIAGNOSIS — Y990 Civilian activity done for income or pay: Secondary | ICD-10-CM | POA: Insufficient documentation

## 2021-03-21 DIAGNOSIS — Z7952 Long term (current) use of systemic steroids: Secondary | ICD-10-CM | POA: Insufficient documentation

## 2021-03-21 DIAGNOSIS — Z79899 Other long term (current) drug therapy: Secondary | ICD-10-CM | POA: Diagnosis not present

## 2021-03-21 DIAGNOSIS — E119 Type 2 diabetes mellitus without complications: Secondary | ICD-10-CM | POA: Insufficient documentation

## 2021-03-21 DIAGNOSIS — X58XXXA Exposure to other specified factors, initial encounter: Secondary | ICD-10-CM | POA: Diagnosis not present

## 2021-03-21 DIAGNOSIS — S8992XA Unspecified injury of left lower leg, initial encounter: Secondary | ICD-10-CM | POA: Insufficient documentation

## 2021-03-21 DIAGNOSIS — M25511 Pain in right shoulder: Secondary | ICD-10-CM | POA: Diagnosis not present

## 2021-03-21 NOTE — ED Triage Notes (Signed)
Worker's comp injury 03/15/21. Pain in left knee and right shoulder

## 2021-03-21 NOTE — ED Provider Notes (Signed)
[facility] EMERGENCY DEPARTMENT Provider Note   CSN: [id number] Arrival date & time: 03/21/21  1742     History Chief Complaint  Patient presents with   Leg Injury    Derrick Carter is a 30 y.o. male.  Patient presents with complaint of right shoulder pain and left knee pain.  He states he injured himself at work during a training exercise on the 13th.  He states he fell onto the mat and had sharp pain to the left side of his left knee and to the right shoulder.  Denies loss of consciousness or pain elsewhere no neck pain no back pain no other extremity pain reported.  Denies any other illnesses no fever no cough no vomiting no diarrhea.      Past Medical History:  Diagnosis Date   Acanthosis 08/24/2014   ADHD (attention deficit hyperactivity disorder)    Annual physical exam 04/28/2014   Attention deficit hyperactivity disorder (ADHD) 02/04/2008   Overview:  ADHD, Combined Type  ICD-10 cut over   Last Assessment & Plan:  Relevant Hx: Course: Daily Update: Today's Plan: restart Strattera   Callus of foot 08/07/2017   Chest pain 10/30/2017   Chronic myofascial pain 03/23/2017   Chronic pain disorder 03/23/2017   Dental crown present    Diabetes mellitus without complication (HCC)    Diabetes mellitus, type 2 (HCC) 04/29/2014   Hyperlipidemia    Hypertriglyceridemia 11/19/2012   Insomnia 03/06/2014   Low back pain 08/19/2015   Migraine headache 05/19/2013   Obesity (BMI 30-39.9) 03/28/2013   Patellofemoral syndrome, bilateral 08/07/2017   Pilonidal cyst 03/2013   Pilonidal disease s/p I&D 03/10/2013 11/19/2012   Rhinitis 08/07/2017   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   Thoracic spondylosis without myelopathy 03/23/2017    Patient Active Problem List   Diagnosis Date Noted   Ptosis, left eyelid 06/29/2020   Irritable bowel syndrome with mixed bowel habits 04/29/2019   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   Asthma 04/15/2018   Diastolic dysfunction 01/02/2018   SOB (shortness of breath)  on exertion 10/30/2017   Bipolar disorder (HCC) 09/04/2017   Acute meniscal tear of left knee 08/07/2017   Rhinitis 08/07/2017   Callus of foot 08/07/2017   Thoracic spondylosis without myelopathy 03/23/2017   Low back pain 08/19/2015   Acanthosis 08/24/2014   Diabetes mellitus, type 2 (HCC) 04/29/2014   Annual physical exam 04/28/2014   Migraine headache 05/19/2013   Obesity (BMI 30-39.9) 03/28/2013   Pilonidal disease s/p I&D 03/10/2013 11/19/2012   ADHD (attention deficit hyperactivity disorder) 11/19/2012   Hyperlipidemia 10/08/2012    Past Surgical History:  Procedure Laterality Date   LEFT HEART CATH AND CORONARY ANGIOGRAPHY N/A 12/24/2017   Procedure: LEFT HEART CATH AND CORONARY ANGIOGRAPHY;  Surgeon: Yvonne Kendall, MD;  [facility];  Service: Cardiovascular;  Laterality: N/A;   NO PAST SURGERIES     PILONIDAL CYST EXCISION N/A 03/10/2013   Procedure: CYST EXCISION PILONIDAL SIMPLE I and D ;  Surgeon: Robyne Askew, MD;  Location:  SURGERY CENTER;  Service: General;  Laterality: N/A;       Family History  Problem Relation Age of Onset   Hyperlipidemia Mother    Heart disease Mother    Diabetes Mother    Hypertension Mother    Diabetes Maternal Grandmother    Hyperlipidemia Maternal Grandmother    Hyperlipidemia Maternal Grandfather     Social History   Tobacco Use   Smoking status: Never  Smokeless tobacco: Never  Vaping Use   Vaping Use: Never used  Substance Use Topics   Alcohol use: No   Drug use: No    Home Medications Prior to Admission medications   Medication Sig Start Date End Date Taking? Authorizing Provider  albuterol (VENTOLIN HFA) 108 (90 Base) MCG/ACT inhaler Inhale 2 puffs into the lungs every 6 (six) hours as needed for wheezing or shortness of breath. 11/24/19   Coral Ceo, NP  atorvastatin (LIPITOR) 80 MG tablet TAKE 1 TABLET (80 MG TOTAL) BY MOUTH DAILY AT 6 PM. 11/08/20   Monica Becton, MD   fenofibrate 160 MG tablet Take 1 tablet (160 mg total) by mouth daily. 06/30/19   Monica Becton, MD  finasteride (PROPECIA) 1 MG tablet TAKE 1 TABLET BY MOUTH EVERY DAY 05/10/20   Monica Becton, MD  fluticasone [facility]) 50 MCG/ACT nasal spray One spray in each nostril twice a day, use left hand for right nostril, and right hand for left nostril. 09/18/19   Steffanie Dunn, DO  Fluticasone-Salmeterol (ADVAIR DISKUS) 250-50 MCG/DOSE AEPB Inhale 1 puff into the lungs in the morning and at bedtime. 08/11/20   Charlott Holler, MD  glipiZIDE (GLUCOTROL) 10 MG tablet TAKE 1 TABLET (10 MG TOTAL) BY MOUTH 2 (TWO) TIMES DAILY BEFORE A MEAL. 01/19/21   Monica Becton, MD  JANUVIA 100 MG tablet TAKE 1 TABLET BY MOUTH EVERY DAY 01/30/21   Monica Becton, MD  Lurasidone HCl (LATUDA) 120 MG TABS Take 1 tablet (120 mg total) by mouth daily with breakfast. 01/24/21   Monica Becton, MD  XIGDUO XR 04-999 MG TB24 TAKE 1 TABLET BY MOUTH EVERY DAY 06/21/20   Monica Becton, MD    Allergies    Patient has no known allergies.  Review of Systems   Review of Systems  Constitutional:  Negative for fever.  HENT:  Negative for ear pain and sore throat.   Eyes:  Negative for pain.  Respiratory:  Negative for cough.   Cardiovascular:  Negative for chest pain.  Gastrointestinal:  Negative for abdominal pain.  Genitourinary:  Negative for flank pain.  Musculoskeletal:  Negative for back pain.  Skin:  Negative for color change and rash.  Neurological:  Negative for syncope.  All other systems reviewed and are negative.  Physical Exam Updated Vital Signs BP 139/86 (BP Location: Right Arm)   Pulse (!) 121   Temp 97.9 F (36.6 C)   Resp 20   Ht 5\' 11"  (1.803 m)   Wt 102 kg   SpO2 97%   BMI 31.36 kg/m   Physical Exam Constitutional:      Appearance: He is well-developed.  HENT:     Head: Normocephalic.     Nose: Nose normal.  Eyes:     Extraocular Movements:  Extraocular movements intact.  Cardiovascular:     Rate and Rhythm: Normal rate.  Pulmonary:     Effort: Pulmonary effort is normal.  Musculoskeletal:     Comments: Mild tenderness to the right shoulder AC joint region.  Otherwise normal range of motion of the right shoulder.  Mild to moderate tenderness in the left knee lateral joint space.  No gross deformity noted range of motion is normal.  Patient has an antalgic gait.  No anterior posterior laxity no varus or valgus laxity noted.  Neurovascularly intact all extremities.  Compartments are soft.  Skin:    Coloration: Skin is not jaundiced.  Neurological:  Mental Status: He is alert. Mental status is at baseline.    ED Results / Procedures / Treatments   Labs (all labs ordered are listed, but only abnormal results are displayed) Labs Reviewed - No data to display  EKG None  Radiology DG Shoulder Right  Result Date: 03/21/2021 CLINICAL DATA:  Recent fall with right shoulder pain, initial encounter EXAM: RIGHT SHOULDER - 2+ VIEW COMPARISON:  08/24/2014 FINDINGS: There is no evidence of fracture or dislocation. There is no evidence of arthropathy or other focal bone abnormality. Soft tissues are unremarkable. IMPRESSION: No acute abnormality noted. Electronically Signed   By: Alcide Clever M.D.   On: 03/21/2021 19:11   DG Knee Complete 4 Views Left  Result Date: 03/21/2021 CLINICAL DATA:  Left knee pain after a fall. EXAM: LEFT KNEE - COMPLETE 4+ VIEW COMPARISON:  None. FINDINGS: No evidence of fracture, dislocation, or joint effusion. No evidence of arthropathy or other focal bone abnormality. Soft tissues are unremarkable. IMPRESSION: Negative. Electronically Signed   By: Burman Nieves M.D.   On: 03/21/2021 19:10    Procedures Procedures   Medications Ordered in ED Medications - No data to display  ED Course  I have reviewed the triage vital signs and the nursing notes.  Pertinent labs & imaging results that were  available during my care of the patient were reviewed by me and considered in my medical decision making (see chart for details).    MDM Rules/Calculators/A&P                           X-ray imaging unremarkable for any acute fracture dislocation or foreign body.  Patient placed in a splint brace of the left lower extremity and given crutches.  He is to follow-up with his orthopedist tomorrow which is appropriate.  Advised immediate return if he has worsening symptoms or any additional concerns.  Final Clinical Impression(s) / ED Diagnoses Final diagnoses:  Acute pain of left knee  Acute pain of right shoulder    Rx / DC Orders ED Discharge Orders     None        Cheryll Cockayne, MD 03/21/21 2025

## 2021-03-21 NOTE — Discharge Instructions (Addendum)
Follow-up with your surgeon tomorrow.  Return if you have worsening symptoms worsening pain or any additional concerns.

## 2021-03-21 NOTE — ED Notes (Signed)
ED Provider at bedside. 

## 2021-03-23 ENCOUNTER — Telehealth: Payer: Self-pay | Admitting: General Practice

## 2021-03-23 NOTE — Telephone Encounter (Signed)
Transition Care Management Unsuccessful Follow-up Telephone Call  Date of discharge and from where:  03/21/21 from [facility]  Attempts:  1st Attempt  Reason for unsuccessful TCM follow-up call:  Left voice message

## 2021-03-25 NOTE — Telephone Encounter (Signed)
Transition Care Management Unsuccessful Follow-up Telephone Call  Date of discharge and from where:  03/21/21 from [facility]  Attempts:  2nd Attempt  Reason for unsuccessful TCM follow-up call:  Left voice message

## 2021-03-28 NOTE — Telephone Encounter (Signed)
Transition Care Management Unsuccessful Follow-up Telephone Call  Date of discharge and from where:  03/21/21 from [facility]   Attempts:  3rd Attempt  Reason for unsuccessful TCM follow-up call:  Left voice message He does have OV scheduled for 03/30/21.

## 2021-03-30 ENCOUNTER — Ambulatory Visit: Payer: BC Managed Care – PPO | Admitting: Sports Medicine

## 2021-03-30 ENCOUNTER — Other Ambulatory Visit: Payer: Self-pay

## 2021-03-30 DIAGNOSIS — S83207D Unspecified tear of unspecified meniscus, current injury, left knee, subsequent encounter: Secondary | ICD-10-CM

## 2021-03-30 DIAGNOSIS — Z23 Encounter for immunization: Secondary | ICD-10-CM

## 2021-03-30 DIAGNOSIS — M25511 Pain in right shoulder: Secondary | ICD-10-CM | POA: Insufficient documentation

## 2021-03-30 DIAGNOSIS — Z0000 Encounter for general adult medical examination without abnormal findings: Secondary | ICD-10-CM

## 2021-03-30 DIAGNOSIS — F319 Bipolar disorder, unspecified: Secondary | ICD-10-CM | POA: Diagnosis not present

## 2021-03-30 DIAGNOSIS — G8929 Other chronic pain: Secondary | ICD-10-CM

## 2021-03-30 NOTE — Assessment & Plan Note (Signed)
Johnn returns, he is post arthroscopy with Dr. Theophilus Bones for lateral meniscal tear, he had a fall at work and was having some pain anterolateral knee, was seen by Dr. Theophilus Bones, knee was stable and unremarkable. Exam today is completely benign. He can do some home conditioning exercises, we will keep him on light duty and he can return to see me as needed. X-rays at an outside facility done for this injury were negative.

## 2021-03-30 NOTE — Assessment & Plan Note (Signed)
Bipolar 2 with positive MDQ, doing well on Latuda 120 daily. He is endorsing visual and auditory hallucinations, no suicidal or homicidal ideation, continue Latuda, probably time to get him in with psychiatry.

## 2021-03-30 NOTE — Assessment & Plan Note (Signed)
History of asthma, Prevnar 20 today.

## 2021-03-30 NOTE — Addendum Note (Signed)
Addended by: Carolin Coy on: 03/30/2021 03:30 PM   Modules accepted: Orders

## 2021-03-30 NOTE — Assessment & Plan Note (Signed)
Pain over deltoid after a fall, good motion, good strength, all impingement signs, labral signs, bicipital signs are negative, cuff conditioning given, return as needed for this.

## 2021-03-30 NOTE — Progress Notes (Signed)
    Procedures performed today:    None.  Independent interpretation of notes and tests performed by another provider:   None.  Brief History, Exam, Impression, and Recommendations:    Acute meniscal tear of left knee Myer returns, he is post arthroscopy with Dr. Theophilus Bones for lateral meniscal tear, he had a fall at work and was having some pain anterolateral knee, was seen by Dr. Theophilus Bones, knee was stable and unremarkable. Exam today is completely benign. He can do some home conditioning exercises, we will keep him on light duty and he can return to see me as needed. X-rays at an outside facility done for this injury were negative.  Right shoulder pain Pain over deltoid after a fall, good motion, good strength, all impingement signs, labral signs, bicipital signs are negative, cuff conditioning given, return as needed for this.  Bipolar disorder (HCC) Bipolar 2 with positive MDQ, doing well on Latuda 120 daily. He is endorsing visual and auditory hallucinations, no suicidal or homicidal ideation, continue Latuda, probably time to get him in with psychiatry.  Annual physical exam History of asthma, Prevnar 20 today.    ___________________________________________ Derrick Carter. Benjamin Stain, M.D., ABFM., CAQSM. Primary Care and Sports Medicine [facility]  Adjunct Instructor of Family Medicine  [employer]

## 2021-05-04 ENCOUNTER — Other Ambulatory Visit: Payer: Self-pay | Admitting: Sports Medicine

## 2021-05-04 DIAGNOSIS — F319 Bipolar disorder, unspecified: Secondary | ICD-10-CM

## 2021-05-04 DIAGNOSIS — E119 Type 2 diabetes mellitus without complications: Secondary | ICD-10-CM

## 2021-05-04 DIAGNOSIS — F39 Unspecified mood [affective] disorder: Secondary | ICD-10-CM

## 2021-05-04 DIAGNOSIS — F5105 Insomnia due to other mental disorder: Secondary | ICD-10-CM

## 2021-05-09 LAB — HM DIABETES EYE EXAM

## 2021-05-16 ENCOUNTER — Other Ambulatory Visit: Payer: Self-pay | Admitting: Sports Medicine

## 2021-05-16 DIAGNOSIS — L649 Androgenic alopecia, unspecified: Secondary | ICD-10-CM

## 2021-05-17 ENCOUNTER — Encounter: Payer: Self-pay | Admitting: Sports Medicine

## 2021-05-18 ENCOUNTER — Other Ambulatory Visit: Payer: Self-pay

## 2021-05-18 ENCOUNTER — Encounter: Payer: Self-pay | Admitting: Sports Medicine

## 2021-05-18 ENCOUNTER — Ambulatory Visit: Payer: BC Managed Care – PPO | Admitting: Sports Medicine

## 2021-05-18 DIAGNOSIS — H9202 Otalgia, left ear: Secondary | ICD-10-CM | POA: Diagnosis not present

## 2021-05-18 MED ORDER — CIPROFLOXACIN-DEXAMETHASONE 0.3-0.1 % OT SUSP
4.0000 [drp] | Freq: Two times a day (BID) | OTIC | 0 refills | Status: AC
Start: 2021-05-18 — End: 2021-05-25

## 2021-05-18 MED ORDER — NAPROXEN 500 MG PO TABS: 500.0000 mg | ORAL_TABLET | Freq: Two times a day (BID) | ORAL | 0 refills | Status: DC

## 2021-05-18 NOTE — Assessment & Plan Note (Addendum)
Left ear pain for a week, he does have a bit of wax by the tympanic membrane, so we will flush this out. He also has some discomfort with opening and closing his jaw worrisome for TMJ dysfunction, we will add naproxen twice a day, avoiding chewy foods, and we can revisit this in 2 to 4 weeks, if insufficient improvement we will consider referral to dentistry or OMFS.  Update: After irrigation of the wax I was able to see some erythema of the canal with swelling, suspect otitis externa, adding Ciprodex.

## 2021-05-18 NOTE — Progress Notes (Signed)
    Procedures performed today:    Indication: Cerumen impaction of the left ear(s) Medical necessity statement: On physical examination, cerumen impairs clinically significant portions of the external auditory canal, and tympanic membrane. Noted obstructive, copious cerumen that cannot be removed without magnification and instrumentations requiring physician skills Consent: Discussed benefits and risks of procedure and verbal consent obtained Procedure: Patient was prepped for the procedure. Utilized an otoscope to assess and take note of the ear canal, the tympanic membrane, and the presence, amount, and placement of the cerumen. Gentle water irrigation was utilized to remove cerumen.  Post procedure examination: shows cerumen was completely removed. Patient tolerated procedure well. The patient is made aware that they may experience temporary vertigo, temporary hearing loss, and temporary discomfort. If these symptom last for more than 24 hours to call the clinic or proceed to the ED.  Independent interpretation of notes and tests performed by another provider:   None.  Brief History, Exam, Impression, and Recommendations:    Left ear pain Left ear pain for a week, he does have a bit of wax by the tympanic membrane, so we will flush this out. He also has some discomfort with opening and closing his jaw worrisome for TMJ dysfunction, we will add naproxen twice a day, avoiding chewy foods, and we can revisit this in 2 to 4 weeks, if insufficient improvement we will consider referral to dentistry or OMFS.  Update: After irrigation of the wax I was able to see some erythema of the canal with swelling, suspect otitis externa, adding Ciprodex.    ___________________________________________ Ihor Austin. Benjamin Stain, M.D., ABFM., CAQSM. Primary Care and Sports Medicine [facility]  Adjunct Instructor of Family Medicine  [employer]

## 2021-05-23 ENCOUNTER — Other Ambulatory Visit: Payer: Self-pay | Admitting: Sports Medicine

## 2021-05-23 DIAGNOSIS — E119 Type 2 diabetes mellitus without complications: Secondary | ICD-10-CM

## 2021-06-06 ENCOUNTER — Other Ambulatory Visit: Payer: Self-pay

## 2021-06-06 ENCOUNTER — Ambulatory Visit (INDEPENDENT_AMBULATORY_CARE_PROVIDER_SITE_OTHER): Payer: BC Managed Care – PPO | Admitting: Sports Medicine

## 2021-06-06 VITALS — BP 130/88 | HR 70 | Wt 220.1 lb

## 2021-06-06 DIAGNOSIS — E119 Type 2 diabetes mellitus without complications: Secondary | ICD-10-CM | POA: Diagnosis not present

## 2021-06-06 DIAGNOSIS — R040 Epistaxis: Secondary | ICD-10-CM | POA: Insufficient documentation

## 2021-06-06 DIAGNOSIS — Z0000 Encounter for general adult medical examination without abnormal findings: Secondary | ICD-10-CM | POA: Diagnosis not present

## 2021-06-06 LAB — POCT GLYCOSYLATED HEMOGLOBIN (HGB A1C): Hemoglobin A1C: 7.8 % — AB (ref 4.0–5.6)

## 2021-06-06 NOTE — Assessment & Plan Note (Signed)
Annual physical as above, declines flu and COVID vaccinations. No further labs other than A1c needed for now. Return to see me in a year.

## 2021-06-06 NOTE — Assessment & Plan Note (Signed)
Occasional bleeding when blowing his nose, I did inform him this was due to the colder drier air, he can apply Vaseline, blow the nose less forcefully, avoid picking the nose, and when outside in the cold dry air he can wear a face covering that we will allow him to reinhale the more humidified exhaled air.

## 2021-06-06 NOTE — Progress Notes (Signed)
Subjective:    CC: Annual Physical Exam  HPI:  This patient is here for their annual physical  I reviewed the past medical history, family history, social history, surgical history, and allergies today and no changes were needed.  Please see the problem list section below in epic for further details.  Past Medical History: Past Medical History:  Diagnosis Date   Acanthosis 08/24/2014   ADHD (attention deficit hyperactivity disorder)    Annual physical exam 04/28/2014   Attention deficit hyperactivity disorder (ADHD) 02/04/2008   Overview:  ADHD, Combined Type  ICD-10 cut over   Last Assessment & Plan:  Relevant Hx: Course: Daily Update: Today's Plan: restart Strattera   Callus of foot 08/07/2017   Chest pain 10/30/2017   Chronic myofascial pain 03/23/2017   Chronic pain disorder 03/23/2017   Dental crown present    Diabetes mellitus without complication (HCC)    Diabetes mellitus, type 2 (HCC) 04/29/2014   Hyperlipidemia    Hypertriglyceridemia 11/19/2012   Insomnia 03/06/2014   Low back pain 08/19/2015   Migraine headache 05/19/2013   Obesity (BMI 30-39.9) 03/28/2013   Patellofemoral syndrome, bilateral 08/07/2017   Pilonidal cyst 03/2013   Pilonidal disease s/p I&D 03/10/2013 11/19/2012   Rhinitis 08/07/2017   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   Thoracic spondylosis without myelopathy 03/23/2017   Past Surgical History: Past Surgical History:  Procedure Laterality Date   LEFT HEART CATH AND CORONARY ANGIOGRAPHY N/A 12/24/2017   Procedure: LEFT HEART CATH AND CORONARY ANGIOGRAPHY;  Surgeon: Yvonne Kendall, MD;  Location: [facility];  Service: Cardiovascular;  Laterality: N/A;   NO PAST SURGERIES     PILONIDAL CYST EXCISION N/A 03/10/2013   Procedure: CYST EXCISION PILONIDAL SIMPLE I and D ;  Surgeon: Robyne Askew, MD;  Location: [facility];  Service: General;  Laterality: N/A;   Social History: Social History   Socioeconomic History   Marital status: Single     Spouse name: Not on file   Number of children: Not on file   Years of education: Not on file   Highest education level: Not on file  Occupational History   Not on file  Tobacco Use   Smoking status: Never   Smokeless tobacco: Never  Vaping Use   Vaping Use: Never used  Substance and Sexual Activity   Alcohol use: No   Drug use: No   Sexual activity: Never  Other Topics Concern   Not on file  Social History Narrative   Not on file   Social Determinants of Health   Financial Resource Strain: Not on file  Food Insecurity: Not on file  Transportation Needs: Not on file  Physical Activity: Not on file  Stress: Not on file  Social Connections: Not on file   Family History: Family History  Problem Relation Age of Onset   Hyperlipidemia Mother    Heart disease Mother    Diabetes Mother    Hypertension Mother    Diabetes Maternal Grandmother    Hyperlipidemia Maternal Grandmother    Hyperlipidemia Maternal Grandfather    Allergies: No Known Allergies Medications: See med rec.  Review of Systems: No headache, visual changes, nausea, vomiting, diarrhea, constipation, dizziness, abdominal pain, skin rash, fevers, chills, night sweats, swollen lymph nodes, weight loss, chest pain, body aches, joint swelling, muscle aches, shortness of breath, mood changes, visual or auditory hallucinations.  Objective:    General: Well Developed, well nourished, and in no acute distress.  Neuro: Alert  and oriented x3, extra-ocular muscles intact, sensation grossly intact. Cranial nerves II through XII are intact, motor, sensory, and coordinative functions are all intact. HEENT: Normocephalic, atraumatic, pupils equal round reactive to light, neck supple, no masses, no lymphadenopathy, thyroid nonpalpable. Oropharynx, nasopharynx, external ear canals are unremarkable. Skin: Warm and dry, no rashes noted.  Cardiac: Regular rate and rhythm, no murmurs rubs or gallops.  Respiratory: Clear to  auscultation bilaterally. Not using accessory muscles, speaking in full sentences.  Abdominal: Soft, nontender, nondistended, positive bowel sounds, no masses, no organomegaly.  Musculoskeletal: Shoulder, elbow, wrist, hip, knee, ankle stable, and with full range of motion.  Impression and Recommendations:    The patient was counselled, risk factors were discussed, anticipatory guidance given.  Annual physical exam Annual physical as above, declines flu and COVID vaccinations. No further labs other than A1c needed for now. Return to see me in a year.  Diabetes mellitus, type 2 A1c is controlled, foot exam was done today. We can revisit this next summer.  Epistaxis Occasional bleeding when blowing his nose, I did inform him this was due to the colder drier air, he can apply Vaseline, blow the nose less forcefully, avoid picking the nose, and when outside in the cold dry air he can wear a face covering that we will allow him to reinhale the more humidified exhaled air.   ___________________________________________ Ihor Austin. Benjamin Stain, M.D., ABFM., CAQSM. Primary Care and Sports Medicine [facility]  Adjunct Professor of Family Medicine  [employer]

## 2021-06-06 NOTE — Assessment & Plan Note (Signed)
A1c is controlled, foot exam was done today. We can revisit this next summer.

## 2021-06-15 ENCOUNTER — Other Ambulatory Visit: Payer: Self-pay | Admitting: Sports Medicine

## 2021-08-24 ENCOUNTER — Ambulatory Visit: Payer: BC Managed Care – PPO | Admitting: Sports Medicine

## 2021-09-13 ENCOUNTER — Ambulatory Visit: Payer: BC Managed Care – PPO | Admitting: Sports Medicine

## 2021-09-15 ENCOUNTER — Other Ambulatory Visit: Payer: Self-pay | Admitting: Sports Medicine

## 2021-10-28 ENCOUNTER — Other Ambulatory Visit: Payer: Self-pay | Admitting: Sports Medicine

## 2021-10-28 DIAGNOSIS — E782 Mixed hyperlipidemia: Secondary | ICD-10-CM

## 2021-10-28 DIAGNOSIS — E781 Pure hyperglyceridemia: Secondary | ICD-10-CM

## 2021-10-28 DIAGNOSIS — E119 Type 2 diabetes mellitus without complications: Secondary | ICD-10-CM

## 2021-12-13 ENCOUNTER — Other Ambulatory Visit: Payer: Self-pay | Admitting: Sports Medicine

## 2021-12-13 DIAGNOSIS — H9202 Otalgia, left ear: Secondary | ICD-10-CM

## 2021-12-20 ENCOUNTER — Ambulatory Visit (INDEPENDENT_AMBULATORY_CARE_PROVIDER_SITE_OTHER): Payer: BC Managed Care – PPO

## 2021-12-20 ENCOUNTER — Ambulatory Visit: Payer: BC Managed Care – PPO | Admitting: Sports Medicine

## 2021-12-20 DIAGNOSIS — S83207D Unspecified tear of unspecified meniscus, current injury, left knee, subsequent encounter: Secondary | ICD-10-CM

## 2021-12-20 NOTE — Assessment & Plan Note (Signed)
Derrick Carter returns, he is a pleasant 31 year old male, we initially saw him last year with a acute meniscal tear, he did have an arthroscopy with Dr. Theophilus Bones. Did well, but unfortunately started to have recurrence of pain, medial joint line. No mechanical symptoms, he does have pain with terminal flexion. Only minimal swelling. He has tried some oral NSAIDs without much improvement, today we injected his knee, I would like to see him back in about 6 weeks and we can do an MRI if not significantly better.

## 2021-12-20 NOTE — Progress Notes (Signed)
    Procedures performed today:    Procedure: Real-time Ultrasound Guided injection of the left knee Device: Samsung HS60  Verbal informed consent obtained.  Time-out conducted.  Noted no overlying erythema, induration, or other signs of local infection.  Skin prepped in a sterile fashion.  Local anesthesia: Topical Ethyl chloride.  With sterile technique and under real time ultrasound guidance: Trace effusion noted, 1 cc Kenalog 40, 2 cc lidocaine, 2 cc bupivacaine injected easily Completed without difficulty  Advised to call if fevers/chills, erythema, induration, drainage, or persistent bleeding.  Images permanently stored and available for review in PACS.  Impression: Technically successful ultrasound guided injection.  Independent interpretation of notes and tests performed by another provider:   None.  Brief History, Exam, Impression, and Recommendations:    Acute meniscal tear of left knee Derrick Carter returns, he is a pleasant 31 year old male, we initially saw him last year with a acute meniscal tear, he did have an arthroscopy with Dr. Theophilus Bones. Did well, but unfortunately started to have recurrence of pain, medial joint line. No mechanical symptoms, he does have pain with terminal flexion. Only minimal swelling. He has tried some oral NSAIDs without much improvement, today we injected his knee, I would like to see him back in about 6 weeks and we can do an MRI if not significantly better.    ___________________________________________ Derrick Carter. Benjamin Stain, M.D., ABFM., CAQSM. Primary Care and Sports Medicine [facility]  Adjunct Instructor of Family Medicine  [employer]

## 2021-12-22 ENCOUNTER — Telehealth: Payer: Self-pay

## 2021-12-22 MED ORDER — TRAMADOL HCL 50 MG PO TABS: 50.0000 mg | ORAL_TABLET | Freq: Three times a day (TID) | ORAL | 0 refills | Status: DC | PRN

## 2021-12-22 NOTE — Telephone Encounter (Signed)
Patient called and stated that you were supposed to call in tramadol for him.

## 2021-12-22 NOTE — Telephone Encounter (Signed)
Left Vm that prescription was sent to pharmacy.

## 2021-12-22 NOTE — Telephone Encounter (Signed)
Sending in tramadol

## 2022-01-07 ENCOUNTER — Other Ambulatory Visit: Payer: Self-pay | Admitting: Sports Medicine

## 2022-01-07 DIAGNOSIS — E119 Type 2 diabetes mellitus without complications: Secondary | ICD-10-CM

## 2022-01-16 ENCOUNTER — Other Ambulatory Visit: Payer: Self-pay | Admitting: Sports Medicine

## 2022-01-22 ENCOUNTER — Other Ambulatory Visit: Payer: Self-pay | Admitting: Sports Medicine

## 2022-01-22 DIAGNOSIS — E119 Type 2 diabetes mellitus without complications: Secondary | ICD-10-CM

## 2022-01-22 DIAGNOSIS — E781 Pure hyperglyceridemia: Secondary | ICD-10-CM

## 2022-01-22 DIAGNOSIS — E782 Mixed hyperlipidemia: Secondary | ICD-10-CM

## 2022-01-25 ENCOUNTER — Telehealth: Payer: Self-pay

## 2022-01-25 NOTE — Telephone Encounter (Addendum)
Initiated Prior authorization HWE:XHBZJIRC HCl 50MG  tablets Via: Covermymeds Case/Key:[id number]  Status: approved  as of 01/25/22 Reason:this request is approved for the following time period: 01/25/2022 - 07/28/2022 Notified Pt via: Mychart

## 2022-02-23 ENCOUNTER — Other Ambulatory Visit: Payer: Self-pay | Admitting: Sports Medicine

## 2022-03-21 ENCOUNTER — Other Ambulatory Visit: Payer: Self-pay | Admitting: Sports Medicine

## 2022-04-24 ENCOUNTER — Other Ambulatory Visit: Payer: Self-pay | Admitting: Sports Medicine

## 2022-04-24 DIAGNOSIS — E119 Type 2 diabetes mellitus without complications: Secondary | ICD-10-CM

## 2022-04-24 DIAGNOSIS — E781 Pure hyperglyceridemia: Secondary | ICD-10-CM

## 2022-04-24 DIAGNOSIS — E782 Mixed hyperlipidemia: Secondary | ICD-10-CM

## 2022-05-08 ENCOUNTER — Other Ambulatory Visit: Payer: Self-pay | Admitting: Sports Medicine

## 2022-05-08 DIAGNOSIS — E119 Type 2 diabetes mellitus without complications: Secondary | ICD-10-CM

## 2022-05-17 LAB — HM DIABETES EYE EXAM

## 2022-06-06 ENCOUNTER — Ambulatory Visit (INDEPENDENT_AMBULATORY_CARE_PROVIDER_SITE_OTHER): Payer: BC Managed Care – PPO

## 2022-06-06 ENCOUNTER — Ambulatory Visit: Payer: BC Managed Care – PPO | Admitting: Sports Medicine

## 2022-06-06 DIAGNOSIS — S83207D Unspecified tear of unspecified meniscus, current injury, left knee, subsequent encounter: Secondary | ICD-10-CM

## 2022-06-06 MED ORDER — TRAMADOL HCL 50 MG PO TABS: 50.0000 mg | ORAL_TABLET | Freq: Two times a day (BID) | ORAL | 0 refills | Status: DC

## 2022-06-06 MED ORDER — TRIAMCINOLONE ACETONIDE 40 MG/ML IJ SUSP
40.0000 mg | Freq: Once | INTRAMUSCULAR | Status: AC
  Administered 2022-06-06: 40 mg via INTRAMUSCULAR

## 2022-06-06 NOTE — Assessment & Plan Note (Addendum)
This is a very pleasant 31 year old male, chronic left knee pain, he did have an MRI, acute meniscal tear with an arthroscopic debridement with Dr. Theophilus Bones. He did well initially but unfortunately started to have recurrence of pain medial joint line with swelling, we did a knee injection back in June, and he did really well for about 6 months. Unfortunately having recurrence of pain, swelling. 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 Refilling tramadol. Out of work for 2 days. Return to see me to go over MRI results.

## 2022-06-06 NOTE — Progress Notes (Signed)
    Procedures performed today:    Procedure: Real-time Ultrasound Guided injection of the left knee Device: Samsung HS60  Verbal informed consent obtained.  Time-out conducted.  Noted no overlying erythema, induration, or other signs of local infection.  Skin prepped in a sterile fashion.  Local anesthesia: Topical Ethyl chloride.  With sterile technique and under real time ultrasound guidance: Only trace effusion noted, 1 cc Kenalog 40, 2 cc lidocaine, 2 cc bupivacaine injected easily Completed without difficulty  Advised to call if fevers/chills, erythema, induration, drainage, or persistent bleeding.  Images permanently stored and available for review in PACS.  Impression: Technically successful ultrasound guided injection.  Independent interpretation of notes and tests performed by another provider:   None.  Brief History, Exam, Impression, and Recommendations:    Acute meniscal tear of left knee This is a very pleasant 31 year old male, chronic left knee pain, he did have an MRI, acute meniscal tear with an arthroscopic debridement with Dr. Theophilus Bones. He did well initially but unfortunately started to have recurrence of pain medial joint line with swelling, we did a knee injection back in June, and he did really well for about 6 months. Unfortunately having recurrence of pain, swelling. 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 Refilling tramadol. Out of work for 2 days. Return to see me to go over MRI results.  Chronic process with exacerbation and pharmacologic invention  ____________________________________________ Ihor Austin. Benjamin Stain, M.D., ABFM., CAQSM., AME. Primary Care and Sports Medicine  [facility]  Adjunct Professor of Family Medicine  [employer]  Restaurant manager, fast food

## 2022-06-07 ENCOUNTER — Encounter: Payer: Self-pay | Admitting: Sports Medicine

## 2022-06-11 ENCOUNTER — Ambulatory Visit (INDEPENDENT_AMBULATORY_CARE_PROVIDER_SITE_OTHER): Payer: BC Managed Care – PPO

## 2022-06-11 DIAGNOSIS — S83207A Unspecified tear of unspecified meniscus, current injury, left knee, initial encounter: Secondary | ICD-10-CM

## 2022-06-11 DIAGNOSIS — S83207D Unspecified tear of unspecified meniscus, current injury, left knee, subsequent encounter: Secondary | ICD-10-CM

## 2022-06-13 ENCOUNTER — Other Ambulatory Visit: Payer: Self-pay | Admitting: Sports Medicine

## 2022-06-19 ENCOUNTER — Other Ambulatory Visit: Payer: Self-pay | Admitting: Sports Medicine

## 2022-06-19 MED ORDER — XIGDUO XR 10-1000 MG PO TB24: 1.0000 | ORAL_TABLET | Freq: Every day | ORAL | 3 refills | Status: DC

## 2022-07-10 ENCOUNTER — Other Ambulatory Visit: Payer: Self-pay | Admitting: Sports Medicine

## 2022-07-25 ENCOUNTER — Other Ambulatory Visit: Payer: Self-pay | Admitting: Sports Medicine

## 2022-07-25 DIAGNOSIS — E119 Type 2 diabetes mellitus without complications: Secondary | ICD-10-CM

## 2022-07-25 DIAGNOSIS — E782 Mixed hyperlipidemia: Secondary | ICD-10-CM

## 2022-07-25 DIAGNOSIS — E781 Pure hyperglyceridemia: Secondary | ICD-10-CM

## 2022-08-13 ENCOUNTER — Other Ambulatory Visit: Payer: Self-pay | Admitting: Sports Medicine

## 2022-08-27 ENCOUNTER — Other Ambulatory Visit: Payer: Self-pay | Admitting: Sports Medicine

## 2022-08-27 DIAGNOSIS — E119 Type 2 diabetes mellitus without complications: Secondary | ICD-10-CM

## 2022-09-12 ENCOUNTER — Other Ambulatory Visit: Payer: Self-pay | Admitting: Sports Medicine

## 2022-10-19 ENCOUNTER — Other Ambulatory Visit: Payer: Self-pay | Admitting: Sports Medicine

## 2022-10-19 DIAGNOSIS — E782 Mixed hyperlipidemia: Secondary | ICD-10-CM

## 2022-10-19 DIAGNOSIS — E119 Type 2 diabetes mellitus without complications: Secondary | ICD-10-CM

## 2022-10-19 DIAGNOSIS — E781 Pure hyperglyceridemia: Secondary | ICD-10-CM

## 2022-10-24 ENCOUNTER — Other Ambulatory Visit: Payer: Self-pay | Admitting: Sports Medicine

## 2022-11-16 ENCOUNTER — Ambulatory Visit: Payer: BC Managed Care – PPO | Admitting: Sports Medicine

## 2022-11-28 ENCOUNTER — Other Ambulatory Visit: Payer: Self-pay | Admitting: Sports Medicine

## 2022-12-14 ENCOUNTER — Other Ambulatory Visit (INDEPENDENT_AMBULATORY_CARE_PROVIDER_SITE_OTHER): Payer: BC Managed Care – PPO

## 2022-12-14 ENCOUNTER — Ambulatory Visit: Payer: BC Managed Care – PPO | Admitting: Sports Medicine

## 2022-12-14 DIAGNOSIS — S83207D Unspecified tear of unspecified meniscus, current injury, left knee, subsequent encounter: Secondary | ICD-10-CM | POA: Diagnosis not present

## 2022-12-14 NOTE — Progress Notes (Signed)
    Procedures performed today:    Procedure: Real-time Ultrasound Guided injection of the left knee Device: Samsung HS60  Verbal informed consent obtained.  Time-out conducted.  Noted no overlying erythema, induration, or other signs of local infection.  Skin prepped in a sterile fashion.  Local anesthesia: Topical Ethyl chloride.  With sterile technique and under real time ultrasound guidance: Mild effusion noted, 1 cc Kenalog 40, 2 cc lidocaine, 2 cc bupivacaine injected easily Completed without difficulty  Advised to call if fevers/chills, erythema, induration, drainage, or persistent bleeding.  Images permanently stored and available for review in PACS.  Impression: Technically successful ultrasound guided injection.  Independent interpretation of notes and tests performed by another provider:   None.  Brief History, Exam, Impression, and Recommendations:    Acute meniscal tear of left knee Pleasant 32 year old male, chronic left knee pain, he had an MRI with an acute meniscal tear, had arthroscopic debridement, unfortunately had a recurrence of pain at the joint line, he had a knee injection in June 2023 and did well for over 6 months, we also injected his knee in December 2023, now having recurrence of pain, reinjected the knee today.    ____________________________________________ Ihor Austin. Benjamin Stain, M.D., ABFM., CAQSM., AME. Primary Care and Sports Medicine [place] [facility]  Adjunct Professor of Family Medicine  [employer] [facility]  Restaurant manager, fast food

## 2022-12-14 NOTE — Assessment & Plan Note (Signed)
Pleasant 32 year old male, chronic left knee pain, he had an MRI with an acute meniscal tear, had arthroscopic debridement, unfortunately had a recurrence of pain at the joint line, he had a knee injection in June 2023 and did well for over 6 months, we also injected his knee in December 2023, now having recurrence of pain, reinjected the knee today.

## 2022-12-18 ENCOUNTER — Telehealth: Payer: Self-pay | Admitting: Sports Medicine

## 2022-12-18 ENCOUNTER — Encounter: Payer: Self-pay | Admitting: Sports Medicine

## 2022-12-18 NOTE — Telephone Encounter (Signed)
It is probably not from the shot but may be more likely to failure of the shot to work for the underlying disease which is causing the swelling, if still having symptoms he could see me tomorrow first thing in the morning at 830, there are no other openings today here in any of our schedules.

## 2022-12-18 NOTE — Telephone Encounter (Signed)
Patient's mom called says th patient's leg is swollen and hurts from shot he had on this past Friday wants to know if he can be seen this afternoon at 4pm please adivise

## 2022-12-19 ENCOUNTER — Other Ambulatory Visit: Payer: Self-pay | Admitting: Sports Medicine

## 2022-12-19 DIAGNOSIS — E119 Type 2 diabetes mellitus without complications: Secondary | ICD-10-CM

## 2022-12-20 ENCOUNTER — Telehealth: Payer: Self-pay | Admitting: General Practice

## 2022-12-20 NOTE — Transitions of Care (Post Inpatient/ED Visit) (Signed)
   12/20/2022  Name: CHARLESON MIKULICH MRN: [id number] DOB: 1990-08-12  Today's TOC FU Call Status: Today's TOC FU Call Status:: Unsuccessul Call (1st Attempt) Unsuccessful Call (1st Attempt) Date: 12/20/22  Attempted to reach the patient regarding the most recent Inpatient/ED visit.  Follow Up Plan: Additional outreach attempts will be made to reach the patient to complete the Transitions of Care (Post Inpatient/ED visit) call.   Signature Modesto Charon, Control and instrumentation engineer

## 2022-12-25 ENCOUNTER — Other Ambulatory Visit: Payer: Self-pay | Admitting: Sports Medicine

## 2022-12-25 NOTE — Transitions of Care (Post Inpatient/ED Visit) (Signed)
   12/25/2022  Name: Derrick Carter MRN: [id number] DOB: 08-24-1990  Today's TOC FU Call Status: Today's TOC FU Call Status:: Unsuccessful Call (2nd Attempt) Unsuccessful Call (1st Attempt) Date: 12/20/22 Unsuccessful Call (2nd Attempt) Date: 12/25/22  Attempted to reach the patient regarding the most recent Inpatient/ED visit.  Follow Up Plan: Additional outreach attempts will be made to reach the patient to complete the Transitions of Care (Post Inpatient/ED visit) call.   Signature Modesto Charon, Control and instrumentation engineer

## 2022-12-26 NOTE — Transitions of Care (Post Inpatient/ED Visit) (Signed)
   12/26/2022  Name: Derrick Carter MRN: [id number] DOB: 1990-11-21  Today's TOC FU Call Status: Today's TOC FU Call Status:: Unsuccessful Call (3rd Attempt) Unsuccessful Call (1st Attempt) Date: 12/20/22 Unsuccessful Call (2nd Attempt) Date: 12/25/22 Unsuccessful Call (3rd Attempt) Date: 12/26/22  Attempted to reach the patient regarding the most recent Inpatient/ED visit.  Follow Up Plan: No further outreach attempts will be made at this time. We have been unable to contact the patient.  Signature Modesto Charon, Control and instrumentation engineer

## 2023-01-11 ENCOUNTER — Ambulatory Visit: Payer: BC Managed Care – PPO | Admitting: Sports Medicine

## 2023-01-11 DIAGNOSIS — M23307 Other meniscus derangements, unspecified meniscus, left knee: Secondary | ICD-10-CM

## 2023-01-11 MED ORDER — TRAMADOL HCL 50 MG PO TABS: 50.0000 mg | ORAL_TABLET | Freq: Three times a day (TID) | ORAL | 0 refills | Status: DC | PRN

## 2023-01-11 NOTE — Assessment & Plan Note (Signed)
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 Theophilus Bones in 2019, more recently December 2023 we obtained a new MRI after failure of conservative treatment, it did show some cartilage loss as well as persistent meniscal tearing. We did a steroid injection and he had no relief, not even temporary. At this point I think he needs to touch base again with Dr. Theophilus Bones for discussion of repeat knee arthroscopy, in the meantime we will get working on approving him for viscosupplementation. I am going to increase his tramadol to 3 times daily just for the next month. He understands we are not going to go to schedule II narcotics at this juncture.

## 2023-01-11 NOTE — Progress Notes (Signed)
    Procedures performed today:    None.  Independent interpretation of notes and tests performed by another provider:   None.  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 Theophilus Bones in 2019, more recently December 2023 we obtained a new MRI after failure of conservative treatment, it did show some cartilage loss as well as persistent meniscal tearing. We did a steroid injection and he had no relief, not even temporary. At this point I think he needs to touch base again with Dr. Theophilus Bones for discussion of repeat knee arthroscopy, in the meantime we will get working on approving him for viscosupplementation. I am going to increase his tramadol to 3 times daily just for the next month. He understands we are not going to go to schedule II narcotics at this juncture.    ____________________________________________ Ihor Austin. Benjamin Stain, M.D., ABFM., CAQSM., AME. Primary Care and Sports Medicine [place] [facility]  Adjunct Professor of Family Medicine  [employer] [facility] [employer]  Restaurant manager, fast food

## 2023-01-13 ENCOUNTER — Other Ambulatory Visit: Payer: Self-pay | Admitting: Sports Medicine

## 2023-01-13 DIAGNOSIS — E781 Pure hyperglyceridemia: Secondary | ICD-10-CM

## 2023-01-13 DIAGNOSIS — E119 Type 2 diabetes mellitus without complications: Secondary | ICD-10-CM

## 2023-01-13 DIAGNOSIS — E782 Mixed hyperlipidemia: Secondary | ICD-10-CM

## 2023-02-26 ENCOUNTER — Other Ambulatory Visit: Payer: Self-pay | Admitting: Sports Medicine

## 2023-04-02 ENCOUNTER — Other Ambulatory Visit: Payer: Self-pay | Admitting: Sports Medicine

## 2023-04-10 ENCOUNTER — Other Ambulatory Visit: Payer: Self-pay | Admitting: Sports Medicine

## 2023-04-10 DIAGNOSIS — E119 Type 2 diabetes mellitus without complications: Secondary | ICD-10-CM

## 2023-04-12 ENCOUNTER — Other Ambulatory Visit: Payer: Self-pay | Admitting: Sports Medicine

## 2023-04-12 DIAGNOSIS — E119 Type 2 diabetes mellitus without complications: Secondary | ICD-10-CM

## 2023-04-12 DIAGNOSIS — E781 Pure hyperglyceridemia: Secondary | ICD-10-CM

## 2023-04-12 DIAGNOSIS — E782 Mixed hyperlipidemia: Secondary | ICD-10-CM

## 2023-05-02 ENCOUNTER — Other Ambulatory Visit: Payer: Self-pay | Admitting: Sports Medicine

## 2023-05-29 LAB — HM DIABETES EYE EXAM

## 2023-05-30 ENCOUNTER — Encounter: Payer: Self-pay | Admitting: Sports Medicine

## 2023-06-04 ENCOUNTER — Other Ambulatory Visit: Payer: Self-pay | Admitting: Sports Medicine

## 2023-06-08 ENCOUNTER — Ambulatory Visit (INDEPENDENT_AMBULATORY_CARE_PROVIDER_SITE_OTHER): Payer: BC Managed Care – PPO | Admitting: Sports Medicine

## 2023-06-08 ENCOUNTER — Encounter: Payer: Self-pay | Admitting: Sports Medicine

## 2023-06-08 VITALS — BP 132/86 | HR 90 | Ht 71.0 in | Wt 195.0 lb

## 2023-06-08 DIAGNOSIS — Z0000 Encounter for general adult medical examination without abnormal findings: Secondary | ICD-10-CM

## 2023-06-08 DIAGNOSIS — Z7984 Long term (current) use of oral hypoglycemic drugs: Secondary | ICD-10-CM

## 2023-06-08 DIAGNOSIS — Z113 Encounter for screening for infections with a predominantly sexual mode of transmission: Secondary | ICD-10-CM

## 2023-06-08 DIAGNOSIS — E119 Type 2 diabetes mellitus without complications: Secondary | ICD-10-CM

## 2023-06-08 NOTE — Assessment & Plan Note (Signed)
Annual physical as above, declines flu shot. Checking routine labs including diabetic testing, urine, he also desires STD screening. Return to see me in 6 months for an A1c check.

## 2023-06-08 NOTE — Assessment & Plan Note (Signed)
Works in a [employer], needs STD screening

## 2023-06-08 NOTE — Progress Notes (Signed)
Subjective:    CC: Annual Physical Exam  HPI:  This patient is here for their annual physical  I reviewed the past medical history, family history, social history, surgical history, and allergies today and no changes were needed.  Please see the problem list section below in epic for further details.  Past Medical History: Past Medical History:  Diagnosis Date   Acanthosis 08/24/2014   ADHD (attention deficit hyperactivity disorder)    Annual physical exam 04/28/2014   Attention deficit hyperactivity disorder (ADHD) 02/04/2008   Overview:  ADHD, Combined Type  ICD-10 cut over   Last Assessment & Plan:  Relevant Hx: Course: Daily Update: Today's Plan: restart Str[place]a   Callus of foot 08/07/2017   Chest pain 10/30/2017   Chronic myofascial pain 03/23/2017   Chronic pain disorder 03/23/2017   Dental crown present    Diabetes mellitus without complication (HCC)    Diabetes mellitus, type 2 (HCC) 04/29/2014   Hyperlipidemia    Hypertriglyceridemia 11/19/2012   Insomnia 03/06/2014   Low back pain 08/19/2015   Migraine headache 05/19/2013   Obesity (BMI 30-39.9) 03/28/2013   Patellofemoral syndrome, bilateral 08/07/2017   Pilonidal cyst 03/2013   Pilonidal disease s/p I&D 03/10/2013 11/19/2012   Rhinitis 08/07/2017   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   Thoracic spondylosis without myelopathy 03/23/2017   Past Surgical History: Past Surgical History:  Procedure Laterality Date   KNEE ARTHROSCOPY W/ MENISCAL REPAIR Left 06/11/2018   LEFT HEART CATH AND CORONARY ANGIOGRAPHY N/A 12/24/2017   Procedure: LEFT HEART CATH AND CORONARY ANGIOGRAPHY;  Surgeon: Yvonne Kendall, MD;  Location: [facility];  Service: Cardiovascular;  Laterality: N/A;   NO PAST SURGERIES     PILONIDAL CYST EXCISION N/A 03/10/2013   Procedure: CYST EXCISION PILONIDAL SIMPLE I and D ;  Surgeon: Robyne Askew, MD;  Location: [facility];  Service: General;  Laterality: N/A;   Social History: Social  History   Socioeconomic History   Marital status: Single    Spouse name: Not on file   Number of children: Not on file   Years of education: Not on file   Highest education level: Not on file  Occupational History   Not on file  Tobacco Use   Smoking status: Never   Smokeless tobacco: Never  Vaping Use   Vaping status: Never Used  Substance and Sexual Activity   Alcohol use: No   Drug use: No   Sexual activity: Never  Other Topics Concern   Not on file  Social History Narrative   Not on file   Social Determinants of Health   Financial Resource Strain: Not on file  Food Insecurity: No Food Insecurity (07/28/2021)   [facility] from [facility]   Hunger Vital Sign    Worried About Running Out of Food in the Last Year: Never true    Ran Out of Food in the Last Year: Never true  Transportation Needs: Not on file  Physical Activity: Not on file  Stress: Not on file  Social Connections: Unknown (11/01/2021)   [facility], [facility]   Social Network    Social Network: Not on file   Family History: Family History  Problem Relation Age of Onset   Hyperlipidemia Mother    Heart disease Mother    Diabetes Mother    Hypertension Mother    Diabetes Maternal Grandmother    Hyperlipidemia Maternal Grandmother    Hyperlipidemia Maternal Grandfather  Allergies: No Known Allergies Medications: See med rec.  Review of Systems: No headache, visual changes, nausea, vomiting, diarrhea, constipation, dizziness, abdominal pain, skin rash, fevers, chills, night sweats, swollen lymph nodes, weight loss, chest pain, body aches, joint swelling, muscle aches, shortness of breath, mood changes, visual or auditory hallucinations.  Objective:    General: Well Developed, well nourished, and in no acute distress.  Neuro: Alert and oriented x3, extra-ocular muscles intact, sensation grossly intact. Cranial nerves II through XII are intact, motor, sensory, and  coordinative functions are all intact. HEENT: Normocephalic, atraumatic, pupils equal round reactive to light, neck supple, no masses, no lymphadenopathy, thyroid nonpalpable. Oropharynx, nasopharynx, external ear canals are unremarkable. Skin: Warm and dry, no rashes noted.  Cardiac: Regular rate and rhythm, no murmurs rubs or gallops.  Respiratory: Clear to auscultation bilaterally. Not using accessory muscles, speaking in full sentences.  Abdominal: Soft, nontender, nondistended, positive bowel sounds, no masses, no organomegaly.  Musculoskeletal: Shoulder, elbow, wrist, hip, knee, ankle stable, and with full range of motion.  Impression and Recommendations:    The patient was counselled, risk factors were discussed, anticipatory guidance given.  Annual physical exam Annual physical as above, declines flu shot. Checking routine labs including diabetic testing, urine, he also desires STD screening. Return to see me in 6 months for an A1c check.  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 Benjamin Stain, M.D., ABFM., CAQSM., AME. Primary Care and Sports Medicine [place] [facility]  Adjunct Professor of Family Medicine  [employer] [facility] [employer] [employer] [facility] [facility] [employer]  Restaurant manager, fast food

## 2023-06-10 LAB — TSH: TSH: 1.42 u[IU]/mL (ref 0.450–4.500)

## 2023-06-10 LAB — COMPREHENSIVE METABOLIC PANEL WITH GFR
ALT: 28 IU/L (ref 0–44)
AST: 17 IU/L (ref 0–40)
Albumin: 4.5 g/dL (ref 4.1–5.1)
Alkaline Phosphatase: 82 IU/L (ref 44–121)
Bilirubin Total: 1 mg/dL (ref 0.0–1.2)
Calcium: 9.3 mg/dL (ref 8.7–10.2)
Creatinine, Ser: 0.83 mg/dL (ref 0.76–1.27)
Globulin, Total: 2.6 g/dL (ref 1.5–4.5)
eGFR: 120 mL/min/1.73 (ref >59)

## 2023-06-10 LAB — CBC
Hematocrit: 49.2 % (ref 37.5–51.0)
Hemoglobin: 16 g/dL (ref 13.0–17.7)
MCH: 30.5 pg (ref 26.6–33.0)
MCHC: 32.5 g/dL (ref 31.5–35.7)
MCV: 94 fL (ref 79–97)
Platelets: 307 x10E3/uL (ref 150–450)
RBC: 5.25 x10E6/uL (ref 4.14–5.80)
RDW: 12.6 % (ref 11.6–15.4)
WBC: 12.6 10*3/uL — ABNORMAL HIGH (ref 3.4–10.8)

## 2023-06-10 LAB — COMPREHENSIVE METABOLIC PANEL
BUN/Creatinine Ratio: 14 (ref 9–20)
BUN: 12 mg/dL (ref 6–20)
CO2: 25 mmol/L (ref 20–29)
Chloride: 103 mmol/L (ref 96–106)
Glucose: 164 mg/dL — ABNORMAL HIGH (ref 70–99)
Potassium: 3.9 mmol/L (ref 3.5–5.2)
Sodium: 144 mmol/L (ref 134–144)
Total Protein: 7.1 g/dL (ref 6.0–8.5)

## 2023-06-10 LAB — LIPID PANEL
Chol/HDL Ratio: 3.9 ratio (ref 0.0–5.0)
Cholesterol, Total: 109 mg/dL (ref 100–199)
HDL: 28 mg/dL — ABNORMAL LOW (ref >39)
LDL Chol Calc (NIH): 54 mg/dL (ref 0–99)
Triglycerides: 159 mg/dL — ABNORMAL HIGH (ref 0–149)
VLDL Cholesterol Cal: 27 mg/dL (ref 5–40)

## 2023-06-10 LAB — RPR+HIV+GC+CT PANEL
Chlamydia trachomatis, NAA: NEGATIVE
HIV Screen 4th Generation wRfx: NONREACTIVE
Neisseria Gonorrhoeae by PCR: NEGATIVE
RPR Ser Ql: NONREACTIVE

## 2023-06-10 LAB — HEMOGLOBIN A1C
Est. average glucose Bld gHb Est-mCnc: 146 mg/dL
Hgb A1c MFr Bld: 6.7 % — ABNORMAL HIGH (ref 4.8–5.6)

## 2023-06-10 LAB — MICROALBUMIN / CREATININE URINE RATIO
Creatinine, Urine: 84.9 mg/dL
Microalb/Creat Ratio: 5 mg/g{creat} (ref 0–29)
Microalbumin, Urine: 4 ug/mL

## 2023-07-02 ENCOUNTER — Other Ambulatory Visit: Payer: Self-pay | Admitting: Sports Medicine

## 2023-07-03 ENCOUNTER — Telehealth: Payer: Self-pay

## 2023-07-03 NOTE — Telephone Encounter (Signed)
 I sent 90 pills yesterday, I do not think he would have used all of that up already.

## 2023-07-03 NOTE — Telephone Encounter (Signed)
 Copied from CRM [id number]. Topic: Clinical - Medication Question >> Jul 03, 2023  2:22 PM Alfonso ORN wrote: Reason for CRM: Patient have question want to know why Dr. ONEIDA is not doing a refill for the traMADol  (ULTRAM ) 50 MG tablet , patient reach out to pharmacy and was told the medication was denied . Please reach out to patient .

## 2023-07-07 ENCOUNTER — Other Ambulatory Visit: Payer: Self-pay | Admitting: Sports Medicine

## 2023-07-07 DIAGNOSIS — E781 Pure hyperglyceridemia: Secondary | ICD-10-CM

## 2023-07-07 DIAGNOSIS — E782 Mixed hyperlipidemia: Secondary | ICD-10-CM

## 2023-07-07 DIAGNOSIS — E119 Type 2 diabetes mellitus without complications: Secondary | ICD-10-CM

## 2023-07-09 ENCOUNTER — Other Ambulatory Visit: Payer: Self-pay

## 2023-07-09 NOTE — Telephone Encounter (Signed)
 I called and confirmed that [facility] doesn't have the prescription sent of 07/02/23.   Pended prescription.

## 2023-07-09 NOTE — Telephone Encounter (Signed)
 Per E2C2 agent - patient's mother went to pick up the rx for the patient and was informed by the pharmacy that the last rx on file was 06/05/23. Contacted the pharmacy and spoke with Pharmacist Franky - confirmed that rx  sent on 07/02/23 was not on file. Last fill for the medication was 06/05/23. Verbal order provided for rx written on 07/02/23. Attempted to contact the patient/mother, no answer. Left a msg on patient's vm  regarding the updated on tramadol  rx refill.

## 2023-07-09 NOTE — Telephone Encounter (Signed)
 Copied from CRM [id number]. Topic: Clinical - Medication Question >> Jul 06, 2023  4:21 PM Franky GRADE wrote: Reason for CRM: [facility] traMADol  (ULTRAM ) 50 MG tablet; however, they told patient they do not have the prescription. Patient would like to know if there is any way to call the pharmacy and provide a verbal order.

## 2023-07-11 MED ORDER — TRAMADOL HCL 50 MG PO TABS: 50.0000 mg | ORAL_TABLET | Freq: Three times a day (TID) | ORAL | 0 refills | Status: DC | PRN

## 2023-07-11 NOTE — Telephone Encounter (Signed)
 Sent again

## 2023-07-11 NOTE — Addendum Note (Signed)
 Addended by: Monica Becton on: 07/11/2023 09:42 PM   Modules accepted: Orders

## 2023-08-04 ENCOUNTER — Other Ambulatory Visit: Payer: Self-pay | Admitting: Sports Medicine

## 2023-08-04 DIAGNOSIS — E119 Type 2 diabetes mellitus without complications: Secondary | ICD-10-CM

## 2023-08-06 ENCOUNTER — Other Ambulatory Visit (INDEPENDENT_AMBULATORY_CARE_PROVIDER_SITE_OTHER): Payer: 59

## 2023-08-06 ENCOUNTER — Encounter: Payer: Self-pay | Admitting: Sports Medicine

## 2023-08-06 ENCOUNTER — Ambulatory Visit (INDEPENDENT_AMBULATORY_CARE_PROVIDER_SITE_OTHER): Payer: 59 | Admitting: Sports Medicine

## 2023-08-06 DIAGNOSIS — M23307 Other meniscus derangements, unspecified meniscus, left knee: Secondary | ICD-10-CM | POA: Diagnosis not present

## 2023-08-06 DIAGNOSIS — L84 Corns and callosities: Secondary | ICD-10-CM | POA: Diagnosis not present

## 2023-08-06 MED ORDER — TRIAMCINOLONE ACETONIDE 40 MG/ML IJ SUSP
40.0000 mg | Freq: Once | INTRAMUSCULAR | Status: AC
  Administered 2023-08-06: 40 mg via INTRAMUSCULAR

## 2023-08-06 MED ORDER — TRAMADOL HCL 50 MG PO TABS: 50.0000 mg | ORAL_TABLET | Freq: Three times a day (TID) | ORAL | 0 refills | Status: DC | PRN

## 2023-08-06 NOTE — Progress Notes (Signed)
    Procedures performed today:    Procedure: Real-time Ultrasound Guided injection of the left knee Device: Samsung HS60  Verbal informed consent obtained.  Time-out conducted.  Noted no overlying erythema, induration, or other signs of local infection.  Skin prepped in a sterile fashion.  Local anesthesia: Topical Ethyl chloride.  With sterile technique and under real time ultrasound guidance: 1 cc Kenalog 40, 2 cc lidocaine, 2 cc bupivacaine injected easily Completed without difficulty  Advised to call if fevers/chills, erythema, induration, drainage, or persistent bleeding.  Images permanently stored and available for review in PACS.  Impression: Technically successful ultrasound guided injection.  Independent interpretation of notes and tests performed by another provider:   None.  Brief History, Exam, Impression, and Recommendations:    Degenerative tear of meniscus, left Pleasant 33 year old male, degenerative meniscal tear left knee, he had arthroscopic debridement by Dr. Bess Kinds in 2019, new MRI after failure of additional conservative treatment shows some cartilage loss and persistent meniscal tearing, he did well with a steroid injection. He is having recurrence of pain so we will repeat the steroid injection today, return to see me as needed.  Foot callus Referral to Dr. Ralene Cork per patient request    ____________________________________________ Ihor Austin. Benjamin Stain, M.D., ABFM., CAQSM., AME. Primary Care and Sports Medicine [place] [facility]  Adjunct Professor of Family Medicine  [place] [employer] [facility]  Restaurant manager, fast food

## 2023-08-06 NOTE — Assessment & Plan Note (Signed)
Referral to Dr. Ralene Cork per patient request

## 2023-08-06 NOTE — Assessment & Plan Note (Signed)
Pleasant 33 year old male, degenerative meniscal tear left knee, he had arthroscopic debridement by Dr. Bess Kinds in 2019, new MRI after failure of additional conservative treatment shows some cartilage loss and persistent meniscal tearing, he did well with a steroid injection. He is having recurrence of pain so we will repeat the steroid injection today, return to see me as needed.

## 2023-08-06 NOTE — Addendum Note (Signed)
Addended by: Carren Rang A on: 08/06/2023 04:29 PM   Modules accepted: Orders

## 2023-08-09 ENCOUNTER — Encounter: Payer: Self-pay | Admitting: Podiatry

## 2023-08-09 ENCOUNTER — Ambulatory Visit: Payer: 59 | Admitting: Podiatry

## 2023-08-09 DIAGNOSIS — M2141 Flat foot [pes planus] (acquired), right foot: Secondary | ICD-10-CM

## 2023-08-09 DIAGNOSIS — E1142 Type 2 diabetes mellitus with diabetic polyneuropathy: Secondary | ICD-10-CM | POA: Diagnosis not present

## 2023-08-09 DIAGNOSIS — M2142 Flat foot [pes planus] (acquired), left foot: Secondary | ICD-10-CM | POA: Diagnosis not present

## 2023-08-09 DIAGNOSIS — L84 Corns and callosities: Secondary | ICD-10-CM

## 2023-08-09 NOTE — Progress Notes (Signed)
  Subjective:  Patient ID: Derrick Carter, male    DOB: 12/17/1990,   MRN: [id number]  Chief Complaint  Patient presents with   Callouses     Pt presents for Callouses on both feet    33 y.o. male presents for concern of thickened painful calluses that are difficult for him to manage. Requesting to have them trimmed today. Relates burning and tingling in their feet after a long day on them. Patient is diabetic and last A1c was  Lab Results  Component Value Date   HGBA1C 6.7 (H) 06/08/2023   . He relates foot pain after 12 hours on concrete as well and wondering about supportive inserts.   PCP:  Curtis Debby PARAS, MD    . Denies any other pedal complaints. Denies n/v/f/c.   Past Medical History:  Diagnosis Date   Acanthosis 08/24/2014   ADHD (attention deficit hyperactivity disorder)    Annual physical exam 04/28/2014   Attention deficit hyperactivity disorder (ADHD) 02/04/2008   Overview:  ADHD, Combined Type  ICD-10 cut over   Last Assessment & Plan:  Relevant Hx: Course: Daily Update: Today's Plan: restart Strattera    Callus of foot 08/07/2017   Chest pain 10/30/2017   Chronic myofascial pain 03/23/2017   Chronic pain disorder 03/23/2017   Dental crown present    Diabetes mellitus without complication (HCC)    Diabetes mellitus, type 2 (HCC) 04/29/2014   Hyperlipidemia    Hypertriglyceridemia 11/19/2012   Insomnia 03/06/2014   Low back pain 08/19/2015   Migraine headache 05/19/2013   Obesity (BMI 30-39.9) 03/28/2013   Patellofemoral syndrome, bilateral 08/07/2017   Pilonidal cyst 03/2013   Pilonidal disease s/p I&D 03/10/2013 11/19/2012   Rhinitis 08/07/2017   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 CFT <3 seconds. Absent  hair growth on digits. No edema.  Skin. No lacerations or abrasions bilateral feet. Hyperkeratotic lesions  Musculoskeletal: MMT 5/5 bilateral lower  extremities in DF, PF, Inversion and Eversion. Deceased ROM in DF of ankle joint. Pes planus mild collapse of medial arch bilateral. No current pain to palpation.  Neurological: Sensation intact to light touch. Protective sensation intact bilateral.   Assessment:   1. Bilateral pes planus   2. Foot callus   3. Type 2 diabetes mellitus with diabetic polyneuropathy, without long-term current use of insulin (HCC)      Plan:  Patient was evaluated and treated and all questions answered. -Discussed and educated patient on diabetic foot care, especially with  regards to the vascular, neurological and musculoskeletal systems.  -Stressed the importance of good glycemic control and the detriment of not  controlling glucose levels in relation to the foot. -Discussed supportive shoes at all times and checking feet regularly.  -Mechanically debrided hyperkeratotic lesions sub fifth metatarsal bilateral and sub left hallux without incident with chisel.   -Discussed pes planus and calluses and custom molded orthotics and the benefit to help and prevent foot pain. Patient would like to proceed and fitted for orthotics today. Will call when get them back in.  -Answered all patient questions -Patient to return  in 3 months for at risk foot care -Patient advised to call the office if any problems or questions arise in the meantime.   Asberry Failing, DPM

## 2023-08-15 NOTE — Progress Notes (Signed)
  Orthotic order placed will schedule for fitting when in

## 2023-09-04 ENCOUNTER — Other Ambulatory Visit: Payer: Self-pay | Admitting: Sports Medicine

## 2023-09-06 ENCOUNTER — Other Ambulatory Visit: Payer: Self-pay

## 2023-09-06 ENCOUNTER — Other Ambulatory Visit: Payer: Self-pay | Admitting: Sports Medicine

## 2023-09-06 MED ORDER — TRAMADOL HCL 50 MG PO TABS: 50.0000 mg | ORAL_TABLET | Freq: Three times a day (TID) | ORAL | 0 refills | Status: DC | PRN

## 2023-09-06 NOTE — Telephone Encounter (Signed)
 Requesting rx rf of Tramadol 50mg  tablet Last written 08/06/2023 Last OV 08/06/2023 Upcoming appt = 12/10/2023

## 2023-09-06 NOTE — Telephone Encounter (Signed)
 Copied from CRM [id number]. Topic: Clinical - Medication Refill >> Sep 06, 2023  1:55 PM Shelah Lewandowsky wrote: Most Recent Primary Care Visit:  Provider: Monica Becton  Department: [facility]  Visit Type: OFFICE VISIT  Date: 08/06/2023  Medication: traMADol (ULTRAM) 50 MG tablet  Has the patient contacted their pharmacy? Yes (Agent: If no, request that the patient contact the pharmacy for the refill. If patient does not wish to contact the pharmacy document the reason why and proceed with request.) (Agent: If yes, when and what did the pharmacy advise?)  Is this the correct pharmacy for this prescription? Yes If no, delete pharmacy and type the correct one.  This is the patient's preferred pharmacy:  [id number] - [place], [place] - [address] [place] [address] Phone: [phone] Fax: [phone]   Has the prescription been filled recently? Yes  Is the patient out of the medication? Yes  Has the patient been seen for an appointment in the last year OR does the patient have an upcoming appointment? Yes  Can we respond through MyChart? No  Agent: Please be advised that Rx refills may take up to 3 business days. We ask that you follow-up with your pharmacy.

## 2023-09-06 NOTE — Telephone Encounter (Signed)
 Copied from CRM [id number]. Topic: Clinical - Prescription Issue >> Sep 06, 2023  1:57 PM Shelah Lewandowsky wrote: Reason for CRM: traMADol (ULTRAM) 50 MG tablet- pharmacy states they have not received order

## 2023-10-17 ENCOUNTER — Other Ambulatory Visit: Payer: Self-pay

## 2023-10-17 DIAGNOSIS — E782 Mixed hyperlipidemia: Secondary | ICD-10-CM

## 2023-10-17 DIAGNOSIS — E781 Pure hyperglyceridemia: Secondary | ICD-10-CM

## 2023-10-17 MED ORDER — ATORVASTATIN CALCIUM 80 MG PO TABS: 80.0000 mg | ORAL_TABLET | Freq: Every day | ORAL | 0 refills | Status: DC

## 2023-11-05 ENCOUNTER — Other Ambulatory Visit: Payer: Self-pay | Admitting: Sports Medicine

## 2023-11-05 DIAGNOSIS — G8929 Other chronic pain: Secondary | ICD-10-CM

## 2023-11-08 ENCOUNTER — Ambulatory Visit: Payer: 59 | Admitting: Podiatry

## 2023-11-19 ENCOUNTER — Other Ambulatory Visit: Payer: Self-pay | Admitting: Sports Medicine

## 2023-11-19 DIAGNOSIS — E119 Type 2 diabetes mellitus without complications: Secondary | ICD-10-CM

## 2023-11-22 ENCOUNTER — Telehealth: Payer: Self-pay

## 2023-11-22 NOTE — Telephone Encounter (Signed)
 Spoke with [facility]  Representative will fax over a request for additional diagnosis codes for the labs ordered on Jun 13, 2023 For STI panel and TSH and lipid panel.  Insurance had denied coverage under current dx codes.

## 2023-11-22 NOTE — Telephone Encounter (Signed)
 Copied from CRM [id number]. Topic: General - Other >> Nov 22, 2023  1:11 PM Retta Caster wrote: Reason for CRM: Marvene Slipper called from [facility] asking on 06/08/23 Lab results for RPR+HIV+GC+CT Panel TSH called office but while office was verifying with nurse she disconnected. Instructed to send to Clinical from office. [phone]

## 2023-11-26 ENCOUNTER — Other Ambulatory Visit: Payer: Self-pay | Admitting: Sports Medicine

## 2023-11-26 DIAGNOSIS — E119 Type 2 diabetes mellitus without complications: Secondary | ICD-10-CM

## 2023-12-10 ENCOUNTER — Ambulatory Visit: Payer: BC Managed Care – PPO | Admitting: Sports Medicine

## 2023-12-13 ENCOUNTER — Ambulatory Visit: Admitting: Podiatry

## 2024-01-11 ENCOUNTER — Ambulatory Visit: Admitting: Podiatry

## 2024-01-11 ENCOUNTER — Encounter: Payer: Self-pay | Admitting: Podiatry

## 2024-01-11 DIAGNOSIS — E1142 Type 2 diabetes mellitus with diabetic polyneuropathy: Secondary | ICD-10-CM

## 2024-01-11 DIAGNOSIS — L84 Corns and callosities: Secondary | ICD-10-CM

## 2024-01-11 NOTE — Progress Notes (Signed)
  Subjective:  Patient ID: Derrick Carter, male    DOB: [birth year],   MRN: [id number]  Chief Complaint  Patient presents with   Diabetes    Trim my calluses.    33 y.o. male presents for concern of thickened painful calluses that are difficult for him to manage. Requesting to have them trimmed today. Relates burning and tingling in their feet after a long day on them. Patient is diabetic and last A1c was  Lab Results  Component Value Date   HGBA1C 6.7 (H) 06/08/2023   . He relates foot pain after 12 hours on concrete as well and wondering about supportive inserts.   PCP:  Curtis Debby PARAS, MD    . Denies any other pedal complaints. Denies n/v/f/c.   Past Medical History:  Diagnosis Date   Acanthosis 08/24/2014   ADHD (attention deficit hyperactivity disorder)    Annual physical exam 04/28/2014   Attention deficit hyperactivity disorder (ADHD) 02/04/2008   Overview:  ADHD, Combined Type  ICD-10 cut over   Last Assessment & Plan:  Relevant Hx: Course: Daily Update: Today's Plan: restart Strattera    Callus of foot 08/07/2017   Chest pain 10/30/2017   Chronic myofascial pain 03/23/2017   Chronic pain disorder 03/23/2017   Dental crown present    Diabetes mellitus without complication (HCC)    Diabetes mellitus, type 2 (HCC) 04/29/2014   Hyperlipidemia    Hypertriglyceridemia 11/19/2012   Insomnia 03/06/2014   Low back pain 08/19/2015   Migraine headache 05/19/2013   Obesity (BMI 30-39.9) 03/28/2013   Patellofemoral syndrome, bilateral 08/07/2017   Pilonidal cyst 03/2013   Pilonidal disease s/p I&D 03/10/2013 11/19/2012   Rhinitis 08/07/2017   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 CFT <3 seconds. Absent  hair growth on digits. No edema.  Skin. No lacerations or abrasions bilateral feet. Hyperkeratotic lesions noted to sub fifth metatarsal head bilateral and sub left  hallux.  Musculoskeletal: MMT 5/5 bilateral lower extremities in DF, PF, Inversion and Eversion. Deceased ROM in DF of ankle joint. Pes planus mild collapse of medial arch bilateral. No current pain to palpation.  Neurological: Sensation intact to light touch. Protective sensation intact bilateral.   Assessment:   1. Foot callus   2. Type 2 diabetes mellitus with diabetic polyneuropathy, without long-term current use of insulin (HCC)       Plan:  Patient was evaluated and treated and all questions answered. -Discussed and educated patient on diabetic foot care, especially with  regards to the vascular, neurological and musculoskeletal systems.  -Stressed the importance of good glycemic control and the detriment of not  controlling glucose levels in relation to the foot. -Discussed supportive shoes at all times and checking feet regularly.  -Mechanically debrided hyperkeratotic lesions sub fifth metatarsal bilateral and sub left hallux without incident with chisel.   -Discussed pes planus and calluses. CMO dispensed today and will see how these help.  -Answered all patient questions -Patient to return  in 3 months for at risk foot care -Patient advised to call the office if any problems or questions arise in the meantime.   Asberry Failing, DPM

## 2024-01-12 ENCOUNTER — Other Ambulatory Visit: Payer: Self-pay | Admitting: Sports Medicine

## 2024-01-12 DIAGNOSIS — E781 Pure hyperglyceridemia: Secondary | ICD-10-CM

## 2024-01-12 DIAGNOSIS — E782 Mixed hyperlipidemia: Secondary | ICD-10-CM

## 2024-02-01 ENCOUNTER — Ambulatory Visit (INDEPENDENT_AMBULATORY_CARE_PROVIDER_SITE_OTHER): Admitting: Sports Medicine

## 2024-02-01 VITALS — BP 127/79 | HR 90 | Ht 71.0 in | Wt 200.0 lb

## 2024-02-01 DIAGNOSIS — Z7984 Long term (current) use of oral hypoglycemic drugs: Secondary | ICD-10-CM

## 2024-02-01 DIAGNOSIS — Z0000 Encounter for general adult medical examination without abnormal findings: Secondary | ICD-10-CM | POA: Diagnosis not present

## 2024-02-01 DIAGNOSIS — E1142 Type 2 diabetes mellitus with diabetic polyneuropathy: Secondary | ICD-10-CM

## 2024-02-01 DIAGNOSIS — Z113 Encounter for screening for infections with a predominantly sexual mode of transmission: Secondary | ICD-10-CM

## 2024-02-01 NOTE — Assessment & Plan Note (Signed)
 Annual physical as above, his last physical was in December so we will cover this as a regular office visit. Adding routine labs.

## 2024-02-01 NOTE — Progress Notes (Signed)
 Subjective:    CC: Annual Physical Exam  HPI:  This patient is here for their annual physical  I reviewed the past medical history, family history, social history, surgical history, and allergies today and no changes were needed.  Please see the problem list section below in epic for further details.  Past Medical History: Past Medical History:  Diagnosis Date   Acanthosis 08/24/2014   ADHD (attention deficit hyperactivity disorder)    Annual physical exam 04/28/2014   Attention deficit hyperactivity disorder (ADHD) 02/04/2008   Overview:  ADHD, Combined Type  ICD-10 cut over   Last Assessment & Plan:  Relevant Hx: Course: Daily Update: Today's Plan: restart Strattera    Callus of foot 08/07/2017   Chest pain 10/30/2017   Chronic myofascial pain 03/23/2017   Chronic pain disorder 03/23/2017   Dental crown present    Diabetes mellitus without complication (HCC)    Diabetes mellitus, type 2 (HCC) 04/29/2014   Hyperlipidemia    Hypertriglyceridemia 11/19/2012   Insomnia 03/06/2014   Low back pain 08/19/2015   Migraine headache 05/19/2013   Obesity (BMI 30-39.9) 03/28/2013   Patellofemoral syndrome, bilateral 08/07/2017   Pilonidal cyst 03/2013   Pilonidal disease s/p I&D 03/10/2013 11/19/2012   Rhinitis 08/07/2017   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   Thoracic spondylosis without myelopathy 03/23/2017   Past Surgical History: Past Surgical History:  Procedure Laterality Date   KNEE ARTHROSCOPY W/ MENISCAL REPAIR Left 06/11/2018   LEFT HEART CATH AND CORONARY ANGIOGRAPHY N/A 12/24/2017   Procedure: LEFT HEART CATH AND CORONARY ANGIOGRAPHY;  Surgeon: Mady Bruckner, MD;  Location: [facility];  Service: Cardiovascular;  Laterality: N/A;   NO PAST SURGERIES     PILONIDAL CYST EXCISION N/A 03/10/2013   Procedure: CYST EXCISION PILONIDAL SIMPLE I and D ;  Surgeon: Deward GORMAN Curvin DOUGLAS, MD;  Location: [facility];  Service: General;  Laterality: N/A;   Social History: Social  History   Socioeconomic History   Marital status: Single    Spouse name: Not on file   Number of children: Not on file   Years of education: Not on file   Highest education level: Not on file  Occupational History   Not on file  Tobacco Use   Smoking status: Never   Smokeless tobacco: Never  Vaping Use   Vaping status: Never Used  Substance and Sexual Activity   Alcohol use: No   Drug use: No   Sexual activity: Never  Other Topics Concern   Not on file  Social History Narrative   Not on file   Social Drivers of Health   Financial Resource Strain: Not on file  Food Insecurity: No Food Insecurity (07/28/2021)   [facility] from [facility]   Hunger Vital Sign    Within the past 12 months, you worried that your food would run out before you got the money to buy more.: Never true    Within the past 12 months, the food you bought just didn't last and you didn't have money to get more.: Never true  Transportation Needs: Not on file  Physical Activity: Not on file  Stress: Not on file  Social Connections: Unknown (11/01/2021)   [facility] from [facility]   Social Network    Social Network: Not on file   Family History: Family History  Problem Relation Age of Onset   Hyperlipidemia Mother    Heart disease Mother    Diabetes Mother    Hypertension Mother  Diabetes Maternal Grandmother    Hyperlipidemia Maternal Grandmother    Hyperlipidemia Maternal Grandfather    Allergies: No Known Allergies Medications: See med rec.  Review of Systems: No headache, visual changes, nausea, vomiting, diarrhea, constipation, dizziness, abdominal pain, skin rash, fevers, chills, night sweats, swollen lymph nodes, weight loss, chest pain, body aches, joint swelling, muscle aches, shortness of breath, mood changes, visual or auditory hallucinations.  Objective:    General: Well Developed, well nourished, and in no acute distress.  Neuro: Alert and oriented x3, extra-ocular muscles  intact, sensation grossly intact. Cranial nerves II through XII are intact, motor, sensory, and coordinative functions are all intact. HEENT: Normocephalic, atraumatic, pupils equal round reactive to light, neck supple, no masses, no lymphadenopathy, thyroid nonpalpable. Oropharynx, nasopharynx, external ear canals are unremarkable. Skin: Warm and dry, no rashes noted.  Cardiac: Regular rate and rhythm, no murmurs rubs or gallops.  Respiratory: Clear to auscultation bilaterally. Not using accessory muscles, speaking in full sentences.  Abdominal: Soft, nontender, nondistended, positive bowel sounds, no masses, no organomegaly.  Musculoskeletal: Shoulder, elbow, wrist, hip, knee, ankle stable, and with full range of motion.  Impression and Recommendations:    The patient was counselled, risk factors were discussed, anticipatory guidance given.  Annual physical exam Annual physical as above, his last physical was in December so we will cover this as a regular office visit. Adding routine labs.   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  I spent 30 minutes of total time managing this patient today, this includes chart review, face to face, and non-face to face time.  ____________________________________________ Debby PARAS. Curtis, M.D., ABFM., CAQSM., AME. Primary Care and Sports Medicine [facility]  Adjunct Professor of [facility] Medicine  [employer]  Restaurant manager, fast food

## 2024-02-01 NOTE — Assessment & Plan Note (Signed)
 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

## 2024-02-02 ENCOUNTER — Ambulatory Visit: Payer: Self-pay | Admitting: Sports Medicine

## 2024-02-02 LAB — ACUTE VIRAL HEPATITIS (HAV, HBV, HCV)
HCV Ab: NONREACTIVE
Hep A IgM: NEGATIVE
Hep B C IgM: NEGATIVE
Hepatitis B Surface Ag: NEGATIVE

## 2024-02-02 LAB — TSH: TSH: 3.57 u[IU]/mL (ref 0.450–4.500)

## 2024-02-02 LAB — COMPREHENSIVE METABOLIC PANEL WITH GFR
ALT: 37 IU/L (ref 0–44)
AST: 22 IU/L (ref 0–40)
Albumin: 4.6 g/dL (ref 4.1–5.1)
Alkaline Phosphatase: 75 IU/L (ref 44–121)
BUN/Creatinine Ratio: 14 (ref 9–20)
BUN: 12 mg/dL (ref 6–20)
Bilirubin Total: 1.1 mg/dL (ref 0.0–1.2)
CO2: 19 mmol/L — ABNORMAL LOW (ref 20–29)
Calcium: 9.4 mg/dL (ref 8.7–10.2)
Chloride: 101 mmol/L (ref 96–106)
Creatinine, Ser: 0.83 mg/dL (ref 0.76–1.27)
Globulin, Total: 2.7 g/dL (ref 1.5–4.5)
Glucose: 159 mg/dL — ABNORMAL HIGH (ref 70–99)
Potassium: 4.1 mmol/L (ref 3.5–5.2)
Sodium: 139 mmol/L (ref 134–144)
Total Protein: 7.3 g/dL (ref 6.0–8.5)
eGFR: 120 mL/min/1.73 (ref >59)

## 2024-02-02 LAB — HSV-2 AB, IGG: HSV 2 IgG, Type Spec: NONREACTIVE

## 2024-02-02 LAB — CBC
Hematocrit: 51.1 % — ABNORMAL HIGH (ref 37.5–51.0)
Hemoglobin: 16.7 g/dL (ref 13.0–17.7)
MCH: 30.3 pg (ref 26.6–33.0)
MCHC: 32.7 g/dL (ref 31.5–35.7)
MCV: 93 fL (ref 79–97)
Platelets: 291 x10E3/uL (ref 150–450)
RBC: 5.52 x10E6/uL (ref 4.14–5.80)
RDW: 12.4 % (ref 11.6–15.4)
WBC: 11.8 x10E3/uL — ABNORMAL HIGH (ref 3.4–10.8)

## 2024-02-02 LAB — HEMOGLOBIN A1C
Est. average glucose Bld gHb Est-mCnc: 169 mg/dL
Hgb A1c MFr Bld: 7.5 % — ABNORMAL HIGH (ref 4.8–5.6)

## 2024-02-02 LAB — RPR: RPR Ser Ql: NONREACTIVE

## 2024-02-02 LAB — LIPID PANEL
Chol/HDL Ratio: 4.7 ratio (ref 0.0–5.0)
Cholesterol, Total: 132 mg/dL (ref 100–199)
HDL: 28 mg/dL — ABNORMAL LOW (ref >39)
LDL Chol Calc (NIH): 72 mg/dL (ref 0–99)
Triglycerides: 186 mg/dL — ABNORMAL HIGH (ref 0–149)
VLDL Cholesterol Cal: 32 mg/dL (ref 5–40)

## 2024-02-02 LAB — HCV INTERPRETATION

## 2024-02-02 LAB — HIV ANTIBODY (ROUTINE TESTING W REFLEX): HIV Screen 4th Generation wRfx: NONREACTIVE

## 2024-02-11 NOTE — Telephone Encounter (Signed)
 Patient mother informed of results.

## 2024-02-14 ENCOUNTER — Other Ambulatory Visit: Payer: Self-pay | Admitting: Sports Medicine

## 2024-02-14 DIAGNOSIS — E119 Type 2 diabetes mellitus without complications: Secondary | ICD-10-CM

## 2024-02-20 ENCOUNTER — Other Ambulatory Visit: Payer: Self-pay | Admitting: Sports Medicine

## 2024-02-20 DIAGNOSIS — G8929 Other chronic pain: Secondary | ICD-10-CM

## 2024-02-26 ENCOUNTER — Other Ambulatory Visit: Payer: Self-pay

## 2024-02-26 DIAGNOSIS — E119 Type 2 diabetes mellitus without complications: Secondary | ICD-10-CM

## 2024-02-26 DIAGNOSIS — Z113 Encounter for screening for infections with a predominantly sexual mode of transmission: Secondary | ICD-10-CM

## 2024-02-28 ENCOUNTER — Ambulatory Visit: Payer: Self-pay | Admitting: Family Medicine

## 2024-02-28 LAB — CT/GC/TV NAA+MYCOPLASMAS URINE

## 2024-02-28 LAB — MICROALBUMIN / CREATININE URINE RATIO
Creatinine, Urine: 184.4 mg/dL
Microalb/Creat Ratio: 8 mg/g{creat} (ref 0–29)
Microalbumin, Urine: 14.6 ug/mL

## 2024-02-28 NOTE — Progress Notes (Signed)
 Hi Derrick Carter, your urine sample came back negative for gonorrhea chlamydia and trichomonas.  No excess protein in the urine which is great.

## 2024-03-04 ENCOUNTER — Encounter: Payer: Self-pay | Admitting: Sports Medicine

## 2024-03-18 ENCOUNTER — Other Ambulatory Visit: Payer: Self-pay

## 2024-03-18 DIAGNOSIS — E119 Type 2 diabetes mellitus without complications: Secondary | ICD-10-CM

## 2024-03-18 MED ORDER — SITAGLIPTIN PHOSPHATE 100 MG PO TABS: 100.0000 mg | ORAL_TABLET | Freq: Every day | ORAL | 3 refills | Status: DC

## 2024-05-07 ENCOUNTER — Other Ambulatory Visit: Payer: Self-pay

## 2024-05-09 MED ORDER — DAPAGLIFLOZIN PRO-METFORMIN ER 10-1000 MG PO TB24: 1.0000 | ORAL_TABLET | Freq: Every day | ORAL | 0 refills | Status: DC

## 2024-05-15 ENCOUNTER — Ambulatory Visit: Admitting: Podiatry

## 2024-05-15 ENCOUNTER — Encounter: Payer: Self-pay | Admitting: Podiatry

## 2024-05-15 DIAGNOSIS — E1142 Type 2 diabetes mellitus with diabetic polyneuropathy: Secondary | ICD-10-CM

## 2024-05-15 DIAGNOSIS — L84 Corns and callosities: Secondary | ICD-10-CM | POA: Diagnosis not present

## 2024-05-15 NOTE — Progress Notes (Signed)
  Subjective:  Patient ID: Derrick Carter, male    DOB: February 23, 1991,   MRN: [id number]  Chief Complaint  Patient presents with   Diabetes    Trim my calluses.  When they grow, they get harder and harder.  They start to hurt after they grow.  Saw Dr. Curtis - 02/01/2024; A1c - 7.5    33 y.o. male presents for concern of thickened painful calluses that are difficult for him to manage. Requesting to have them trimmed today. Relates burning and tingling in their feet after a long day on them. Patient is diabetic and last A1c was  Lab Results  Component Value Date   HGBA1C 7.5 (H) 02/01/2024    PCP:  Curtis Debby PARAS, MD    . Denies any other pedal complaints. Denies n/v/f/c.   Past Medical History:  Diagnosis Date   Acanthosis 08/24/2014   ADHD (attention deficit hyperactivity disorder)    Annual physical exam 04/28/2014   Attention deficit hyperactivity disorder (ADHD) 02/04/2008   Overview:  ADHD, Combined Type  ICD-10 cut over   Last Assessment & Plan:  Relevant Hx: Course: Daily Update: Today's Plan: restart Strattera    Callus of foot 08/07/2017   Chest pain 10/30/2017   Chronic myofascial pain 03/23/2017   Chronic pain disorder 03/23/2017   Dental crown present    Diabetes mellitus without complication (HCC)    Diabetes mellitus, type 2 (HCC) 04/29/2014   Hyperlipidemia    Hypertriglyceridemia 11/19/2012   Insomnia 03/06/2014   Low back pain 08/19/2015   Migraine headache 05/19/2013   Obesity (BMI 30-39.9) 03/28/2013   Patellofemoral syndrome, bilateral 08/07/2017   Pilonidal cyst 03/2013   Pilonidal disease s/p I&D 03/10/2013 11/19/2012   Rhinitis 08/07/2017   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 CFT <3 seconds. Absent  hair growth on digits. No edema.  Skin. No lacerations or abrasions bilateral feet. Hyperkeratotic lesions noted to sub fifth metatarsal head  bilateral and sub left hallux.  Musculoskeletal: MMT 5/5 bilateral lower extremities in DF, PF, Inversion and Eversion. Deceased ROM in DF of ankle joint. Pes planus mild collapse of medial arch bilateral. No current pain to palpation.  Neurological: Sensation intact to light touch. Protective sensation intact bilateral.   Assessment:   1. Foot callus   2. Type 2 diabetes mellitus with diabetic polyneuropathy, without long-term current use of insulin (HCC)        Plan:  Patient was evaluated and treated and all questions answered. -Discussed and educated patient on diabetic foot care, especially with  regards to the vascular, neurological and musculoskeletal systems.  -Stressed the importance of good glycemic control and the detriment of not  controlling glucose levels in relation to the foot. -Discussed supportive shoes at all times and checking feet regularly.  -Mechanically debrided hyperkeratotic lesions sub fifth metatarsal bilateral and sub left hallux without incident with chisel.   -Continue Cmo.  -Answered all patient questions -Patient to return  in 3 months for at risk foot care -Patient advised to call the office if any problems or questions arise in the meantime.   Asberry Failing, DPM

## 2024-05-16 ENCOUNTER — Other Ambulatory Visit: Payer: Self-pay

## 2024-05-16 DIAGNOSIS — E119 Type 2 diabetes mellitus without complications: Secondary | ICD-10-CM

## 2024-05-16 MED ORDER — GLIPIZIDE 10 MG PO TABS: 10.0000 mg | ORAL_TABLET | Freq: Two times a day (BID) | ORAL | 0 refills | Status: AC

## 2024-07-10 ENCOUNTER — Other Ambulatory Visit: Payer: Self-pay

## 2024-07-10 DIAGNOSIS — E782 Mixed hyperlipidemia: Secondary | ICD-10-CM

## 2024-07-10 DIAGNOSIS — E781 Pure hyperglyceridemia: Secondary | ICD-10-CM

## 2024-07-10 MED ORDER — ATORVASTATIN CALCIUM 80 MG PO TABS: 80.0000 mg | ORAL_TABLET | Freq: Every day | ORAL | 0 refills | Status: AC

## 2024-07-13 ENCOUNTER — Other Ambulatory Visit: Payer: Self-pay | Admitting: Family Medicine

## 2024-07-13 DIAGNOSIS — E119 Type 2 diabetes mellitus without complications: Secondary | ICD-10-CM

## 2024-08-03 ENCOUNTER — Other Ambulatory Visit: Payer: Self-pay | Admitting: Family Medicine

## 2024-08-03 DIAGNOSIS — E119 Type 2 diabetes mellitus without complications: Secondary | ICD-10-CM

## 2024-08-04 NOTE — Telephone Encounter (Signed)
 Pls contact pt to establish with new PCP. Sending 30 day refill. No additional. Thx.

## 2024-08-14 ENCOUNTER — Ambulatory Visit: Admitting: Podiatry
